# Patient Record
Sex: Male | Born: 1940 | Race: White | Hispanic: No | Marital: Married | Smoking: Never smoker
Health system: Southern US, Community
[De-identification: ages and names within clinical notes are randomized; demographics above are authoritative.]

## PROBLEM LIST (undated history)

## (undated) DIAGNOSIS — G459 Transient cerebral ischemic attack, unspecified: Secondary | ICD-10-CM

## (undated) DIAGNOSIS — I10 Essential (primary) hypertension: Secondary | ICD-10-CM

## (undated) DIAGNOSIS — E119 Type 2 diabetes mellitus without complications: Secondary | ICD-10-CM

## (undated) DIAGNOSIS — I2510 Atherosclerotic heart disease of native coronary artery without angina pectoris: Secondary | ICD-10-CM

## (undated) DIAGNOSIS — E785 Hyperlipidemia, unspecified: Secondary | ICD-10-CM

## (undated) HISTORY — PX: TOE AMPUTATION: SHX809

---
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
## 2004-09-23 DIAGNOSIS — I2510 Atherosclerotic heart disease of native coronary artery without angina pectoris: Secondary | ICD-10-CM

## 2004-09-23 HISTORY — DX: Atherosclerotic heart disease of native coronary artery without angina pectoris: I25.10

## 2004-09-23 HISTORY — PX: BYPASS GRAFT: SHX909

## 2016-07-18 DIAGNOSIS — Z9181 History of falling: Secondary | ICD-10-CM | POA: Insufficient documentation

## 2016-07-18 DIAGNOSIS — E11628 Type 2 diabetes mellitus with other skin complications: Secondary | ICD-10-CM | POA: Insufficient documentation

## 2016-07-18 DIAGNOSIS — E039 Hypothyroidism, unspecified: Secondary | ICD-10-CM | POA: Insufficient documentation

## 2016-07-18 DIAGNOSIS — I10 Essential (primary) hypertension: Secondary | ICD-10-CM | POA: Insufficient documentation

## 2016-07-18 DIAGNOSIS — R202 Paresthesia of skin: Secondary | ICD-10-CM | POA: Insufficient documentation

## 2016-07-18 DIAGNOSIS — E1140 Type 2 diabetes mellitus with diabetic neuropathy, unspecified: Secondary | ICD-10-CM | POA: Insufficient documentation

## 2016-07-18 DIAGNOSIS — Z794 Long term (current) use of insulin: Secondary | ICD-10-CM

## 2016-07-18 DIAGNOSIS — E785 Hyperlipidemia, unspecified: Secondary | ICD-10-CM | POA: Insufficient documentation

## 2016-07-18 DIAGNOSIS — E11319 Type 2 diabetes mellitus with unspecified diabetic retinopathy without macular edema: Secondary | ICD-10-CM

## 2016-07-18 DIAGNOSIS — E119 Type 2 diabetes mellitus without complications: Secondary | ICD-10-CM | POA: Insufficient documentation

## 2016-07-18 DIAGNOSIS — I25810 Atherosclerosis of coronary artery bypass graft(s) without angina pectoris: Secondary | ICD-10-CM | POA: Insufficient documentation

## 2016-08-07 DIAGNOSIS — G5600 Carpal tunnel syndrome, unspecified upper limb: Secondary | ICD-10-CM | POA: Insufficient documentation

## 2016-10-29 DIAGNOSIS — Z7409 Other reduced mobility: Secondary | ICD-10-CM | POA: Insufficient documentation

## 2016-10-29 DIAGNOSIS — R413 Other amnesia: Secondary | ICD-10-CM | POA: Insufficient documentation

## 2016-10-29 DIAGNOSIS — R251 Tremor, unspecified: Secondary | ICD-10-CM | POA: Insufficient documentation

## 2016-10-29 DIAGNOSIS — R1310 Dysphagia, unspecified: Secondary | ICD-10-CM | POA: Insufficient documentation

## 2016-10-29 DIAGNOSIS — M25551 Pain in right hip: Secondary | ICD-10-CM | POA: Insufficient documentation

## 2016-11-18 ENCOUNTER — Ambulatory Visit (INDEPENDENT_AMBULATORY_CARE_PROVIDER_SITE_OTHER): Payer: Medicare Other | Admitting: Podiatry

## 2016-11-18 ENCOUNTER — Encounter: Payer: Self-pay | Admitting: Podiatry

## 2016-11-18 DIAGNOSIS — B351 Tinea unguium: Secondary | ICD-10-CM | POA: Diagnosis not present

## 2016-11-18 DIAGNOSIS — E1149 Type 2 diabetes mellitus with other diabetic neurological complication: Secondary | ICD-10-CM | POA: Diagnosis not present

## 2016-11-18 DIAGNOSIS — M129 Arthropathy, unspecified: Secondary | ICD-10-CM | POA: Diagnosis not present

## 2016-11-18 DIAGNOSIS — M205X9 Other deformities of toe(s) (acquired), unspecified foot: Secondary | ICD-10-CM

## 2016-11-18 DIAGNOSIS — M79676 Pain in unspecified toe(s): Secondary | ICD-10-CM | POA: Diagnosis not present

## 2016-11-18 DIAGNOSIS — M2020 Hallux rigidus, unspecified foot: Secondary | ICD-10-CM

## 2016-11-18 NOTE — Progress Notes (Signed)
This patient presents the office with chief complaint of painful long nails. Patient has moved from [place] and has not been seen by his podiatrist for 6 months. He says that he is unable to work on the nails himself. 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 He also is interested in inquiring about diabetic footgear   GENERAL APPEARANCE: Alert, conversant. Appropriately groomed. No acute distress.  VASCULAR: [facility]  [facility]  [facility] but absent left foot.  Capillary refill time is immediate to all digits,  Normal temperature gradient.   NEUROLOGIC: sensation is absent  to 5.07 monofilament at 5/5 sites bilateral.  Light touch is intact bilateral, Muscle strength normal.  MUSCULOSKELETAL: acceptable muscle strength, tone and stability bilateral.  Intrinsic muscluature intact bilateral.  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  DERMATOLOGIC: skin color, texture, and turgor are within normal limits.  No preulcerative lesions or ulcers  are seen, no interdigital maceration noted.  No open lesions present.  . No drainage noted.  NAILS  Thick disfigured discolored nails both feet.  Onychomycosis  B/L  Diabetes with neuropathy  Hallux Limitus  B/L  Foot arthritis left foot.   IE  Debridement of nails.  Initiate diabetic shoe paperwork.  Patient needs filler or wedge on left orthoses.  RTC 3 months.   Helane GuntherGregory Lorriane Dehart DPM

## 2017-01-03 ENCOUNTER — Telehealth: Payer: Self-pay | Admitting: *Deleted

## 2017-01-03 NOTE — Telephone Encounter (Signed)
Pt states Dr. Stacie Acres spoke to him concerning a man in the office that would help him with diabetic shoes, but he has not heard from anyone.

## 2017-03-10 ENCOUNTER — Ambulatory Visit (INDEPENDENT_AMBULATORY_CARE_PROVIDER_SITE_OTHER): Payer: Medicare Other | Admitting: Podiatry

## 2017-03-10 ENCOUNTER — Encounter: Payer: Self-pay | Admitting: Podiatry

## 2017-03-10 DIAGNOSIS — B351 Tinea unguium: Secondary | ICD-10-CM

## 2017-03-10 DIAGNOSIS — M2020 Hallux rigidus, unspecified foot: Secondary | ICD-10-CM

## 2017-03-10 DIAGNOSIS — M79676 Pain in unspecified toe(s): Secondary | ICD-10-CM | POA: Diagnosis not present

## 2017-03-10 DIAGNOSIS — E1149 Type 2 diabetes mellitus with other diabetic neurological complication: Secondary | ICD-10-CM

## 2017-03-10 DIAGNOSIS — M205X9 Other deformities of toe(s) (acquired), unspecified foot: Secondary | ICD-10-CM

## 2017-03-10 DIAGNOSIS — M129 Arthropathy, unspecified: Secondary | ICD-10-CM

## 2017-03-10 NOTE — Progress Notes (Signed)
This patient presents the office with chief complaint of painful long nails. Patient has moved from [place] and has not been seen by his podiatrist for 6 months. He says that he is unable to work on the nails himself. 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 He also is interested in inquiring about diabetic footgear   GENERAL APPEARANCE: Alert, conversant. Appropriately groomed. No acute distress.  VASCULAR: Pedal [facility]  [facility]  [facility] but absent left foot.  Capillary refill time is immediate to all digits,  Normal temperature gradient.   NEUROLOGIC: sensation is absent  to 5.07 monofilament at 5/5 sites bilateral.  Light touch is intact bilateral, Muscle strength normal.  MUSCULOSKELETAL: acceptable muscle strength, tone and stability bilateral.  Intrinsic muscluature intact bilateral.  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  DERMATOLOGIC: skin color, texture, and turgor are within normal limits.  No preulcerative lesions or ulcers  are seen, no interdigital maceration noted.  No open lesions present.  . No drainage noted.  NAILS  Thick disfigured discolored nails both feet.  Onychomycosis  B/L  Diabetes with neuropathy  Hallux Limitus  B/L  Foot arthritis left foot.   IE  Debridement of nails. Re-  Initiate diabetic shoe paperwork.  Patient needs filler or wedge on left orthoses.  RTC 3 months.   Helane GuntherGregory Presley Gora DPM

## 2017-05-05 ENCOUNTER — Telehealth: Payer: Self-pay | Admitting: Podiatry

## 2017-05-05 NOTE — Telephone Encounter (Signed)
Per voicemail from pt [phone] @ 337pm pt calling to see if his diabetic shoes are in. Please call pt and advise

## 2017-05-08 ENCOUNTER — Ambulatory Visit (INDEPENDENT_AMBULATORY_CARE_PROVIDER_SITE_OTHER): Payer: Medicare Other | Admitting: Podiatry

## 2017-05-08 ENCOUNTER — Encounter: Payer: Self-pay | Admitting: Podiatry

## 2017-05-08 DIAGNOSIS — E1149 Type 2 diabetes mellitus with other diabetic neurological complication: Secondary | ICD-10-CM

## 2017-05-08 DIAGNOSIS — M205X9 Other deformities of toe(s) (acquired), unspecified foot: Secondary | ICD-10-CM

## 2017-05-08 DIAGNOSIS — M2020 Hallux rigidus, unspecified foot: Secondary | ICD-10-CM

## 2017-05-08 NOTE — Progress Notes (Signed)
Patient ID: Chris [name], male   DOB: May 30, 1941, 76 y.o.   MRN: [id number]   Patient presents for diabetic shoe pick up, shoes are tried on for good fit.   Patient is requesting an outer heel wedge on the left shoe.  [facility]  This patient presents to the office to pick up his diabetic shoes.   GENERAL APPEARANCE: Alert, conversant. Appropriately groomed. No acute distress.  VASCULAR: Pedal pulses are  [facility]  [facility].  Capillary refill time is immediate to all digits,  Normal temperature gradient.  Digital hair growth is present bilateral  NEUROLOGIC: sensation is absent  to 5.07 monofilament at 5/5 sites bilateral.  Light touch is absent  bilateral, Muscle strength normal.  MUSCULOSKELETAL: acceptable muscle strength, tone and stability bilateral.  Intrinsic muscluature intact bilateral.  Hallux limitus 1st MPJ  B/L.  Liz-Frank deformity. NAILS  Thick disfigured discolored nails both feet. DERMATOLOGIC: skin color, texture, and turgor are within normal limits.  No preulcerative lesions or ulcers  are seen, no interdigital maceration noted.  No open lesions present.  Digital nails are asymptomatic. No drainage noted.   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  Patient was brought into the office and the shoes fit well.  He desires an outer heel wedge to be applied to one of his shoes.  We will discuss this with Raiford Nobleick.  Patient be reexamined in one week.   Helane GuntherGregory Euleta Belson DPM

## 2017-05-08 NOTE — Patient Instructions (Signed)

## 2017-06-09 ENCOUNTER — Ambulatory Visit (INDEPENDENT_AMBULATORY_CARE_PROVIDER_SITE_OTHER): Payer: Medicare Other | Admitting: Podiatry

## 2017-06-09 ENCOUNTER — Encounter: Payer: Self-pay | Admitting: Podiatry

## 2017-06-09 ENCOUNTER — Ambulatory Visit (INDEPENDENT_AMBULATORY_CARE_PROVIDER_SITE_OTHER): Payer: Medicare Other

## 2017-06-09 DIAGNOSIS — E08621 Diabetes mellitus due to underlying condition with foot ulcer: Secondary | ICD-10-CM

## 2017-06-09 DIAGNOSIS — L97421 Non-pressure chronic ulcer of left heel and midfoot limited to breakdown of skin: Secondary | ICD-10-CM | POA: Diagnosis not present

## 2017-06-09 DIAGNOSIS — E1142 Type 2 diabetes mellitus with diabetic polyneuropathy: Secondary | ICD-10-CM

## 2017-06-09 DIAGNOSIS — L97521 Non-pressure chronic ulcer of other part of left foot limited to breakdown of skin: Secondary | ICD-10-CM | POA: Diagnosis not present

## 2017-06-09 MED ORDER — CEPHALEXIN 500 MG PO CAPS: 500.0000 mg | ORAL_CAPSULE | Freq: Two times a day (BID) | ORAL | 2 refills | Status: DC

## 2017-06-09 NOTE — Progress Notes (Addendum)
This patient presents the office with chief complaint of a newly developed ulcer on the bottom of his left foot.  This area is under the fifth meta-base left foot.  He says this is a new wound that has been present for approximately a week and has order and slight drainage.  He presents the office today walking with a bandage on his left foot.  He gives a history of having an infection years ago when he was in [place].  This is led to multiple surgeries and he states that part of his fifth metatarsal bone was removed.  He says since that surgery he has had a breakdown of the skin under the fifth meta-base, which is taken months to years to close.  He says he was even treated at the wound care center.  He presents the office today for an evaluation of this newly developed diabetic ulcer.  GENERAL APPEARANCE: Alert, conversant. Appropriately groomed. No acute distress.  VASCULAR: Pedal pulses are  palpable at  [facility] and PT bilateral.  Capillary refill time is immediate to all digits,  Normal temperature gradient.  Digital hair growth is present bilateral  NEUROLOGIC: sensation is absent  to 5.07 monofilament at 5/5 sites bilateral.  Light touch is intact bilateral, Muscle strength normal.  MUSCULOSKELETAL: acceptable muscle strength, tone and stability bilateral.  Intrinsic muscluature intact bilateral.  Hallux limitus first MPJ bilaterally.  Marisue Ivan frank deformity. Ulcer  patient has a 10 mm x 15 mm ulcer, sub-fifth metatarsal left foot.  This ulcer is encircled by white necrotic tissue.  No evidence of any redness, swelling or drainage from the ulcer site. DERMATOLOGIC: skin color, texture, and turgor are within normal limits.  No preulcerative lesions or ulcers  are seen, no interdigital maceration noted.  No open lesions present.  Digital nails are asymptomatic. No drainage noted.  Diabetic ulcer left foot.  Diabetic neuropathy   ROV  debridement of necrotic tissue at the diabetic ulcer site was performed.   Silvadene dry sterile dressing was applied.  X-ray was taken which reveal the absence of the distal shaft fifth metatarsal in the absence of the fourth metatarsal head.   No evidence of any bony changes indicating osteomyelitis.  Calcification at insertion plantar fascia and achilles tendon.   Midfoot arthritic changes. Patient normally would be told to return to the office in one week for follow-up, but he is going on vacation and will not be back until October.  The wife says she will take care of the foot until he returns.  Prescribe cephalexin 1 twice a day with 2 refills. RTC prn   Helane Gunther DPM

## 2017-06-09 NOTE — Patient Instructions (Signed)
.  gamsoaks

## 2017-06-09 NOTE — Addendum Note (Signed)
Addended by: Geraldine Contras D on: 06/09/2017 10:35 AM   Modules accepted: Orders

## 2017-06-26 ENCOUNTER — Ambulatory Visit (INDEPENDENT_AMBULATORY_CARE_PROVIDER_SITE_OTHER): Payer: Medicare Other | Admitting: Podiatry

## 2017-06-26 DIAGNOSIS — L97421 Non-pressure chronic ulcer of left heel and midfoot limited to breakdown of skin: Secondary | ICD-10-CM | POA: Diagnosis not present

## 2017-06-26 DIAGNOSIS — E1142 Type 2 diabetes mellitus with diabetic polyneuropathy: Secondary | ICD-10-CM

## 2017-06-26 DIAGNOSIS — E08621 Diabetes mellitus due to underlying condition with foot ulcer: Secondary | ICD-10-CM

## 2017-06-26 MED ORDER — SILVER SULFADIAZINE 1 % EX CREA: 1.0000 "application " | TOPICAL_CREAM | Freq: Every day | CUTANEOUS | 0 refills | Status: DC

## 2017-06-26 NOTE — Progress Notes (Signed)
This patient presents the office with chief complaint of a newly developed ulcer on the bottom of his left foot.  This area is under the fifth meta-base left foot. This wound has been opened and present for approximately 3 weeks.  He says he was on vacation and did significant amount of driving, which kept him from ambulating on his left foot.  He says he has been bandaging his foot and soaking his foot has recommended.  He has taken his cephalexin as prescribed. He presents to the office today stating that there seems to be improvement and that this is only scant amount of drainage.  He states he has no pain in the morning but pain occurs by the end of the day after standing and walking on his left foot.  GENERAL APPEARANCE: Alert, conversant. Appropriately groomed. No acute distress.  VASCULAR: Pedal pulses are  palpable at  [facility] and PT bilateral.  Capillary refill time is immediate to all digits,  Normal temperature gradient.  Digital hair growth is present bilateral  NEUROLOGIC: sensation is absent  to 5.07 monofilament at 5/5 sites bilateral.  Light touch is intact bilateral, Muscle strength normal.  MUSCULOSKELETAL: acceptable muscle strength, tone and stability bilateral.  Intrinsic muscluature intact bilateral.  Hallux limitus first MPJ bilaterally.  Marisue Ivan frank deformity. Ulcer  patient has a 8 mm. X 6 mm.  ulcer, sub-fifth metatarsal left foot.  This ulcer is encircled by white necrotic tissue.  No evidence of any redness, swelling or drainage from the ulcer site. The granulation has filled in and the ulcer is looking clean today DERMATOLOGIC: skin color, texture, and turgor are within normal limits.  No preulcerative lesions or ulcers  are seen, no interdigital maceration noted.  No open lesions present.  Digital nails are asymptomatic. No drainage noted.  Diabetic ulcer left foot.  Diabetic neuropathy   ROV  debridement of necrotic tissue at the diabetic ulcer site was performed.  Silvadene dry  sterile dressing was applied.  .  Patient was prescribed Silvadene for home application.  He was told to continue his soaks and limit his activity to allow the ulcer to continue to heal. He is to return to the office in 3 weeks for further evaluation and treatment.  No evidence of infection so he was not told to take any further cephalexin by mouth   Helane Gunther DPM

## 2017-07-17 ENCOUNTER — Ambulatory Visit (INDEPENDENT_AMBULATORY_CARE_PROVIDER_SITE_OTHER): Payer: Medicare Other | Admitting: Podiatry

## 2017-07-17 ENCOUNTER — Encounter: Payer: Self-pay | Admitting: Podiatry

## 2017-07-17 VITALS — BP 152/67 | HR 62

## 2017-07-17 DIAGNOSIS — E08621 Diabetes mellitus due to underlying condition with foot ulcer: Secondary | ICD-10-CM | POA: Diagnosis not present

## 2017-07-17 DIAGNOSIS — E1142 Type 2 diabetes mellitus with diabetic polyneuropathy: Secondary | ICD-10-CM

## 2017-07-17 DIAGNOSIS — L97421 Non-pressure chronic ulcer of left heel and midfoot limited to breakdown of skin: Secondary | ICD-10-CM

## 2017-07-17 DIAGNOSIS — L97521 Non-pressure chronic ulcer of other part of left foot limited to breakdown of skin: Secondary | ICD-10-CM

## 2017-07-17 NOTE — Progress Notes (Signed)
This patient presents the office with   GENERAL APPEARANCE: Alert, conversant. Appropriately groomed. No acute distress.  VASCULAR: Pedal pulses are  [facility]  [facility].  Capillary refill time is immediate to all digits,  Normal temperature gradient.  Digital hair growth is present bilateral  NEUROLOGIC: sensation is absent  to 5.07 monofilament at 5/5 sites bilateral.  Light touch is intact bilateral, Muscle strength normal.  MUSCULOSKELETAL: acceptable muscle strength, tone and stability bilateral.  Intrinsic muscluature intact bilateral.  Hallux limitus first MPJ bilaterally.  Marisue IvanLiz frank deformity. Ulcer  patient has a 3  mm. X 5 mm. ulcer, sub-fifth metatarsal left foot.  This ulcer is encircled by callus tissue.  No evidence of any redness, swelling . The granulation has filled in and the ulcer is looking clean today.  Minimal drainage. DERMATOLOGIC: skin color, texture, and turgor are within normal limits.  No preulcerative lesions or ulcers  are seen, no interdigital maceration noted.  No open lesions present.  Digital nails are asymptomatic. No drainage noted.  Diabetic ulcer left foot.  Diabetic neuropathy   ROV  debridement of necrotic tissue at the diabetic ulcer site was performed. Neosporin/DSD.  Continue home wound care.  RTC 3 weeks.  Helane GuntherGregory Evamaria Detore DPM

## 2017-08-07 ENCOUNTER — Ambulatory Visit: Payer: Medicare Other | Admitting: Podiatry

## 2017-08-11 ENCOUNTER — Ambulatory Visit (INDEPENDENT_AMBULATORY_CARE_PROVIDER_SITE_OTHER): Payer: Medicare Other | Admitting: Podiatry

## 2017-08-11 ENCOUNTER — Encounter: Payer: Self-pay | Admitting: Podiatry

## 2017-08-11 DIAGNOSIS — E1142 Type 2 diabetes mellitus with diabetic polyneuropathy: Secondary | ICD-10-CM

## 2017-08-11 DIAGNOSIS — L97421 Non-pressure chronic ulcer of left heel and midfoot limited to breakdown of skin: Secondary | ICD-10-CM

## 2017-08-11 DIAGNOSIS — E08621 Diabetes mellitus due to underlying condition with foot ulcer: Secondary | ICD-10-CM | POA: Diagnosis not present

## 2017-08-11 NOTE — Progress Notes (Signed)
This patient presents the office for treatment of diabetic ulcer left foot.  GENERAL APPEARANCE: Alert, conversant. Appropriately groomed. No acute distress.  VASCULAR: Pedal pulses are  palpable at  [facility].  Capillary refill time is immediate to all digits,  Normal temperature gradient.  Digital hair growth is present bilateral  NEUROLOGIC: sensation is absent  to 5.07 monofilament at 5/5 sites bilateral.  Light touch is intact bilateral, Muscle strength normal.  MUSCULOSKELETAL: acceptable muscle strength, tone and stability bilateral.  Intrinsic muscluature intact bilateral.  Hallux limitus first MPJ bilaterally.  Marisue IvanLiz frank deformity. Ulcer  patient has a 3  mm. X 5 mm. ulcer, sub-fifth metatarsal left foot.  This ulcer is encircled by callus tissue.  No evidence of any redness, swelling . The granulation has filled in and the ulcer is looking clean today.  Minimal drainage. DERMATOLOGIC: skin color, texture, and turgor are within normal limits.  No preulcerative lesions or ulcers  are seen, no interdigital maceration noted.  No open lesions present.  Digital nails are asymptomatic. No drainage noted.  Diabetic ulcer left foot.  Diabetic neuropathy   ROV  debridement of necrotic tissue at the diabetic ulcer site was performed. Neosporin/DSD.  Continue home wound care.  RTC 3 weeks.  Helane GuntherGregory Sebastiana Wuest DPM

## 2017-09-01 ENCOUNTER — Ambulatory Visit: Payer: Medicare Other | Admitting: Podiatry

## 2017-09-11 ENCOUNTER — Ambulatory Visit (INDEPENDENT_AMBULATORY_CARE_PROVIDER_SITE_OTHER): Payer: Medicare Other | Admitting: Podiatry

## 2017-09-11 ENCOUNTER — Encounter: Payer: Self-pay | Admitting: Podiatry

## 2017-09-11 DIAGNOSIS — E1142 Type 2 diabetes mellitus with diabetic polyneuropathy: Secondary | ICD-10-CM

## 2017-09-11 NOTE — Progress Notes (Addendum)
This patient presents the office for treatment of diabetic ulcer left foot. He does admit having bleeding which was due to an injury.  He has been soaking and bandaging and usingf silvadene.  GENERAL APPEARANCE: Alert, conversant. Appropriately groomed. No acute distress.  VASCULAR: Pedal [facility] are  [facility] at  [facility].  Capillary refill time is immediate to all digits,  Normal temperature gradient.  Digital hair growth is present bilateral  NEUROLOGIC: sensation is absent  to 5.07 monofilament at 5/5 sites bilateral.  Light touch is intact bilateral, Muscle strength normal.  MUSCULOSKELETAL: acceptable muscle strength, tone and stability bilateral.  Intrinsic muscluature intact bilateral.  Hallux limitus first MPJ bilaterally.  Marisue IvanLiz frank deformity. Ulcer  patient has  2 mm. X 1 mm. ulcer, sub-fifth metatarsal left foot.  This ulcer is encircled by callus tissue.  No evidence of any redness, swelling . The granulation has filled in and the ulcer is looking clean today.  Minimal drainage. DERMATOLOGIC: skin color, texture, and turgor are within normal limits.  No preulcerative lesions or ulcers  are seen, no interdigital maceration noted.  No open lesions present.  Digital nails are asymptomatic. No drainage noted. Asymptomatic skin necrosis left hallux.  Diabetic ulcer left foot.  Diabetic neuropathy   ROV  debridement of necrotic tissue at the diabetic ulcer site was performed. Neosporin/DSD.  Continue home wound care.  RTC 4 weeks.  Helane GuntherGregory Nashton Belson DPM

## 2017-10-16 ENCOUNTER — Ambulatory Visit (INDEPENDENT_AMBULATORY_CARE_PROVIDER_SITE_OTHER): Payer: Medicare Other | Admitting: Podiatry

## 2017-10-16 ENCOUNTER — Encounter: Payer: Self-pay | Admitting: Podiatry

## 2017-10-16 DIAGNOSIS — B351 Tinea unguium: Secondary | ICD-10-CM

## 2017-10-16 DIAGNOSIS — E1142 Type 2 diabetes mellitus with diabetic polyneuropathy: Secondary | ICD-10-CM | POA: Diagnosis not present

## 2017-10-16 DIAGNOSIS — L97421 Non-pressure chronic ulcer of left heel and midfoot limited to breakdown of skin: Secondary | ICD-10-CM

## 2017-10-16 DIAGNOSIS — E08621 Diabetes mellitus due to underlying condition with foot ulcer: Secondary | ICD-10-CM

## 2017-10-16 DIAGNOSIS — M79676 Pain in unspecified toe(s): Secondary | ICD-10-CM

## 2017-10-16 NOTE — Progress Notes (Signed)
This patient presents the office for treatment of diabetic ulcer left foot. He does admit there is continued drainage ,very scant.  He has been soaking and bandaging and using silvadene. Patient says nails on right foot are thick and painful.  GENERAL APPEARANCE: Alert, conversant. Appropriately groomed. No acute distress.  VASCULAR: Pedal pulses are  palpable at  [facility].  Capillary refill time is immediate to all digits,  Normal temperature gradient.   NEUROLOGIC: sensation is absent  to 5.07 monofilament at 5/5 sites bilateral.  Light touch is intact bilateral, Muscle strength normal.  MUSCULOSKELETAL: acceptable muscle strength, tone and stability bilateral.  Intrinsic muscluature intact bilateral.  Hallux limitus first MPJ bilaterally.  Marisue IvanLiz frank deformity. Ulcer  patient has  Is pinhole size.. ulcer, sub-fifth metabase fifth metatarsal left foot. left foot.  This ulcer is encircled by callus tissue.  No evidence of any redness, swelling . The granulation has filled in and the ulcer is looking clean today.  Minimal drainage. DERMATOLOGIC: skin color, texture, and turgor are within normal limits.  No preulcerative lesions or ulcers  are seen, no interdigital maceration noted.  No open lesions present.  Digital nails are asymptomatic. No drainage noted. Asymptomatic skin necrosis left hallux. NAILS  Thick disfigured discolored nails right foot.   Diabetic ulcer left foot.  Diabetic neuropathy  Onychomycosis  Right foot.   ROV  debridement of necrotic tissue at the diabetic ulcer site was performed. Neosporin/DSD.  Continue home wound care.  RTC 4 weeks.  Helane GuntherGregory Jaydah Stahle DPM

## 2017-11-13 ENCOUNTER — Encounter: Payer: Self-pay | Admitting: Podiatry

## 2017-11-13 ENCOUNTER — Ambulatory Visit (INDEPENDENT_AMBULATORY_CARE_PROVIDER_SITE_OTHER): Payer: Medicare Other | Admitting: Podiatry

## 2017-11-13 DIAGNOSIS — L97421 Non-pressure chronic ulcer of left heel and midfoot limited to breakdown of skin: Secondary | ICD-10-CM

## 2017-11-13 DIAGNOSIS — E08621 Diabetes mellitus due to underlying condition with foot ulcer: Secondary | ICD-10-CM | POA: Diagnosis not present

## 2017-11-13 DIAGNOSIS — E1142 Type 2 diabetes mellitus with diabetic polyneuropathy: Secondary | ICD-10-CM | POA: Diagnosis not present

## 2017-11-13 NOTE — Progress Notes (Signed)
This patient presents the office for treatment of diabetic ulcer left foot. He states that there is no pain or drainage coming from the diabetic ulcer left foot.  He admits he has stopped wearing a bandage on the ulcer.  GENERAL APPEARANCE: Alert, conversant. Appropriately groomed. No acute distress.  VASCULAR: Pedal [facility] are  [facility]  [facility].  Capillary refill time is immediate to all digits,  Normal temperature gradient.   NEUROLOGIC: sensation is absent  to 5.07 monofilament at 5/5 sites bilateral.  Light touch is intact bilateral, Muscle strength normal.  MUSCULOSKELETAL: acceptable muscle strength, tone and stability bilateral.  Intrinsic muscluature intact bilateral.  Hallux limitus first MPJ bilaterally.  Marisue IvanLiz frank deformity. Ulcer  Healing has occurred with no evidence of drainage.  The ulcer has closed.. DERMATOLOGIC: skin color, texture, and turgor are within normal limits.  No preulcerative lesions or ulcers  are seen, no interdigital maceration noted.  No open lesions present.  Digital nails are asymptomatic. No drainage noted. Asymptomatic skin necrosis left hallux. NAILS  Thick disfigured discolored nails right foot.   Healed diabetic ulcer left foot.   ROV  Examined. Diabetic ulcer and healing has been complete.  The ulcer has filled in with normal appearing skin.  A dremel  tool was utilized to de bride any superficial flaking skin.  Patient was instructed to return to the office every 10 weeks for nail and wound care.  Helane GuntherGregory Tyrrell Stephens DPM

## 2018-01-22 ENCOUNTER — Encounter: Payer: Self-pay | Admitting: Podiatry

## 2018-01-22 ENCOUNTER — Ambulatory Visit (INDEPENDENT_AMBULATORY_CARE_PROVIDER_SITE_OTHER): Payer: Medicare Other | Admitting: Podiatry

## 2018-01-22 DIAGNOSIS — E1142 Type 2 diabetes mellitus with diabetic polyneuropathy: Secondary | ICD-10-CM

## 2018-01-22 DIAGNOSIS — E08621 Diabetes mellitus due to underlying condition with foot ulcer: Secondary | ICD-10-CM

## 2018-01-22 DIAGNOSIS — L97421 Non-pressure chronic ulcer of left heel and midfoot limited to breakdown of skin: Secondary | ICD-10-CM

## 2018-01-22 NOTE — Progress Notes (Signed)
This patient presents the office for treatment of diabetic ulcer left foot. He states that there is no pain or drainage coming from the diabetic ulcer left foot.  He admits he has stopped wearing a bandage on the ulcer. He states he is feeling no pain.  He has toe bites from his dog.  Skin breaks on legs due to yard work.  GENERAL APPEARANCE: Alert, conversant. Appropriately groomed. No acute distress.  VASCULAR: Pedal pulses are  palpable at  [facility] and PT bilateral.  Capillary refill time is immediate to all digits,  Normal temperature gradient.   NEUROLOGIC: sensation is absent  to 5.07 monofilament at 5/5 sites bilateral.  Light touch is intact bilateral, Muscle strength normal.  MUSCULOSKELETAL: acceptable muscle strength, tone and stability bilateral.  Intrinsic muscluature intact bilateral.  Hallux limitus first MPJ bilaterally.  Marisue Ivan frank deformity. Ulcer  Healing has occurred with no evidence of drainage.  The ulcer has closed.. DERMATOLOGIC: skin color, texture, and turgor are within normal limits.  No preulcerative lesions or ulcers  are seen, no interdigital maceration noted.  No open lesions present.  Digital nails are asymptomatic. No drainage noted. Asymptomatic skin necrosis left hallux. NAILS  Thick disfigured discolored nails right foot.   Healed diabetic ulcer left foot.   ROV  Examined. Diabetic ulcer and healing has been complete.  The ulcer has filled in with normal appearing skin.  A dremel  tool was utilized to de bride any superficial flaking skin.  Patient was instructed to return to the office every 6 weeks for nail and wound care.  Helane Gunther DPM

## 2018-03-05 ENCOUNTER — Ambulatory Visit (INDEPENDENT_AMBULATORY_CARE_PROVIDER_SITE_OTHER): Payer: Medicare Other | Admitting: Podiatry

## 2018-03-05 ENCOUNTER — Encounter: Payer: Self-pay | Admitting: Podiatry

## 2018-03-05 DIAGNOSIS — E1142 Type 2 diabetes mellitus with diabetic polyneuropathy: Secondary | ICD-10-CM

## 2018-03-05 DIAGNOSIS — E08621 Diabetes mellitus due to underlying condition with foot ulcer: Secondary | ICD-10-CM

## 2018-03-05 DIAGNOSIS — L97421 Non-pressure chronic ulcer of left heel and midfoot limited to breakdown of skin: Secondary | ICD-10-CM

## 2018-03-05 NOTE — Progress Notes (Signed)
This patient presents the office for treatment of diabetic ulcer left foot. He states that there is no pain or drainage coming from the diabetic ulcer left foot.  He admits he has stopped wearing a bandage on the ulcer. He states he is feeling no pain.  He presents for continued evaluation and treatment of this callus.  GENERAL APPEARANCE: Alert, conversant. Appropriately groomed. No acute distress.  VASCULAR: Pedal pulses are  palpable at  [facility].  Capillary refill time is immediate to all digits,  Normal temperature gradient.   NEUROLOGIC: sensation is absent  to 5.07 monofilament at 5/5 sites bilateral.  Light touch is intact bilateral, Muscle strength normal.  MUSCULOSKELETAL: acceptable muscle strength, tone and stability bilateral.  Intrinsic muscluature intact bilateral.  Hallux limitus first MPJ bilaterally.  Marisue IvanLiz frank deformity. Ulcer  Healing has occurred with no evidence of drainage.  The ulcer has closed.. DERMATOLOGIC: skin color, texture, and turgor are within normal limits.  No preulcerative lesions or ulcers  are seen, no interdigital maceration noted.  No open lesions present.  Digital nails are asymptomatic. No drainage noted. Asymptomatic skin necrosis left hallux. NAILS  Thick disfigured discolored nails right foot.   Healed diabetic ulcer left foot.   ROV  Examined. Diabetic ulcer and healing has been complete.  The ulcer has filled in with normal appearing skin.  A dremel  tool was utilized to de bride any superficial flaking skin.  Patient was instructed to return to the office every 10  weeks for nail and wound care.  Helane GuntherGregory Luticia Tadros DPM

## 2018-05-14 ENCOUNTER — Encounter: Payer: Self-pay | Admitting: Podiatry

## 2018-05-14 ENCOUNTER — Ambulatory Visit (INDEPENDENT_AMBULATORY_CARE_PROVIDER_SITE_OTHER): Payer: Medicare Other | Admitting: Podiatry

## 2018-05-14 DIAGNOSIS — L84 Corns and callosities: Secondary | ICD-10-CM | POA: Diagnosis not present

## 2018-05-14 DIAGNOSIS — E1142 Type 2 diabetes mellitus with diabetic polyneuropathy: Secondary | ICD-10-CM | POA: Diagnosis not present

## 2018-05-14 NOTE — Progress Notes (Signed)
This patient presents the office for evaluation  of diabetic ulcer left foot. He states that there is no pain or drainage coming from the diabetic ulcer left foot.  He admits he has stopped wearing a bandage on the ulcer. He states he is feeling no pain.during the day but pain during sleep.  He presents for continued evaluation and treatment of this callus.  GENERAL APPEARANCE: Alert, conversant. Appropriately groomed. No acute distress.  VASCULAR: Pedal [facility] are  [facility]  [facility].  Capillary refill time is immediate to all digits,  Normal temperature gradient.   NEUROLOGIC: sensation is absent  to 5.07 monofilament at 5/5 sites bilateral.  Light touch is intact bilateral, Muscle strength normal.  MUSCULOSKELETAL: acceptable muscle strength, tone and stability bilateral.  Intrinsic muscluature intact bilateral.  Hallux limitus first MPJ bilaterally.  Marisue IvanLiz frank deformity. Ulcer  Healing has occurred with no evidence of drainage.  The ulcer has closed.. DERMATOLOGIC: skin color, texture, and turgor are within normal limits.  No preulcerative lesions or ulcers  are seen, no interdigital maceration noted.  No open lesions present.  Digital nails are asymptomatic. No drainage noted. Asymptomatic skin necrosis left hallux. NAILS  Thick disfigured discolored nails right foot.   Healed diabetic ulcer left foot.   ROV  Examined. Diabetic ulcer and healing has been complete.  The ulcer has filled in with normal appearing skin.  A dremel  tool was utilized to de bride any superficial flaking skin.  Patient was instructed to return to the office every 9  weeks for nail and wound care.  Helane GuntherGregory Caydence Enck DPM

## 2018-07-23 ENCOUNTER — Ambulatory Visit (INDEPENDENT_AMBULATORY_CARE_PROVIDER_SITE_OTHER): Payer: Medicare Other | Admitting: Podiatry

## 2018-07-23 ENCOUNTER — Encounter: Payer: Self-pay | Admitting: Podiatry

## 2018-07-23 DIAGNOSIS — E1142 Type 2 diabetes mellitus with diabetic polyneuropathy: Secondary | ICD-10-CM

## 2018-07-23 DIAGNOSIS — M79676 Pain in unspecified toe(s): Secondary | ICD-10-CM

## 2018-07-23 DIAGNOSIS — B351 Tinea unguium: Secondary | ICD-10-CM | POA: Diagnosis not present

## 2018-07-23 NOTE — Progress Notes (Signed)
This patient presents the office with chief complaint of painful long nails. Patient has had an ulcer on his left foot which is 100 % better.  His nails are painful walking and wearing his shoes. He presents for preventative foot care services.   GENERAL APPEARANCE: Alert, conversant. Appropriately groomed. No acute distress.  VASCULAR: Pedal pulses are  palpable at  [facility] and PT right foot but absent left foot.  Capillary refill time is immediate to all digits,  Normal temperature gradient.   NEUROLOGIC: sensation is absent  to 5.07 monofilament at 5/5 sites bilateral.  Light touch is intact bilateral, Muscle strength normal.  MUSCULOSKELETAL: acceptable muscle strength, tone and stability bilateral.  Intrinsic muscluature intact bilateral.  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  DERMATOLOGIC: skin color, texture, and turgor are within normal limits.  No preulcerative lesions or ulcers  are seen, no interdigital maceration noted.  No open lesions present.  . No drainage noted.  NAILS  Thick disfigured discolored nails both feet.  Onychomycosis  B/L  Diabetes with neuropathy  Hallux Limitus  B/L  Foot arthritis left foot.   Debridement of nails  B/L.[name]  RTC 3 months.   Helane Gunther DPM

## 2018-10-08 ENCOUNTER — Encounter: Payer: Self-pay | Admitting: Podiatry

## 2018-10-08 ENCOUNTER — Ambulatory Visit (INDEPENDENT_AMBULATORY_CARE_PROVIDER_SITE_OTHER): Payer: Medicare Other | Admitting: Podiatry

## 2018-10-08 DIAGNOSIS — B351 Tinea unguium: Secondary | ICD-10-CM | POA: Diagnosis not present

## 2018-10-08 DIAGNOSIS — M79676 Pain in unspecified toe(s): Secondary | ICD-10-CM | POA: Diagnosis not present

## 2018-10-08 DIAGNOSIS — L84 Corns and callosities: Secondary | ICD-10-CM | POA: Diagnosis not present

## 2018-10-08 DIAGNOSIS — E1142 Type 2 diabetes mellitus with diabetic polyneuropathy: Secondary | ICD-10-CM

## 2018-10-08 NOTE — Progress Notes (Signed)
This patient presents the office with chief complaint of painful long nails. Patient has had an ulcer on his left foot which is 100 % better.  His nails are painful walking and wearing his shoes. He presents for preventative foot care services.  He also says he has developed a blood lesion noted on the tip of his right big toe.  He says he has no history of trauma or injury to his big toe.  He says his wife noticed the skin lesion this past Tuesday - 2 days ago.  He denies any drainage.     GENERAL APPEARANCE: Alert, conversant. Appropriately groomed. No acute distress.  VASCULAR: Pedal pulses are  palpable at  [facility] and PT right foot but absent left foot.  Capillary refill time is immediate to all digits,  Normal temperature gradient.   NEUROLOGIC: sensation is absent  to 5.07 monofilament at 5/5 sites bilateral.  Light touch is absent  bilateral, Muscle strength normal.  MUSCULOSKELETAL: acceptable muscle strength, tone and stability bilateral.  Intrinsic muscluature intact bilateral.  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  DERMATOLOGIC: skin color, texture, and turgor are within normal limits. Healed ulcer sub 5th metatabase left foot.  Right hallux has skin necrosis noted with no evidence of infection or drainage noted.  Blister callus noted tip of left hallux.    NAILS  Thick disfigured discolored nails both feet.  Onychomycosis  B/L  Diabetes with neuropathy  Hallux Limitus  B/L  Skin necrosis right hallux.     Debridement of nails  B/L.[name] Discussed this condition of skin necrosis with this patient.  Told the patient I was concerned that he may be having microvascular changes which has led to this development.  There is no redness swelling or drainage noted at the site of the skin necrosis.  Told this patient to watch this area and return to the office if it worsens.  He was also dispensed padding to cover the tips of both hallux bilaterally.  RTC 3 months. For preventative  foot care services.  RTC 4 weeks to check on skin necrosis.  If skin necrosis worsens he should make earlier appointment.   Helane Gunther DPM

## 2018-10-22 ENCOUNTER — Ambulatory Visit: Payer: Medicare Other | Admitting: Podiatry

## 2018-11-02 ENCOUNTER — Encounter: Payer: Self-pay | Admitting: Podiatry

## 2018-11-02 ENCOUNTER — Ambulatory Visit (INDEPENDENT_AMBULATORY_CARE_PROVIDER_SITE_OTHER): Payer: Medicare Other | Admitting: Podiatry

## 2018-11-02 DIAGNOSIS — L84 Corns and callosities: Secondary | ICD-10-CM

## 2018-11-02 DIAGNOSIS — L97501 Non-pressure chronic ulcer of other part of unspecified foot limited to breakdown of skin: Secondary | ICD-10-CM

## 2018-11-02 DIAGNOSIS — E1142 Type 2 diabetes mellitus with diabetic polyneuropathy: Secondary | ICD-10-CM | POA: Diagnosis not present

## 2018-11-02 NOTE — Progress Notes (Signed)
This patient returns to the office for an evaluation of his right big toe.  Patient states that the skin has broken down on the tip of the right big toe.   He believes this is broken down due to excessive motion of his foot and his shoes.  Patient was seen in January 2020 and diagnosed with skin necrosis of the right hallux.  He presents the office today for an evaluation of this skin lesion right foot. Patient has history of left foot surgery for excision 4th metatarsal head and distal shaft fifth metatarsal left foot  General Appearance  Alert, conversant and in no acute stress.  Vascular  Dorsalis pedis and posterior tibial  pulses are palpable  bilaterally.  Capillary return is within normal limits  bilaterally. Temperature is within normal limits  bilaterally.  Neurologic  Senn-Weinstein monofilament wire test absent   bilaterally. Muscle power within normal limits bilaterally.  Nails Thick disfigured discolored nails with subungual debris  from hallux to fifth toes bilaterally. No evidence of bacterial infection or drainage bilaterally.  Orthopedic  No limitations of motion  feet .  No crepitus or effusions noted.  Hallux limitus 1st MPJ B/L.  Liz-Frank arthritis left foot.    Skin  normotropic skin with no porokeratosis noted bilaterally.  Hyperkeratotic lesion on distal aspect left hallux.  Skin lesion about 5 mm. X 5 mm. On left hallux.    Skin lesion right hallux.  ROV.  Examination of his right hallux reveals that the skin lesion is healing with no evidence of any redness swelling or infection.  He has also developed new excoriations on his left hallux dorsally.  I am concerned about his foot wear and recommended he be evaluated by Raiford Noble for new diabetic foot wear.  Patient was dispensed padding and told to wear soft upper shoes or even slippers.  Return to the clinic as previously scheduled for preventative foot care services.  Helane Gunther DPM

## 2018-11-11 ENCOUNTER — Ambulatory Visit: Payer: Medicare Other | Admitting: Orthotics

## 2018-11-11 DIAGNOSIS — L97501 Non-pressure chronic ulcer of other part of unspecified foot limited to breakdown of skin: Secondary | ICD-10-CM

## 2018-11-11 DIAGNOSIS — E08621 Diabetes mellitus due to underlying condition with foot ulcer: Secondary | ICD-10-CM

## 2018-11-11 DIAGNOSIS — E1142 Type 2 diabetes mellitus with diabetic polyneuropathy: Secondary | ICD-10-CM

## 2018-11-11 DIAGNOSIS — L97421 Non-pressure chronic ulcer of left heel and midfoot limited to breakdown of skin: Secondary | ICD-10-CM

## 2018-11-11 DIAGNOSIS — L84 Corns and callosities: Secondary | ICD-10-CM

## 2018-11-11 NOTE — Progress Notes (Signed)

## 2018-11-16 ENCOUNTER — Encounter: Payer: Self-pay | Admitting: Podiatry

## 2018-11-16 ENCOUNTER — Ambulatory Visit (INDEPENDENT_AMBULATORY_CARE_PROVIDER_SITE_OTHER): Payer: Medicare Other | Admitting: Podiatry

## 2018-11-16 DIAGNOSIS — B351 Tinea unguium: Secondary | ICD-10-CM | POA: Diagnosis not present

## 2018-11-16 DIAGNOSIS — M79676 Pain in unspecified toe(s): Secondary | ICD-10-CM | POA: Diagnosis not present

## 2018-11-16 DIAGNOSIS — L97501 Non-pressure chronic ulcer of other part of unspecified foot limited to breakdown of skin: Secondary | ICD-10-CM

## 2018-11-16 DIAGNOSIS — E1142 Type 2 diabetes mellitus with diabetic polyneuropathy: Secondary | ICD-10-CM

## 2018-11-16 NOTE — Progress Notes (Signed)
This patient presents the office with chief complaint of painful long nails. Patient has had an ulcer on his left foot which is 100 % better.  His nails are painful walking and wearing his shoes. He presents for preventative foot care services.  He also says he has developed an open wound  on the tip of his right big toe.  He says he has no history of trauma or injury to his big toe.  He says his wife noticed the skin lesion 3 weeks ago.  He has not been bandaging or soaking his toe.Chris [name]  He denies any drainage. He presents to the office for preventive foot care services.    GENERAL APPEARANCE: Alert, conversant. Appropriately groomed. No acute distress.  VASCULAR: Pedal pulses are  palpable at  [facility] and PT right foot but absent left foot.  Capillary refill time is immediate to all digits,  Normal temperature gradient.   NEUROLOGIC: sensation is absent  to 5.07 monofilament at 5/5 sites bilateral.  Light touch is absent  bilateral, Muscle strength normal.  MUSCULOSKELETAL: acceptable muscle strength, tone and stability bilateral.  Intrinsic muscluature intact bilateral.  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  DERMATOLOGIC: skin color, texture, and turgor are within normal limits. Healed ulcer sub 5th metatabase left foot.  Previous blister has developed into 10 mm. X 8 mm. Ulcer on the distal aspect right hallux.  No signs of infection or drainage right hallux.  NAILS  Thick disfigured discolored nails both feet.  Onychomycosis  B/L  Diabetes with neuropathy  Hallux Limitus  B/L  Diabetic ulcer right hallux.    Debridement of nails  B/L.Chris [name] Told the patient I was concerned that he may be having microvascular changes which has led to this open wound  development.  There is no redness swelling or drainage noted at the site of the skin necrosis. Open diabetic ulcer noted.   Neosporin/DSD.  Told to soak at home.   Chris [name]  He was also dispensed padding to cover the tips of both hallux  bilaterally.  RTC 3 months. for preventative foot care services.    If diabetic ulcer worsens with local wound care  he should make earlier appointment.   Helane Gunther DPM

## 2018-12-09 ENCOUNTER — Other Ambulatory Visit: Payer: Self-pay

## 2018-12-09 ENCOUNTER — Ambulatory Visit: Payer: Medicare Other | Admitting: Orthotics

## 2018-12-09 DIAGNOSIS — L97421 Non-pressure chronic ulcer of left heel and midfoot limited to breakdown of skin: Secondary | ICD-10-CM

## 2018-12-09 DIAGNOSIS — M2020 Hallux rigidus, unspecified foot: Secondary | ICD-10-CM

## 2018-12-09 DIAGNOSIS — L97501 Non-pressure chronic ulcer of other part of unspecified foot limited to breakdown of skin: Secondary | ICD-10-CM

## 2018-12-09 DIAGNOSIS — E08621 Diabetes mellitus due to underlying condition with foot ulcer: Secondary | ICD-10-CM

## 2018-12-09 DIAGNOSIS — E1142 Type 2 diabetes mellitus with diabetic polyneuropathy: Secondary | ICD-10-CM

## 2018-12-09 DIAGNOSIS — M79676 Pain in unspecified toe(s): Principal | ICD-10-CM

## 2018-12-09 DIAGNOSIS — B351 Tinea unguium: Secondary | ICD-10-CM

## 2018-12-09 DIAGNOSIS — M205X9 Other deformities of toe(s) (acquired), unspecified foot: Secondary | ICD-10-CM

## 2018-12-09 NOTE — Progress Notes (Signed)
Reorder 13 WOMENS shoe

## 2018-12-23 ENCOUNTER — Other Ambulatory Visit: Payer: Medicare Other | Admitting: Orthotics

## 2019-01-25 ENCOUNTER — Ambulatory Visit: Payer: Medicare Other | Admitting: Podiatry

## 2019-03-15 ENCOUNTER — Ambulatory Visit (INDEPENDENT_AMBULATORY_CARE_PROVIDER_SITE_OTHER): Payer: Medicare Other | Admitting: Podiatry

## 2019-03-15 ENCOUNTER — Other Ambulatory Visit: Payer: Self-pay

## 2019-03-15 ENCOUNTER — Encounter: Payer: Self-pay | Admitting: Podiatry

## 2019-03-15 DIAGNOSIS — E11628 Type 2 diabetes mellitus with other skin complications: Secondary | ICD-10-CM

## 2019-03-15 DIAGNOSIS — E11621 Type 2 diabetes mellitus with foot ulcer: Secondary | ICD-10-CM

## 2019-03-15 DIAGNOSIS — L84 Corns and callosities: Secondary | ICD-10-CM | POA: Diagnosis not present

## 2019-03-15 DIAGNOSIS — M79675 Pain in left toe(s): Secondary | ICD-10-CM

## 2019-03-15 DIAGNOSIS — M79674 Pain in right toe(s): Secondary | ICD-10-CM | POA: Diagnosis not present

## 2019-03-15 DIAGNOSIS — B351 Tinea unguium: Secondary | ICD-10-CM | POA: Insufficient documentation

## 2019-03-15 DIAGNOSIS — E1142 Type 2 diabetes mellitus with diabetic polyneuropathy: Secondary | ICD-10-CM

## 2019-03-15 DIAGNOSIS — E1140 Type 2 diabetes mellitus with diabetic neuropathy, unspecified: Secondary | ICD-10-CM

## 2019-03-15 NOTE — Progress Notes (Signed)
This patient presents the office with chief complaint of painful long nails. Patient has had an ulcer on his left foot which is 100 % better.  His nails are painful walking and wearing his shoes. He presents for preventative foot care services.  He also says he has a healing ulcer   on the tip of his right big toe. He has a   Healed ulcer /pre-ulcerous callus on tip left of toe.  He says he has no history of trauma or injury to his big toe.   ..  He denies any drainage. He presents to the office for preventive foot care services.  Patient is also here to receive his diabetic shoes.   GENERAL APPEARANCE: Alert, conversant. Appropriately groomed. No acute distress.  VASCULAR: Pedal pulses are  palpable at  [facility] and PT right foot but absent left foot.  Capillary refill time is immediate to all digits,  Normal temperature gradient.   NEUROLOGIC: sensation is absent  to 5.07 monofilament at 5/5 sites bilateral.  Light touch is absent  bilateral, Muscle strength normal.  MUSCULOSKELETAL: acceptable muscle strength, tone and stability bilateral.  Intrinsic muscluature intact bilateral.  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  DERMATOLOGIC: skin color, texture, and turgor are within normal limits. Healed ulcer sub 5th metatabase left foot.  [name] Ulcer on the distal aspect right hallux.  Healing with no signs of infection.   Healed ulcer tip of left hallux.  NAILS  Thick disfigured discolored nails both feet.  Onychomycosis  B/L  Diabetes with neuropathy  Hallux Limitus  B/L  Diabetic ulcer right hallux.    Debridement of nails  B/L.[name]  There is no redness swelling or drainage noted at the site of the skin ulcer  Right hallux...    .  He was also dispensed diabetic shoes.  Patient presents today and was dispensed 0ne pair ( two units) of medically necessary extra depth shoes with three pair( six units) of custom molded multiple density inserts. The shoes and the inserts are fitted to the  patients ' feet and are noted to fit well and are free of defect.  Length and width of the shoes are also acceptable.  Patient was given written and verbal  instructions for wearing.  If any concerns arrive with the shoes or inserts, the patient is to call the office.Patient is to follow up with doctor in six weeks.  RTC 10 weeks . for preventative foot care services.  Since there was no drainage patient was told there was no need to bandage or soak his feet.   If diabetic ulcer worsens with local wound care  he should make earlier appointment.   Gardiner Barefoot DPM

## 2019-03-24 ENCOUNTER — Other Ambulatory Visit: Payer: Medicare Other | Admitting: Orthotics

## 2019-04-26 ENCOUNTER — Encounter: Payer: Self-pay | Admitting: Podiatry

## 2019-04-26 ENCOUNTER — Ambulatory Visit (INDEPENDENT_AMBULATORY_CARE_PROVIDER_SITE_OTHER): Payer: Medicare Other | Admitting: Podiatry

## 2019-04-26 ENCOUNTER — Other Ambulatory Visit: Payer: Self-pay

## 2019-04-26 VITALS — Temp 97.6°F

## 2019-04-26 DIAGNOSIS — E1140 Type 2 diabetes mellitus with diabetic neuropathy, unspecified: Secondary | ICD-10-CM

## 2019-04-26 DIAGNOSIS — L97311 Non-pressure chronic ulcer of right ankle limited to breakdown of skin: Secondary | ICD-10-CM | POA: Diagnosis not present

## 2019-04-26 DIAGNOSIS — L97309 Non-pressure chronic ulcer of unspecified ankle with unspecified severity: Secondary | ICD-10-CM | POA: Insufficient documentation

## 2019-04-26 MED ORDER — CEPHALEXIN 500 MG PO CAPS: 500.0000 mg | ORAL_CAPSULE | Freq: Three times a day (TID) | ORAL | 0 refills | Status: DC

## 2019-04-26 NOTE — Patient Instructions (Signed)

## 2019-04-26 NOTE — Progress Notes (Signed)
This patient presents the office with chief complaint of a painful area behind his right heel on his right foot.  He says he has worn his new diabetic shoes and states that the shoes rubbed a blister on the back of his right foot.  He says he aggravated his foot about 2 weeks ago and the skin lesion has been draining and bleeding.  He presents to the office wearing a a band-aid.  He states that he has been experiencing pain into his foot and running up to the back of his leg.  The ulcer on the tip of his right big toe is healing nicely with no drainage noted.  General Appearance  Alert, conversant and in no acute stress.  Vascular  Dorsalis pedis and posterior tibial  pulses are palpable  Right foot but absent left foot..  Capillary return is within normal limits  bilaterally. Temperature is within normal limits  bilaterally.  Neurologic  Senn-Weinstein monofilament wire test absent  bilaterally. Muscle power within normal limits bilaterally.  Nails Thick disfigured discolored nails with subungual debris  from hallux to fifth toes bilaterally. No evidence of bacterial infection or drainage bilaterally.  Orthopedic  No limitations of motion  feet .  No crepitus or effusions noted.  No bony pathology or digital deformities noted.  Skin  normotropic skin with no porokeratosis noted bilaterally.  No signs of infections or ulcers noted.  Ulcer sub 5th metabase left and right hallux has healed.  Examination of his posterior right heel area reveals a 20 mm x 10 mm ulceration in the absence of any redness swelling or infection.    Diabetic ulcer right foot.  Diabetic neuropathy.   ROV.  Neosporin/DSD diabetic ulcer right foot.  Patient was told to soak foot at home.  Prescribe cephalexin 500 mg  # 30 one tid x 10 days.  Dispensed heel cushion to help relieve pain retrocalcaneally.  RTC 2 weeks.  Gardiner Barefoot DPM

## 2019-05-10 ENCOUNTER — Other Ambulatory Visit: Payer: Self-pay

## 2019-05-10 ENCOUNTER — Ambulatory Visit (INDEPENDENT_AMBULATORY_CARE_PROVIDER_SITE_OTHER): Payer: Medicare Other | Admitting: Podiatry

## 2019-05-10 ENCOUNTER — Encounter: Payer: Self-pay | Admitting: Podiatry

## 2019-05-10 VITALS — Temp 97.7°F

## 2019-05-10 DIAGNOSIS — E1140 Type 2 diabetes mellitus with diabetic neuropathy, unspecified: Secondary | ICD-10-CM | POA: Diagnosis not present

## 2019-05-10 DIAGNOSIS — L97311 Non-pressure chronic ulcer of right ankle limited to breakdown of skin: Secondary | ICD-10-CM | POA: Diagnosis not present

## 2019-05-10 NOTE — Progress Notes (Signed)
This patient returns to the office for continued treatment and evaluation of a newly developed blister/ulcer behind his right heel.  Patient was seen in the office last week and the ulcer measured 20 mm x 10 mm.  I sent him home with home soaks and a prescription for cephalexin 500 mg.  He says that this area continually breaks down and exposes the tissue under the skin when he soaks his feet.  This patient has a history of multiple ulcer formations on various sites  of both feet.  This patient is neuropathic and has no feeling in the back of his foot.  He presents the office today for continued evaluation and treatment.  General Appearance  Alert, conversant and in no acute stress.  Vascular  Dorsalis pedis and posterior tibial  pulses are palpable  bilaterally.  Capillary return is within normal limits  bilaterally. Temperature is within normal limits  bilaterally.  Neurologic  Senn-Weinstein monofilament wire test within absent   bilaterally. Muscle power within normal limits bilaterally.  Nails Thick disfigured discolored nails with subungual debris  from hallux to fifth toes bilaterally. No evidence of bacterial infection or drainage bilaterally.  Orthopedic  No limitations of motion  feet .  No crepitus or effusions noted.  No bony pathology or digital deformities noted.  Skin  normotropic skin with no porokeratosis noted bilaterally.  Ulcer/blister present retrocalcaneally right foot.  Ulcer measures 40 mm. x 30 mm.  Healthy granulation tissue noted at site of blister.  No redness or infection noted.  Blister/Ulcer right foot.  Diabetic neuropathy.  ROV.  After examination of the ulcer/blister I decided to bandage his right foot with an Unna boot in an effort to close this wound.  Neosporin/ dry sterile dressing and an Unna boot were  applied to his right foot.  He was told to go home and rest his foot and allow this to heal.  Patient should return to the office in 1 week at which time removal  of the Unna boot will be performed and the wound will be evaluated.     Gardiner Barefoot DPM

## 2019-05-17 ENCOUNTER — Ambulatory Visit (INDEPENDENT_AMBULATORY_CARE_PROVIDER_SITE_OTHER): Payer: Medicare Other | Admitting: Podiatry

## 2019-05-17 ENCOUNTER — Encounter: Payer: Self-pay | Admitting: Podiatry

## 2019-05-17 ENCOUNTER — Other Ambulatory Visit: Payer: Self-pay

## 2019-05-17 VITALS — Temp 98.6°F

## 2019-05-17 DIAGNOSIS — E1140 Type 2 diabetes mellitus with diabetic neuropathy, unspecified: Secondary | ICD-10-CM

## 2019-05-17 DIAGNOSIS — L97311 Non-pressure chronic ulcer of right ankle limited to breakdown of skin: Secondary | ICD-10-CM | POA: Diagnosis not present

## 2019-05-17 NOTE — Progress Notes (Signed)
This patient presents the office for continued evaluation and treatment of an ulcer that has developed behind his right heel.  He was seen initially about 4 weeks ago and treated with local wound care and antibiotics.  The ulcer had been doubled in size and I applied an Unna boot on his right foot.  He presents the office today wearing the Unna boot stating that he feels that the ulcer is better despite the fact it is still draining.  He also says that the heel cushion that he wears at night is very beneficial.  He presents the office today to have the Unna boot removed and have the wound reexamined.  General Appearance  Alert, conversant and in no acute stress.  Vascular  Dorsalis pedis and posterior tibial  pulses are palpable  bilaterally.  Capillary return is within normal limits  bilaterally. Temperature is within normal limits  bilaterally.  Neurologic  Senn-Weinstein monofilament wire test absent   bilaterally. Muscle power within normal limits bilaterally.  Nails Thick disfigured discolored nails with subungual debris  from hallux to fifth toes bilaterally. No evidence of bacterial infection or drainage bilaterally.  Orthopedic  No limitations of motion  feet .  No crepitus or effusions noted.  No bony pathology or digital deformities noted.  Skin  normotropic skin with no porokeratosis noted bilaterally.  No signs of infections or ulcers noted.  Retrocalcaneal ulcer measures approximately 15 x 15 mm today.  His ulcer has filled in with newly formed skin and now measures 15 mm x 15 mm.  No evidence of any infection redness noted.    Ulcer right foot.  ROV.  Removal of the end of the boot was performed.  Examination of the ulcer on his right foot reveals healing noted in the last week while he wore the [employer].  He also was bandaged with Silvadene dry sterile dressing.  I am hesitant on applying a second Unna boot due to the fragility of his skin and frequent ulcer development.  Discussed  future appointments with this patient.  He says that he will not be able to return to the office for approximately 2 weeks due to a trip he is taking with his son.  Patient is to monitor his ulcer and he was to reapply an Unna boot that was dispensed as needed.  Return to the clinic in 2 weeks for further evaluation and treatment  Gardiner Barefoot DPM

## 2019-06-03 ENCOUNTER — Encounter: Payer: Self-pay | Admitting: Podiatry

## 2019-06-03 ENCOUNTER — Ambulatory Visit (INDEPENDENT_AMBULATORY_CARE_PROVIDER_SITE_OTHER): Payer: Medicare Other | Admitting: Podiatry

## 2019-06-03 ENCOUNTER — Other Ambulatory Visit: Payer: Self-pay

## 2019-06-03 DIAGNOSIS — L97311 Non-pressure chronic ulcer of right ankle limited to breakdown of skin: Secondary | ICD-10-CM

## 2019-06-03 DIAGNOSIS — E1140 Type 2 diabetes mellitus with diabetic neuropathy, unspecified: Secondary | ICD-10-CM

## 2019-06-03 NOTE — Progress Notes (Addendum)
This patient presents the office for continued evaluation and treatment of an ulcer that has developed retrocalcaneally  right foot.  This ulcer has been present for approximately 2 months.  He has been treated with local wound care antibiotics and an Haematologist.  He was last seen on 824 and the Unna boot was removed.  The ulcer was healing and was recorded to be 15 mm x 15 mm.  He was instructed to provide local wound care and if there is no improvement he was to apply an Haematologist which was dispensed to him since he was scheduled to travel.  He presents the office today stating that the ulcer is still sore draining and even bleeding into his sock.  He says his wife has been applying Silvadene and bandaging the ulcer for the last 2 weeks.  He presents the office today wearing a Band-Aid and wearing his regular shoes.  He presents the office today for evaluation and treatment.   General Appearance  Alert, conversant and in no acute stress.  Vascular  Dorsalis pedis and posterior tibial  pulses are palpable  bilaterally.  Capillary return is within normal limits  bilaterally. Temperature is within normal limits  bilaterally.  Neurologic  Senn-Weinstein monofilament wire test absent   bilaterally. Muscle power within normal limits bilaterally.  Nails Thick disfigured discolored nails with subungual debris  from hallux to fifth toes bilaterally. No evidence of bacterial infection or drainage bilaterally.  Orthopedic  No limitations of motion  feet .  No crepitus or effusions noted.  No bony pathology or digital deformities noted.  Skin retrocalcaneal ulcer measures approximately 15 x 10 mm today.  No evidence of any redness swelling or infection.  There is white necrotic tissue noted around the outer rim of the ulcer.  Diabetic Ulcer right foot  ROV.  Examination of the ulcer was performed and there was minimal improvement from his visit 2 weeks ago.  Discussed this ulcer  with this patient.  Told him  that I will need to send him to the wound care center in [place].  He asked if there was any treatment prior to the wound care center appointment.  Talked with Angie and she suggested we use Aquacel at the site of the ulcer.  Therefore Aquacel was applied to the ulcer and covered with a dry sterile dressing.  He is to perform additional wound changes over the next 10 days and return to the office in 10 days so we can follow his ulcer.  If there is no improvement with the Aquacel I will consider wound care center referral .  Patient was sent home with the remaining Aquacel for home bandaging as well as an additional Aquacel package.   I was unable to find any results of a hemoglobin A1c test in his records.  Gardiner Barefoot DPM   .

## 2019-06-04 ENCOUNTER — Encounter: Payer: Self-pay | Admitting: Podiatry

## 2019-06-14 ENCOUNTER — Other Ambulatory Visit: Payer: Self-pay

## 2019-06-14 ENCOUNTER — Ambulatory Visit (INDEPENDENT_AMBULATORY_CARE_PROVIDER_SITE_OTHER): Payer: Medicare Other | Admitting: Podiatry

## 2019-06-14 ENCOUNTER — Encounter: Payer: Self-pay | Admitting: Podiatry

## 2019-06-14 DIAGNOSIS — L97311 Non-pressure chronic ulcer of right ankle limited to breakdown of skin: Secondary | ICD-10-CM

## 2019-06-14 DIAGNOSIS — E1140 Type 2 diabetes mellitus with diabetic neuropathy, unspecified: Secondary | ICD-10-CM | POA: Diagnosis not present

## 2019-06-14 NOTE — Progress Notes (Signed)
This patient returns to the office for continued evaluation and treatment of a diabetic ulcer on the back of his right heel.  Patient states that the heel is not hurting as much but he still notes significant drainage.  Patient has been applying Aquacel to the site of the ulcer for the last week in an effort to close the ulcer.  He says that every time he changes the Aquacel there is significant drainage from the site of the ulcer.  He presents the office today for continued evaluation and treatment.  General Appearance  Alert, conversant and in no acute stress.  Vascular  Dorsalis pedis and posterior tibial  pulses are palpable  bilaterally.  Capillary return is within normal limits  bilaterally. Temperature is within normal limits  bilaterally.  Neurologic  Senn-Weinstein monofilament wire test within normal limits  bilaterally. Muscle power within normal limits bilaterally.  Nails Thick disfigured discolored nails with subungual debris  from hallux to fifth toes bilaterally. No evidence of bacterial infection or drainage bilaterally.  Orthopedic  No limitations of motion  feet .  No crepitus or effusions noted.  No bony pathology or digital deformities noted.  Skin  normotropic skin with no porokeratosis noted bilaterally.  Retrocalcaneal ulcer is still present with clean granulation tissue at the site of the ulcer.    Diabetic ulcer right foot  ROV.  The Aquacel bandage was removed and there was significant drainage on the bandage.  Examination of the ulcer reveals clean healing granulation tissue noted at the posterior aspect of the right heel.  Debride necrotic tissue Neosporin dry sterile dressing was applied.  Told this patient I would recommend that he present to the wound care center and have them address and heal this ulcer.  Return to the clinic for preventative foot care services in 3 months  Gardiner Barefoot [facility]

## 2019-06-17 ENCOUNTER — Telehealth: Payer: Self-pay

## 2019-06-17 DIAGNOSIS — L97421 Non-pressure chronic ulcer of left heel and midfoot limited to breakdown of skin: Secondary | ICD-10-CM

## 2019-06-17 DIAGNOSIS — E08621 Diabetes mellitus due to underlying condition with foot ulcer: Secondary | ICD-10-CM

## 2019-06-17 NOTE — Telephone Encounter (Signed)
Referral entered in chart.

## 2019-06-17 NOTE — Telephone Encounter (Signed)
-----   Message from Gardiner Barefoot, DPM sent at 06/14/2019  1:42 PM EDT ----- Please make this patient to the wound care center in [place].  Thanks  gam

## 2019-06-28 ENCOUNTER — Encounter: Payer: Medicare Other | Admitting: Physician Assistant

## 2019-07-02 ENCOUNTER — Other Ambulatory Visit: Payer: Self-pay

## 2019-07-02 ENCOUNTER — Encounter: Payer: Medicare Other | Attending: Physician Assistant | Admitting: Physician Assistant

## 2019-07-02 DIAGNOSIS — E11621 Type 2 diabetes mellitus with foot ulcer: Secondary | ICD-10-CM | POA: Insufficient documentation

## 2019-07-02 DIAGNOSIS — I10 Essential (primary) hypertension: Secondary | ICD-10-CM | POA: Insufficient documentation

## 2019-07-02 DIAGNOSIS — L97412 Non-pressure chronic ulcer of right heel and midfoot with fat layer exposed: Secondary | ICD-10-CM | POA: Diagnosis not present

## 2019-07-02 DIAGNOSIS — E11622 Type 2 diabetes mellitus with other skin ulcer: Secondary | ICD-10-CM | POA: Insufficient documentation

## 2019-07-02 DIAGNOSIS — I2510 Atherosclerotic heart disease of native coronary artery without angina pectoris: Secondary | ICD-10-CM | POA: Insufficient documentation

## 2019-07-02 DIAGNOSIS — E1140 Type 2 diabetes mellitus with diabetic neuropathy, unspecified: Secondary | ICD-10-CM | POA: Diagnosis present

## 2019-07-12 NOTE — Progress Notes (Signed)
Chris [name], Chris [name] ([id number]) Visit Report for 07/02/2019 Allergy List Details Patient Name: Chris [name], Chris [name] Date of Service: 07/02/2019 8:00 AM Medical Record Number: [id number] Patient Account Number: [id number] Date of Birth/Sex: 1940-10-03 (78 y.o. M) Treating RN: Rodell PernaScott, Dajea Primary Care [name]: Lenda KelpPATEL, BANSARI Other Clinician: Referring [name]: Helane GuntherMAYER, GREGORY Treating Nelson Noone/Extender: STONE III, HOYT Weeks in Treatment: 0 Allergies Active Allergies No Known Allergies Allergy Notes Electronic Signature(s) Signed: 07/12/2019 8:11:06 AM By: Rodell PernaScott, Dajea Entered By: Rodell PernaScott, Dajea on 07/02/2019 08:20:21 Chris [name], Chris [name] ([id number]) -------------------------------------------------------------------------------- Arrival Information Details Patient Name: Chris [name], Chris [name] Date of Service: 07/02/2019 8:00 AM Medical Record Number: [id number] Patient Account Number: [id number] Date of Birth/Sex: 1940-10-03 (78 y.o. M) Treating RN: Rodell PernaScott, Dajea Primary Care Melat Wrisley: Lenda KelpPATEL, BANSARI Other Clinician: Referring [name]: Helane GuntherMAYER, GREGORY Treating Shanyce Daris/Extender: Linwood DibblesSTONE III, HOYT Weeks in Treatment: 0 Visit Information Patient Arrived: Cane Arrival Time: 08:18 Accompanied By: self Transfer Assistance: None Patient Identification Verified: Yes Patient Has Alerts: Yes Patient Alerts: Patient on Blood Thinner Notes aspirin Electronic Signature(s) Signed: 07/12/2019 8:11:06 AM By: Rodell PernaScott, Dajea Entered By: Rodell PernaScott, Dajea on 07/02/2019 08:19:10 Chris [name], Chris [name] ([id number]) -------------------------------------------------------------------------------- Clinic Level of Care Assessment Details Patient Name: Chris [name], Chris [name] Date of Service: 07/02/2019 8:00 AM Medical Record Number: [id number] Patient Account Number: [id number] Date of Birth/Sex: 1940-10-03 (78 y.o. M) Treating RN: Curtis Sitesorthy, Joanna Primary Care Zyara Riling: Lenda KelpPATEL, BANSARI Other Clinician: Referring Polette Nofsinger: Helane GuntherMAYER, GREGORY Treating  Dakari Cregger/Extender: Linwood DibblesSTONE III, HOYT Weeks in Treatment: 0 Clinic Level of Care Assessment Items TOOL 1 Quantity Score []  - Use when EandM and Procedure is performed on INITIAL visit 0 ASSESSMENTS - Nursing Assessment / Reassessment X - General Physical Exam (combine w/ comprehensive assessment (listed just below) when 1 20 performed on new pt. evals) X- 1 25 Comprehensive Assessment (HX, ROS, Risk Assessments, Wounds Hx, etc.) ASSESSMENTS - Wound and Skin Assessment / Reassessment []  - Dermatologic / Skin Assessment (not related to wound area) 0 ASSESSMENTS - Ostomy and/or Continence Assessment and Care []  - Incontinence Assessment and Management 0 []  - 0 Ostomy Care Assessment and Management (repouching, etc.) PROCESS - Coordination of Care X - Simple Patient / Family Education for ongoing care 1 15 []  - 0 Complex (extensive) Patient / Family Education for ongoing care X- 1 10 Staff obtains ChiropractorConsents, Records, Test Results / Process Orders []  - 0 Staff telephones HHA, Nursing Homes / Clarify orders / etc []  - 0 Routine Transfer to another Facility (non-emergent condition) []  - 0 Routine Hospital Admission (non-emergent condition) X- 1 15 New Admissions / Manufacturing engineernsurance Authorizations / Ordering NPWT, Apligraf, etc. []  - 0 Emergency Hospital Admission (emergent condition) PROCESS - Special Needs []  - Pediatric / Minor Patient Management 0 []  - 0 Isolation Patient Management []  - 0 Hearing / Language / Visual special needs []  - 0 Assessment of Community assistance (transportation, D/C planning, etc.) []  - 0 Additional assistance / Altered mentation []  - 0 Support Surface(s) Assessment (bed, cushion, seat, etc.) Chris [name], Chris [name] ([id number]) INTERVENTIONS - Miscellaneous []  - External ear exam 0 []  - 0 Patient Transfer (multiple staff / Nurse, adultHoyer Lift / Similar devices) []  - 0 Simple Staple / Suture removal (25 or less) []  - 0 Complex Staple / Suture removal (26 or more) []  -  0 Hypo/Hyperglycemic Management (do not check if billed separately) X- 1 15 Ankle / Brachial Index (ABI) - do not check if billed separately Has the patient been seen at the hospital within the last three years: Yes Total Score: 100 Level Of Care: New/Established - Level 3 Electronic  Signature(s) Signed: 07/02/2019 4:36:34 PM By: Montey Hora Entered By: Montey Hora on 07/02/2019 09:16:03 Chris [name] ([id number]) -------------------------------------------------------------------------------- Encounter Discharge Information Details Patient Name: Chris [name] Date of Service: 07/02/2019 8:00 AM Medical Record Number: [id number] Patient Account Number: [id number] Date of Birth/Sex: 1941-01-02 (78 y.o. M) Treating RN: Montey Hora Primary Care Qamar Rosman: Peri Jefferson Other Clinician: Referring Francheska Villeda: Gardiner Barefoot Treating Tawan Degroote/Extender: Melburn Hake, HOYT Weeks in Treatment: 0 Encounter Discharge Information Items Post Procedure Vitals Discharge Condition: Stable Temperature (F): 98.7 Ambulatory Status: Cane Pulse (bpm): 63 Discharge Destination: Home Respiratory Rate (breaths/min): 16 Transportation: Private Auto Blood Pressure (mmHg): 159/99 Accompanied By: self Schedule Follow-up Appointment: Yes Clinical Summary of Care: Electronic Signature(s) Signed: 07/02/2019 4:36:34 PM By: Montey Hora Entered By: Montey Hora on 07/02/2019 09:17:12 Chris [name] ([id number]) -------------------------------------------------------------------------------- Lower Extremity Assessment Details Patient Name: Chris [name] Date of Service: 07/02/2019 8:00 AM Medical Record Number: [id number] Patient Account Number: [id number] Date of Birth/Sex: 30-Sep-1940 (78 y.o. M) Treating RN: Army Melia Primary Care Zenaida Tesar: Peri Jefferson Other Clinician: Referring Martavious Hartel: Gardiner Barefoot Treating Jestine Bicknell/Extender: Melburn Hake, HOYT Weeks in Treatment: 0 Edema  Assessment Assessed: [Left: No] [Right: No] Edema: [Left: No] [Right: No] Calf Left: Right: Point of Measurement: 39 cm From Medial Instep 38 cm 38 cm Ankle Left: Right: Point of Measurement: 12 cm From Medial Instep 22 cm 21 cm Vascular Assessment Pulses: Dorsalis Pedis Palpable: [Left:Yes] [Right:Yes] Doppler Audible: [Right:Yes] Posterior Tibial Doppler Audible: [Left:Yes] [Right:Yes] Popliteal Palpable: [Left:Yes] [Right:Yes] Blood Pressure: Brachial: [Left:132] Dorsalis Pedis: 120 Ankle: Posterior Tibial: 80 Ankle Brachial Index: [Left:0.91] Notes Right leg non-compressible Electronic Signature(s) Signed: 07/12/2019 8:11:06 AM By: Army Melia Entered By: Army Melia on 07/02/2019 08:39:33 Chris [name] ([id number]) -------------------------------------------------------------------------------- Multi Wound Chart Details Patient Name: Chris [name] Date of Service: 07/02/2019 8:00 AM Medical Record Number: [id number] Patient Account Number: [id number] Date of Birth/Sex: 17-May-1941 (78 y.o. M) Treating RN: Montey Hora Primary Care Jadakiss Barish: Peri Jefferson Other Clinician: Referring Kashmere Staffa: Gardiner Barefoot Treating Wes Lezotte/Extender: Melburn Hake, HOYT Weeks in Treatment: 0 Vital Signs Height(in): 73 Pulse(bpm): 63 Weight(lbs): 250 Blood Pressure(mmHg): 159/99 Body Mass Index(BMI): 33 Temperature(F): 98.7 Respiratory Rate 16 (breaths/min): Photos: [N/A:N/A] Wound Location: Right Calcaneus N/A N/A Wounding Event: Gradually Appeared N/A N/A Primary Etiology: Diabetic Wound/Ulcer of the N/A N/A Lower Extremity Comorbid History: Cataracts, Coronary Artery N/A N/A Disease, Hypertension, Type II Diabetes, Neuropathy Date Acquired: 04/05/2019 N/A N/A Weeks of Treatment: 0 N/A N/A Wound Status: Open N/A N/A Measurements L x W x D 1x0.6x0.1 N/A N/A (cm) Area (cm) : 0.471 N/A N/A Volume (cm) : 0.047 N/A N/A Classification: Grade 1 N/A N/A Exudate Amount:  Medium N/A N/A Exudate Type: Serosanguineous N/A N/A Exudate Color: red, brown N/A N/A Wound Margin: Flat and Intact N/A N/A Granulation Amount: Medium (34-66%) N/A N/A Granulation Quality: Pink N/A N/A Necrotic Amount: Medium (34-66%) N/A N/A Necrotic Tissue: Eschar, Adherent Slough N/A N/A Exposed Structures: Fat Layer (Subcutaneous N/A N/A Tissue) Exposed: Yes Fascia: No Tendon: No Muscle: No Chris [name], Chris [name] ([id number]) Joint: No Bone: No Epithelialization: None N/A N/A Treatment Notes Electronic Signature(s) Signed: 07/02/2019 4:36:34 PM By: Montey Hora Entered By: Montey Hora on 07/02/2019 Chris [name], Chris [name] ([id number]) -------------------------------------------------------------------------------- Multi-Disciplinary Care Plan Details Patient Name: Chris [name] Date of Service: 07/02/2019 8:00 AM Medical Record Number: [id number] Patient Account Number: [id number] Date of Birth/Sex: [birth year] (78 y.o. M) Treating RN: Montey Hora Primary Care Nekeisha Aure: Peri Jefferson Other Clinician: Referring Jean Alejos: Gardiner Barefoot Treating Haily Caley/Extender: STONE III, HOYT Weeks in Treatment: 0 Active Inactive Necrotic Tissue  Nursing Diagnoses: Knowledge deficit related to management of necrotic/devitalized tissue Goals: Necrotic/devitalized tissue will be minimized in the wound bed Date Initiated: 07/02/2019 Target Resolution Date: 10/02/2019 Goal Status: Active Interventions: Provide education on necrotic tissue and debridement process Notes: Orientation to the Wound Care Program Nursing Diagnoses: Knowledge deficit related to the wound healing center program Goals: Patient/caregiver will verbalize understanding of the Wound Healing Center Program Date Initiated: 07/02/2019 Target Resolution Date: 10/02/2019 Goal Status: Active Interventions: Provide education on orientation to the wound center Notes: Peripheral Neuropathy Nursing Diagnoses: Potential  alteration in peripheral tissue perfusion (select prior to confirmation of diagnosis) Goals: Patient/caregiver will verbalize understanding of disease process and disease management Date Initiated: 07/02/2019 Target Resolution Date: 10/02/2019 Goal Status: Active Interventions: Assess signs and symptoms of neuropathy upon admission and as needed Chris [name], Chris [name] ([id number]) Notes: Wound/Skin Impairment Nursing Diagnoses: Impaired tissue integrity Goals: Ulcer/skin breakdown will heal within 14 weeks Date Initiated: 07/02/2019 Target Resolution Date: 10/02/2019 Goal Status: Active Interventions: Assess patient/caregiver ability to obtain necessary supplies Assess patient/caregiver ability to perform ulcer/skin care regimen upon admission and as needed Assess ulceration(s) every visit Notes: Electronic Signature(s) Signed: 07/02/2019 4:36:34 PM By: Curtis Sites Entered By: Curtis Sites on 07/02/2019 09:06:06 Chris [name] ([id number]) -------------------------------------------------------------------------------- Pain Assessment Details Patient Name: Chris [name] Date of Service: 07/02/2019 8:00 AM Medical Record Number: [id number] Patient Account Number: [id number] Date of Birth/Sex: Sep 11, 1941 (78 y.o. M) Treating RN: Rodell Perna Primary Care Dorinda Stehr: Lenda Kelp Other Clinician: Referring Nyshawn Gowdy: Helane Gunther Treating Ralphael Southgate/Extender: Linwood Dibbles, HOYT Weeks in Treatment: 0 Active Problems Location of Pain Severity and Description of Pain Patient Has Paino Yes Site Locations Pain Location: Pain in Ulcers Rate the pain. Current Pain Level: 2 Pain Management and Medication Current Pain Management: Electronic Signature(s) Signed: 07/12/2019 8:11:06 AM By: Rodell Perna Entered By: Rodell Perna on 07/02/2019 08:19:24 Chris [name] ([id number]) -------------------------------------------------------------------------------- Patient/Caregiver Education Details Patient  Name: Chris [name] Date of Service: 07/02/2019 8:00 AM Medical Record Number: [id number] Patient Account Number: [id number] Date of Birth/Gender: 14-Jan-1941 (78 y.o. M) Treating RN: Curtis Sites Primary Care Physician: Lenda Kelp Other Clinician: Referring Physician: Helane Gunther Treating Physician/Extender: Skeet Simmer in Treatment: 0 Education Assessment Education Provided To: Patient Education Topics Provided Wound/Skin Impairment: Handouts: Other: wound care as ordered Methods: Demonstration, Explain/Verbal Responses: State content correctly Electronic Signature(s) Signed: 07/02/2019 4:36:34 PM By: Curtis Sites Entered By: Curtis Sites on 07/02/2019 09:16:20 Chris [name] ([id number]) -------------------------------------------------------------------------------- Wound Assessment Details Patient Name: Chris [name] Date of Service: 07/02/2019 8:00 AM Medical Record Number: [id number] Patient Account Number: [id number] Date of Birth/Sex: [birth year] (78 y.o. M) Treating RN: Rodell Perna Primary Care Jaire Pinkham: Lenda Kelp Other Clinician: Referring Maziah Smola: Helane Gunther Treating Gilma Bessette/Extender: STONE III, HOYT Weeks in Treatment: 0 Wound Status Wound Number: 1 Primary Diabetic Wound/Ulcer of the Lower Extremity Etiology: Wound Location: Right Calcaneus Wound Open Wounding Event: Gradually Appeared Status: Date Acquired: 04/05/2019 Comorbid Cataracts, Coronary Artery Disease, Weeks Of Treatment: 0 History: Hypertension, Type II Diabetes, Neuropathy Clustered Wound: No Photos Wound Measurements Length: (cm) 1 % Reduction i Width: (cm) 0.6 % Reduction i Depth: (cm) 0.1 Epithelializa Area: (cm) 0.471 Tunneling: Volume: (cm) 0.047 Undermining: n Area: n Volume: tion: None No No Wound Description Classification: Grade 1 Foul Odor Aft Wound Margin: Flat and Intact Slough/Fibrin Exudate Amount: Medium Exudate Type:  Serosanguineous Exudate Color: red, brown er Cleansing: No o Yes Wound Bed Granulation Amount: Medium (34-66%) Exposed Structure Granulation Quality: Pink Fascia Exposed: No Necrotic Amount: Medium (34-66%) Fat Layer (Subcutaneous  Tissue) Exposed: Yes Necrotic Quality: Eschar, Adherent Slough Tendon Exposed: No Muscle Exposed: No Joint Exposed: No Bone Exposed: No Treatment Notes REMMINGTON, URIETA ([id number]) Wound #1 (Right Calcaneus) Notes Hydrofera blue and telfa Chiropodist) Signed: 07/12/2019 8:11:06 AM By: Rodell Perna Entered By: Rodell Perna on 07/02/2019 08:32:31 Chris [name] ([id number]) -------------------------------------------------------------------------------- Vitals Details Patient Name: Chris [name] Date of Service: 07/02/2019 8:00 AM Medical Record Number: [id number] Patient Account Number: [id number] Date of Birth/Sex: 06/16/1941 (78 y.o. M) Treating RN: Rodell Perna Primary Care Tameya Kuznia: Lenda Kelp Other Clinician: Referring Hilda Rynders: Helane Gunther Treating Bernie Ransford/Extender: Linwood Dibbles, HOYT Weeks in Treatment: 0 Vital Signs Time Taken: 08:19 Temperature (F): 98.7 Height (in): 73 Pulse (bpm): 63 Source: Stated Respiratory Rate (breaths/min): 16 Weight (lbs): 250 Blood Pressure (mmHg): 159/99 Source: Stated Reference Range: 80 - 120 mg / dl Body Mass Index (BMI): 33 Electronic Signature(s) Signed: 07/12/2019 8:11:06 AM By: Rodell Perna Entered By: Rodell Perna on 07/02/2019 08:20:04

## 2019-07-12 NOTE — Progress Notes (Signed)
Chris [name], Kippy ([id number]) Visit Report for 07/02/2019 Chief Complaint Document Details Patient Name: Chris [name], Chris [name] Date of Service: 07/02/2019 8:00 AM Medical Record Number: [id number] Patient Account Number: [id number] Date of Birth/Sex: 1940/12/09 (77 y.o. M) Treating RN: Curtis Sitesorthy, Joanna Primary Care Provider: Lenda KelpPATEL, BANSARI Other Clinician: Referring Provider: Helane GuntherMAYER, GREGORY Treating Provider/Extender: Linwood DibblesSTONE III, Ayaana Biondo Weeks in Treatment: 0 Information Obtained from: Patient Chief Complaint Right heel ulcer Electronic Signature(s) Signed: 07/02/2019 8:45:16 AM By: Lenda KelpStone III, Nalini Alcaraz PA-C Entered By: Lenda KelpStone III, Zalmen Wrightsman on 07/02/2019 08:45:16 Chris [name], [name] ([id number]) -------------------------------------------------------------------------------- Debridement Details Patient Name: Chris [name], Chris [name] Date of Service: 07/02/2019 8:00 AM Medical Record Number: [id number] Patient Account Number: [id number] Date of Birth/Sex: 1940/12/09 (77 y.o. M) Treating RN: Curtis Sitesorthy, Joanna Primary Care Provider: Lenda KelpPATEL, BANSARI Other Clinician: Referring Provider: Helane GuntherMAYER, GREGORY Treating Provider/Extender: Linwood DibblesSTONE III, Lace Chenevert Weeks in Treatment: 0 Debridement Performed for Wound #1 Right Calcaneus Assessment: Performed By: Physician STONE III, Annalis Kaczmarczyk E., PA-C Debridement Type: Debridement Severity of Tissue Pre Fat layer exposed Debridement: Level of Consciousness (Pre- Awake and Alert procedure): Pre-procedure Verification/Time Yes - 09:09 Out Taken: Start Time: 09:09 Pain Control: Lidocaine 4% Topical Solution Total Area Debrided (L x W): 1 (cm) x 0.6 (cm) = 0.6 (cm) Tissue and other material Viable, Non-Viable, Slough, Subcutaneous, Slough debrided: Level: Skin/Subcutaneous Tissue Debridement Description: Excisional Instrument: Curette Bleeding: Minimum Hemostasis Achieved: Pressure End Time: 09:11 Procedural Pain: 0 Post Procedural Pain: 0 Response to Treatment: Procedure was tolerated well Level  of Consciousness Awake and Alert (Post-procedure): Post Debridement Measurements of Total Wound Length: (cm) 1 Width: (cm) 0.6 Depth: (cm) 0.2 Volume: (cm) 0.094 Character of Wound/Ulcer Post Debridement: Improved Severity of Tissue Post Debridement: Fat layer exposed Post Procedure Diagnosis Same as Pre-procedure Electronic Signature(s) Signed: 07/02/2019 4:36:34 PM By: Curtis Sitesorthy, Joanna Signed: 07/02/2019 5:06:41 PM By: Lenda KelpStone III, Leovanni Bjorkman PA-C Entered By: Curtis Sitesorthy, Joanna on 07/02/2019 09:11:41 Chris [name], Agapito ([id number]) -------------------------------------------------------------------------------- HPI Details Patient Name: Chris [name], Chris [name] Date of Service: 07/02/2019 8:00 AM Medical Record Number: [id number] Patient Account Number: [id number] Date of Birth/Sex: 1940/12/09 (77 y.o. M) Treating RN: Curtis Sitesorthy, Joanna Primary Care Provider: Lenda KelpPATEL, BANSARI Other Clinician: Referring Provider: Helane GuntherMAYER, GREGORY Treating Provider/Extender: Linwood DibblesSTONE III, Maxime Beckner Weeks in Treatment: 0 History of Present Illness HPI Description: 07/02/2019 upon evaluation today patient presents for initial inspection concerning a wound that he has on the posterior aspect of his right heel. This has been present he tells me since earlier summer 2020. He does have a history of diabetes mellitus type 2 as well as hypertension. He has been seeing podiatry and was subsequently referred from podiatry to us for further evaluation and treatment due to the nonhealing wound. He states he is also been having some unfortunate pain although I believe this may be more of a nerve pain than anything else. There is no signs of infection and overall I am actually somewhat pleased with the appearance of what I see although there is some slough noted on the surface of the wound I think there may be some things we can do to speed along his recovery. No fevers, chills, nausea, vomiting, or diarrhea. The patient's blood sugar is under fairly good  control with a hemoglobin A1c of 7.5 that was taken last on September 3. He was given Keflex as well although he is done with that. It did not seem to change much 1 way or another. Electronic Signature(s) Signed: 07/02/2019 4:57:08 PM By: Lenda KelpStone III, Libbi Towner PA-C Entered By: Lenda KelpStone III, Manav Pierotti on 07/02/2019 16:57:08 Chris [name], Rodney ([id number]) -------------------------------------------------------------------------------- Physical Exam  Details Patient Name: Chris [name] Date of Service: 07/02/2019 8:00 AM Medical Record Number: [id number] Patient Account Number: [id number] Date of Birth/Sex: 15-Jan-1941 (77 y.o. M) Treating RN: Curtis Sites Primary Care Provider: Lenda Kelp Other Clinician: Referring Provider: Helane Gunther Treating Provider/Extender: STONE III, Eleena Grater Weeks in Treatment: 0 Constitutional patient is hypertensive.. pulse regular and within target range for patient.[name] respirations regular, non-labored and within target range for patient.[name] temperature within target range for patient.. Well-nourished and well-hydrated in no acute distress. Eyes conjunctiva [name] no eyelid edema noted. pupils equal round and reactive to light and accommodation. Ears, Nose, Mouth, and Throat no gross abnormality of ear auricles or external auditory canals. normal hearing noted during conversation. mucus membranes moist. Respiratory normal breathing without difficulty. [name] to auscultation bilaterally. Cardiovascular regular rate and rhythm with normal S1, S2. no clubbing, cyanosis, significant edema, <3 sec cap refill. Gastrointestinal (GI) soft, non-tender, non-distended, +BS. no ventral hernia noted. Musculoskeletal normal gait and posture. no significant deformity or arthritic changes, no loss or range of motion, no clubbing. Psychiatric this patient is able to make decisions and demonstrates good insight into disease process. Alert and Oriented x 3. pleasant and cooperative. Notes Upon  inspection today patient's wound bed actually showed signs of some slough noted on the surface of the wound which did require some sharp debridement. I was able to perform sharp debridement to [name] away the necrotic tissue and the wound did not really seem to be significantly deeper post debridement which was good news. I am going to suggest at this point that we likely need to consider some potential offloading I think wearing shoes even better in shoes seems to bother him right now with the pain I think that simply due to the fact that it is rubbing on this region. I think a heel offloading shoe would be appropriate but I think he really needs one with the back rub. Unfortunately we do not have 1 of those in the office. Electronic Signature(s) Signed: 07/02/2019 4:57:56 PM By: Lenda Kelp PA-C Entered By: Lenda Kelp on 07/02/2019 16:57:56 Chris [name] ([id number]) -------------------------------------------------------------------------------- Physician Orders Details Patient Name: Chris [name] Date of Service: 07/02/2019 8:00 AM Medical Record Number: [id number] Patient Account Number: [id number] Date of Birth/Sex: 20-Aug-1941 (77 y.o. 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 Primary Wound Dressing Wound #1 Right Calcaneus o Hydrafera Blue Ready Transfer Secondary Dressing Wound #1 Right  Calcaneus o [place] Dressing Change Frequency Wound #1 Right Calcaneus o Change Dressing Monday, Wednesday, Friday Follow-up Appointments Wound #1 Right Calcaneus o Return Appointment in 2 weeks. Off-Loading Wound #1 Right Calcaneus o Other: - You can purchase a heel offloading shoe Electronic Signature(s) Signed: 07/02/2019 4:36:34 PM By: Curtis Sites Signed: 07/02/2019 5:06:41 PM By: Lenda Kelp PA-C Entered By: Curtis Sites on 07/02/2019 09:15:44 Chris [name] ([id number]) -------------------------------------------------------------------------------- Problem List Details Patient Name: Chris [name] Date of Service: 07/02/2019 8:00 AM Medical Record Number: [id number] Patient Account Number: [id number] Date of Birth/Sex: Dec 15, 1940 (77  y.o. M) Treating RN: Curtis Sites Primary Care Provider: Lenda Kelp Other Clinician: Referring Provider: Helane Gunther Treating Provider/Extender: Linwood Dibbles, Jerusalem Brownstein Weeks in Treatment: 0 Active Problems ICD-10 Evaluated Encounter Code Description Active Date Today Diagnosis E11.622 Type 2 diabetes mellitus with other skin ulcer 07/02/2019 No Yes 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 07/02/2019 No Yes layer exposed I10 Essential (primary) hypertension 07/02/2019 No Yes Inactive Problems Resolved Problems Electronic Signature(s) Signed: 07/02/2019 8:44:53 AM By: Lenda Kelp PA-C Entered By: Lenda Kelp on 07/02/2019 08:44:53 Chris [name] ([id number]) -------------------------------------------------------------------------------- Progress Note Details Patient Name: Chris [name] Date of Service: 07/02/2019 8:00 AM Medical Record Number: [id number] Patient Account Number: [id number] Date of Birth/Sex: 09-28-1940 (77 y.o. M) Treating RN: Curtis Sites Primary Care Provider: Lenda Kelp Other Clinician: Referring Provider: Helane Gunther Treating Provider/Extender: Linwood Dibbles,  Abrie Egloff Weeks in Treatment: 0 Subjective Chief Complaint Information obtained from Patient Right heel ulcer History of Present Illness (HPI) 07/02/2019 upon evaluation today patient presents for initial inspection concerning a wound that he has on the posterior aspect of his right heel. This has been present he tells me since earlier summer 2020. He does have a history of diabetes mellitus type 2 as well as hypertension. He has been seeing podiatry and was subsequently referred from podiatry to [place] for further evaluation and treatment due to the nonhealing wound. He states he is also been having some unfortunate pain although I believe this may be more of a nerve pain than anything else. There is no signs of infection and overall I am actually somewhat pleased with the appearance of what I see although there is some slough noted on the surface of the wound I think there may be some things we can do to speed along his recovery. No fevers, chills, nausea, vomiting, or diarrhea. The patient's blood sugar is under fairly good control with a hemoglobin A1c of 7.5 that was taken last on September 3. He was given Keflex as well although he is done with that. It did not seem to change much 1 way or another. Patient History Information obtained from Patient. Allergies No Known Allergies Family History Diabetes - Mother,Maternal Grandparents, No family history of Cancer, Heart Disease, Hereditary Spherocytosis, Hypertension, Kidney Disease, Lung Disease, Seizures, Stroke, Thyroid Problems, Tuberculosis. Social History Never smoker, Marital Status - Married, Alcohol Use - Never, Drug Use - No History, Caffeine Use - Never. Medical History Eyes Patient has history of Cataracts Denies history of Glaucoma, Optic Neuritis Ear/Nose/Mouth/Throat Denies history of Chronic sinus problems/congestion, Middle ear problems Hematologic/Lymphatic Denies history of Anemia, Hemophilia, Human Immunodeficiency Virus,  Lymphedema, Sickle Cell Disease Respiratory Denies history of Aspiration, Asthma, Chronic Obstructive Pulmonary Disease (COPD), Pneumothorax, Sleep Apnea, Tuberculosis Cardiovascular Patient has history of Coronary Artery Disease, Hypertension Denies history of Angina, Arrhythmia, Congestive Heart Failure, Deep Vein Thrombosis, Hypotension, Myocardial Infarction, Peripheral Arterial Disease, Peripheral Venous Disease, Phlebitis, Vasculitis Pascuzzi, Tye ([id number]) Gastrointestinal Denies history of Cirrhosis , Colitis, Crohn s, Hepatitis A, Hepatitis B, Hepatitis C Endocrine Patient has history of Type II Diabetes Genitourinary Denies history of End Stage Renal Disease Immunological Denies history of Lupus Erythematosus, Raynaud s, Scleroderma Integumentary (Skin) Denies history of History of Burn, History of pressure wounds Musculoskeletal Denies history of Gout, Rheumatoid Arthritis, Osteoarthritis, Osteomyelitis Neurologic Patient has history of Neuropathy Denies history of Dementia, Quadriplegia, Paraplegia, Seizure Disorder Oncologic Denies history of Received Chemotherapy, Received Radiation Psychiatric Denies history of Anorexia/bulimia, Confinement Anxiety Patient is treated with Insulin. Blood  sugar is tested. Review of Systems (ROS) Constitutional Symptoms (General Health) Denies complaints or symptoms of Fatigue, Fever, Chills, Marked Weight Change. Eyes Complains or has symptoms of Glasses / Contacts. Denies complaints or symptoms of Dry Eyes, Vision Changes. Ear/Nose/Mouth/Throat Denies complaints or symptoms of Difficult clearing ears, Sinusitis. Hematologic/Lymphatic Denies complaints or symptoms of Bleeding / Clotting Disorders, Human Immunodeficiency Virus. Respiratory Denies complaints or symptoms of Chronic or frequent coughs, Shortness of Breath. Cardiovascular Denies complaints or symptoms of Chest pain, LE edema. Gastrointestinal Denies complaints or  symptoms of Frequent diarrhea, Nausea, Vomiting. Endocrine Denies complaints or symptoms of Hepatitis, Thyroid disease, Polydypsia (Excessive Thirst). Genitourinary Denies complaints or symptoms of Kidney failure/ Dialysis, Incontinence/dribbling. Immunological Denies complaints or symptoms of Hives, Itching. Integumentary (Skin) Complains or has symptoms of Wounds. Denies complaints or symptoms of Bleeding or bruising tendency, Breakdown, Swelling. Musculoskeletal Denies complaints or symptoms of Muscle Pain, Muscle Weakness. Neurologic Denies complaints or symptoms of Numbness/parasthesias, Focal/Weakness. Psychiatric Denies complaints or symptoms of Anxiety, Claustrophobia. JONIEL, GRAUMANN ([id number]) Objective Constitutional patient is hypertensive.. pulse regular and within target range for patient.[name] respirations regular, non-labored and within target range for patient.[name] temperature within target range for patient.. Well-nourished and well-hydrated in no acute distress. Vitals Time Taken: 8:19 AM, Height: 73 in, Source: Stated, Weight: 250 lbs, Source: Stated, BMI: 33, Temperature: 98.7 F, Pulse: 63 bpm, Respiratory Rate: 16 breaths/min, Blood Pressure: 159/99 mmHg. Eyes conjunctiva [name] no eyelid edema noted. pupils equal round and reactive to light and accommodation. Ears, Nose, Mouth, and Throat no gross abnormality of ear auricles or external auditory canals. normal hearing noted during conversation. mucus membranes moist. Respiratory normal breathing without difficulty. [name] to auscultation bilaterally. Cardiovascular regular rate and rhythm with normal S1, S2. no clubbing, cyanosis, significant edema, Gastrointestinal (GI) soft, non-tender, non-distended, +BS. no ventral hernia noted. Musculoskeletal normal gait and posture. no significant deformity or arthritic changes, no loss or range of motion, no clubbing. Psychiatric this patient is able to make decisions and  demonstrates good insight into disease process. Alert and Oriented x 3. pleasant and cooperative. General Notes: Upon inspection today patient's wound bed actually showed signs of some slough noted on the surface of the wound which did require some sharp debridement. I was able to perform sharp debridement to [name] away the necrotic tissue and the wound did not really seem to be significantly deeper post debridement which was good news. I am going to suggest at this point that we likely need to consider some potential offloading I think wearing shoes even better in shoes seems to bother him right now with the pain I think that simply due to the fact that it is rubbing on this region. I think a heel offloading shoe would be appropriate but I think he really needs one with the back rub. Unfortunately we do not have 1 of those in the office. Integumentary (Hair, Skin) Wound #1 status is Open. Original cause of wound was Gradually Appeared. The wound is located on the Right Calcaneus. The wound measures 1cm length x 0.6cm width x 0.1cm depth; 0.471cm^2 area and 0.047cm^3 volume. There is Fat Layer (Subcutaneous Tissue) Exposed exposed. There is no tunneling or undermining noted. There is a medium amount of serosanguineous drainage noted. The wound margin is flat and intact. There is medium (34-66%) pink granulation within the wound bed. There is a medium (34-66%) amount of necrotic tissue within the wound bed including Eschar and Adherent Slough. Assessment JIAN, HODGMAN ([id number]) Active Problems ICD-10 Type 2  diabetes mellitus with other skin ulcer Non-pressure chronic ulcer of right heel and midfoot with fat layer exposed Essential (primary) hypertension Procedures Wound #1 Pre-procedure diagnosis of Wound #1 is a Diabetic Wound/Ulcer of the Lower Extremity located on the Right Calcaneus .Severity of Tissue Pre Debridement is: Fat layer exposed. There was a Excisional Skin/Subcutaneous  Tissue Debridement with a total area of 0.6 sq cm performed by STONE III, Bri Wakeman E., PA-C. With the following instrument(s): Curette to remove Viable and Non-Viable tissue/material. Material removed includes Subcutaneous Tissue and Slough and after achieving pain control using Lidocaine 4% Topical Solution. No specimens were taken. A time out was conducted at 09:09, prior to the start of the procedure. A Minimum amount of bleeding was controlled with Pressure. The procedure was tolerated well with a pain level of 0 throughout and a pain level of 0 following the procedure. Post Debridement Measurements: 1cm length x 0.6cm width x 0.2cm depth; 0.094cm^3 volume. Character of Wound/Ulcer Post Debridement is improved. Severity of Tissue Post Debridement is: Fat layer exposed. Post procedure Diagnosis Wound #1: Same as Pre-Procedure Plan Wound Cleansing: Wound #1 Right Calcaneus: Dial antibacterial soap, wash wounds, rinse and pat dry prior to dressing wounds May Shower, gently pat wound dry prior to applying new dressing. Primary Wound Dressing: Wound #1 Right Calcaneus: Hydrafera Blue Ready Transfer Secondary Dressing: Wound #1 Right Calcaneus: [place] Dressing Change Frequency: Wound #1 Right Calcaneus: Change Dressing Monday, Wednesday, Friday Follow-up Appointments: Wound #1 Right Calcaneus: Return Appointment in 2 weeks. Off-Loading: Wound #1 Right Calcaneus: Other: - You can purchase a heel offloading shoe 1. I am in a suggest at this point that we go ahead and initiate treatment with Hydrofera Blue due to some of the hyper granular tissue I noted upon evaluation today. MANCIL, PFENNING ([id number]) 2. I am also going to recommend a heel offloading shoe we gave him a picture name of what this is he can check with a local medical supply company [facility] to see if this is something they have if not then I think the best option may be to order online I am sure he can find  this on [employer] as well. 3. I would recommend that we cover the area with a bordered foam dressing as well I think this will provide some additional protection as well as catching any of the excess drainage. We will see patient back for reevaluation in 2 weeks here in the clinic. If anything worsens or changes patient will contact our office for additional recommendations. Electronic Signature(s) Signed: 07/02/2019 4:58:50 PM By: Lenda Kelp PA-C Entered By: Lenda Kelp on 07/02/2019 16:58:50 Chris [name] ([id number]) -------------------------------------------------------------------------------- ROS/PFSH Details Patient Name: Chris [name] Date of Service: 07/02/2019 8:00 AM Medical Record Number: [id number] Patient Account Number: [id number] Date of Birth/Sex: June 29, 1941 (77 y.o. M) Treating RN: Rodell Perna Primary Care Provider: Lenda Kelp Other Clinician: Referring Provider: Helane Gunther Treating Provider/Extender: Linwood Dibbles, Ceceilia Cephus Weeks in Treatment: 0 Information Obtained From Patient Constitutional Symptoms (General Health) Complaints and Symptoms: Negative for: Fatigue; Fever; Chills; Marked Weight Change Eyes Complaints and Symptoms: Positive for: Glasses / Contacts Negative for: Dry Eyes; Vision Changes Medical History: Positive for: Cataracts Negative for: Glaucoma; Optic Neuritis Ear/Nose/Mouth/Throat Complaints and Symptoms: Negative for: Difficult clearing ears; Sinusitis Medical History: Negative for: Chronic sinus problems/congestion; Middle ear problems Hematologic/Lymphatic Complaints and Symptoms: Negative for: Bleeding / Clotting Disorders; Human Immunodeficiency Virus Medical History: Negative for: Anemia; Hemophilia; Human Immunodeficiency Virus; Lymphedema; Sickle Cell Disease Respiratory  Complaints and Symptoms: Negative for: Chronic or frequent coughs; Shortness of Breath Medical History: Negative for: Aspiration; Asthma; Chronic  Obstructive Pulmonary Disease (COPD); Pneumothorax; Sleep Apnea; Tuberculosis Cardiovascular Complaints and Symptoms: Negative for: Chest pain; LE edema Medical History: Positive for: Coronary Artery Disease; Hypertension Negative for: Angina; Arrhythmia; Congestive Heart Failure; Deep Vein Thrombosis; Hypotension; Myocardial Infarction; ALISTER, STAVER ([id number]) Peripheral Arterial Disease; Peripheral Venous Disease; Phlebitis; Vasculitis Gastrointestinal Complaints and Symptoms: Negative for: Frequent diarrhea; Nausea; Vomiting Medical History: Negative for: Cirrhosis ; Colitis; Crohnos; Hepatitis A; Hepatitis B; Hepatitis C Endocrine Complaints and Symptoms: Negative for: Hepatitis; Thyroid disease; Polydypsia (Excessive Thirst) Medical History: Positive for: Type II Diabetes Time with diabetes: 30 years Treated with: Insulin Blood sugar tested every day: Yes Tested : Genitourinary Complaints and Symptoms: Negative for: Kidney failure/ Dialysis; Incontinence/dribbling Medical History: Negative for: End Stage Renal Disease Immunological Complaints and Symptoms: Negative for: Hives; Itching Medical History: Negative for: Lupus Erythematosus; Raynaudos; Scleroderma Integumentary (Skin) Complaints and Symptoms: Positive for: Wounds Negative for: Bleeding or bruising tendency; Breakdown; Swelling Medical History: Negative for: History of Burn; History of pressure wounds Musculoskeletal Complaints and Symptoms: Negative for: Muscle Pain; Muscle Weakness Medical History: Negative for: Gout; Rheumatoid Arthritis; Osteoarthritis; Osteomyelitis Neurologic Complaints and Symptoms: Negative for: Numbness/parasthesias; Focal/Weakness GIO, JANOSKI ([id number]) Medical History: Positive for: Neuropathy Negative for: Dementia; Quadriplegia; Paraplegia; Seizure Disorder Psychiatric Complaints and Symptoms: Negative for: Anxiety; Claustrophobia Medical History: Negative for:  Anorexia/bulimia; Confinement Anxiety Oncologic Medical History: Negative for: Received Chemotherapy; Received Radiation HBO Extended History Items Eyes: Cataracts Immunizations Pneumococcal Vaccine: Received Pneumococcal Vaccination: Yes Implantable Devices None Family and Social History Cancer: No; Diabetes: Yes - Mother,Maternal Grandparents; Heart Disease: No; Hereditary Spherocytosis: No; Hypertension: No; Kidney Disease: No; Lung Disease: No; Seizures: No; Stroke: No; Thyroid Problems: No; Tuberculosis: No; Never smoker; Marital Status - Married; Alcohol Use: Never; Drug Use: No History; Caffeine Use: Never; Financial Concerns: No; Food, Clothing or Shelter Needs: No; Support System Lacking: No; Transportation Concerns: No Electronic Signature(s) Signed: 07/02/2019 5:06:41 PM By: Lenda Kelp PA-C Signed: 07/12/2019 8:11:06 AM By: Rodell Perna Entered By: Rodell Perna on 07/02/2019 08:26:14 Chris [name] ([id number]) -------------------------------------------------------------------------------- SuperBill Details Patient Name: Chris [name] Date of Service: 07/02/2019 Medical Record Number: [id number] Patient Account Number: [id number] Date of Birth/Sex: 1941/02/14 (77 y.o. 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 I10 Essential (primary) hypertension Facility Procedures CPT4 Code: 01779390 Description: 99213 - WOUND CARE VISIT-LEV 3 EST PT Modifier: Quantity: 1 CPT4 Code: 30092330 Description: 11042 - DEB SUBQ TISSUE 20 SQ CM/< ICD-10 Diagnosis Description 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 Modifier:  layer exposed Quantity: 1 Physician Procedures CPT4 Code: 0762263 Description: WC PHYS LEVEL 3 o NEW PT ICD-10 Diagnosis Description 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

## 2019-07-12 NOTE — Progress Notes (Signed)
HERSHEY, KNAUER ([id number]) Visit Report for 07/02/2019 Abuse/Suicide Risk Screen Details Patient Name: Chris [name], Chris [name] Date of Service: 07/02/2019 8:00 AM Medical Record Number: [id number] Patient Account Number: [id number] Date of Birth/Sex: March 26, 1941 (77 y.o. M) Treating RN: Army Melia Primary Care Johncarlo Maalouf: Peri Jefferson Other Clinician: Referring Zailen Albarran: Gardiner Barefoot Treating Swan Zayed/Extender: Melburn Hake, HOYT Weeks in Treatment: 0 Abuse/Suicide Risk Screen Items Answer ABUSE RISK SCREEN: Has anyone close to you tried to hurt or harm you recentlyo No Do you feel uncomfortable with anyone in your familyo No Has anyone forced you do things that you didnot want to doo No Electronic Signature(s) Signed: 07/12/2019 8:11:06 AM By: Army Melia Entered By: Army Melia on 07/02/2019 62:69:48 Chris [name] ([id number]) -------------------------------------------------------------------------------- Activities of Daily Living Details Patient Name: Chris [name] Date of Service: 07/02/2019 8:00 AM Medical Record Number: [id number] Patient Account Number: [id number] Date of Birth/Sex: 1940-11-05 (77 y.o. M) Treating RN: Army Melia Primary Care Jahron Hunsinger: Peri Jefferson Other Clinician: Referring Tamikka Pilger: Gardiner Barefoot Treating Jaquise Faux/Extender: Melburn Hake, HOYT Weeks in Treatment: 0 Activities of Daily Living Items Answer Activities of Daily Living (Please select one for each item) Drive Automobile Not Able Take Medications Completely Able Use Telephone Completely Able Care for Appearance Completely Able Use Toilet Completely Able Bath / Shower Completely Able Dress Self Completely Able Feed Self Completely Able Walk Completely Able Get In / Out Bed Completely Able Housework Completely Able Prepare Meals Completely [place] for Self Completely Able Electronic Signature(s) Signed: 07/12/2019 8:11:06 AM By: Army Melia Entered By: Army Melia on 07/02/2019 08:26:35 Chris [name] ([id number]) -------------------------------------------------------------------------------- Education Screening Details Patient Name: Chris [name] Date of Service: 07/02/2019 8:00 AM Medical Record Number: [id number] Patient Account Number: [id number] Date of Birth/Sex: 30-Dec-1940 (77 y.o. M) Treating RN: Army Melia Primary Care Trust Crago: Peri Jefferson Other Clinician: Referring Hillari Zumwalt: Gardiner Barefoot Treating Aryaa Bunting/Extender: Melburn Hake, HOYT Weeks in Treatment: 0 Learning Preferences/Education Level/Primary Language Learning Preference: Explanation, Demonstration Highest Education Level: High School Preferred Language: English Cognitive Barrier Language Barrier: No Translator Needed: No Memory Deficit: No Emotional Barrier: No Cultural/Religious Beliefs Affecting Medical Care: No Physical Barrier Impaired Vision: No Impaired Hearing: No Decreased Hand dexterity: No Knowledge/Comprehension Knowledge Level: High Comprehension Level: High Ability to understand written High instructions: Ability to understand verbal High instructions: Motivation Anxiety Level: Calm Cooperation: Cooperative Education Importance: Acknowledges Need Interest in Health Problems: Asks Questions Perception: Coherent Willingness to Engage in Self- High Management Activities: Readiness to Engage in Self- High Management Activities: Electronic Signature(s) Signed: 07/12/2019 8:11:06 AM By: Army Melia Entered By: Army Melia on 07/02/2019 08:26:53 Chris [name] ([id number]) -------------------------------------------------------------------------------- Fall Risk Assessment Details Patient Name: Chris [name] Date of Service: 07/02/2019 8:00 AM Medical Record Number: [id number] Patient Account Number: [id number] Date of Birth/Sex: 1941/05/07 (78 y.o. M) Treating RN: Army Melia Primary Care Sion Thane: Peri Jefferson Other  Clinician: Referring Earnestine Tuohey: Gardiner Barefoot Treating Scarlette Hogston/Extender: Melburn Hake, HOYT Weeks in Treatment: 0 Fall Risk Assessment Items Have you had 2 or more falls in the last 12 monthso 0 No Have you had any fall that resulted in injury in the last 12 monthso 0 No FALLS RISK SCREEN History of falling - immediate or within 3 months 0 No Secondary diagnosis (Do you have 2 or more medical diagnoseso) 0 No Ambulatory aid None/bed rest/wheelchair/nurse 0 No Crutches/cane/walker 0 No Furniture 0 No Intravenous therapy Access/Saline/Heparin Lock 0 No Gait/Transferring Normal/ bed rest/ wheelchair 0 No Weak (short steps with or without shuffle, stooped but able to lift head  while 0 No walking, may seek support from furniture) Impaired (short steps with shuffle, may have difficulty arising from chair, head 0 No down, impaired balance) Mental Status Oriented to own ability 0 No Electronic Signature(s) Signed: 07/12/2019 8:11:06 AM By: Rodell Perna Entered By: Rodell Perna on 07/02/2019 08:27:01 Chris [name] ([id number]) -------------------------------------------------------------------------------- Foot Assessment Details Patient Name: Chris [name] Date of Service: 07/02/2019 8:00 AM Medical Record Number: [id number] Patient Account Number: [id number] Date of Birth/Sex: [birth year] (77 y.o. M) Treating RN: Rodell Perna Primary Care Lakiya Cottam: Lenda Kelp Other Clinician: Referring Cayne Yom: Helane Gunther Treating Shiven Junious/Extender: Linwood Dibbles, HOYT Weeks in Treatment: 0 Foot Assessment Items Site Locations + = Sensation present, - = Sensation absent, C = Callus, U = Ulcer R = Redness, W = Warmth, M = Maceration, PU = Pre-ulcerative lesion F = Fissure, S = Swelling, D = Dryness Assessment Right: Left: Other Deformity: No No Prior Foot Ulcer: No No Prior Amputation: No No Charcot Joint: No No Ambulatory Status: Ambulatory With Help Assistance Device: Cane Gait:  Steady Electronic Signature(s) Signed: 07/12/2019 8:11:06 AM By: Rodell Perna Entered By: Rodell Perna on 07/02/2019 08:29:56 Chris [name] ([id number]) -------------------------------------------------------------------------------- Nutrition Risk Screening Details Patient Name: Chris [name] Date of Service: 07/02/2019 8:00 AM Medical Record Number: [id number] Patient Account Number: [id number] Date of Birth/Sex: 03-21-1941 (77 y.o. M) Treating RN: Rodell Perna Primary Care Pascal Stiggers: Lenda Kelp Other Clinician: Referring Nemesis Rainwater: Helane Gunther Treating Susen Haskew/Extender: Linwood Dibbles, HOYT Weeks in Treatment: 0 Height (in): 73 Weight (lbs): 250 Body Mass Index (BMI): 33 Nutrition Risk Screening Items Score Screening NUTRITION RISK SCREEN: I have an illness or condition that made me change the kind and/or amount of 0 No food I eat I eat fewer than two meals per day 0 No I eat few fruits and vegetables, or milk products 0 No I have three or more drinks of beer, liquor or wine almost every day 0 No I have tooth or mouth problems that make it hard for me to eat 0 No I don't always have enough money to buy the food I need 0 No I eat alone most of the time 0 No I take three or more different prescribed or over-the-counter drugs a day 0 No Without wanting to, I have lost or gained 10 pounds in the last six months 0 No I am not always physically able to shop, cook and/or feed myself 0 No Nutrition Protocols Good Risk Protocol 0 No interventions needed Moderate Risk Protocol High Risk Proctocol Risk Level: Good Risk Score: 0 Electronic Signature(s) Signed: 07/12/2019 8:11:06 AM By: Rodell Perna Entered By: Rodell Perna on 07/02/2019 08:27:06

## 2019-07-16 ENCOUNTER — Encounter: Payer: Medicare Other | Admitting: Physician Assistant

## 2019-07-16 ENCOUNTER — Other Ambulatory Visit: Payer: Self-pay

## 2019-07-16 DIAGNOSIS — R42 Dizziness and giddiness: Secondary | ICD-10-CM | POA: Diagnosis not present

## 2019-07-16 DIAGNOSIS — G459 Transient cerebral ischemic attack, unspecified: Secondary | ICD-10-CM | POA: Diagnosis not present

## 2019-07-16 NOTE — Progress Notes (Addendum)
Chris [name], Chris [name] ([id number]) Visit Report for 07/16/2019 Chief Complaint Document Details Patient Name: Chris [name], Chris [name] Date of Service: 07/16/2019 9:00 AM Medical Record Number: [id number] Patient Account Number: [id number] Date of Birth/Sex: 1941-04-23 (77 y.o. M) Treating RN: Curtis Sitesorthy, Joanna Primary Care Provider: Lenda KelpPATEL, BANSARI Other Clinician: Referring Provider: Lenda KelpPATEL, BANSARI Treating Provider/Extender: Linwood DibblesSTONE III, Abbygale Lapid Weeks in Treatment: 2 Information Obtained from: Patient Chief Complaint Right heel ulcer Electronic Signature(s) Signed: 07/16/2019 9:19:24 AM By: Lenda KelpStone III, Ryosuke Ericksen PA-C Entered By: Lenda KelpStone III, Yuvraj Pfeifer on 07/16/2019 09:19:24 Chris [name], Chris [name] ([id number]) -------------------------------------------------------------------------------- Debridement Details Patient Name: Chris [name], Chris [name] Date of Service: 07/16/2019 9:00 AM Medical Record Number: [id number] Patient Account Number: [id number] Date of Birth/Sex: 1941-04-23 (77 y.o. M) Treating RN: Curtis Sitesorthy, Joanna Primary Care Provider: Lenda KelpPATEL, BANSARI Other Clinician: Referring Provider: Lenda KelpPATEL, BANSARI Treating Provider/Extender: Linwood DibblesSTONE III, Keaton Beichner Weeks in Treatment: 2 Debridement Performed for Wound #1 Right Calcaneus Assessment: Performed By: Physician STONE III, Trayden Brandy E., PA-C Debridement Type: Debridement Severity of Tissue Pre Fat layer exposed Debridement: Level of Consciousness (Pre- Awake and Alert procedure): Pre-procedure Verification/Time Yes - 09:30 Out Taken: Start Time: 09:30 Pain Control: Lidocaine 4% Topical Solution Total Area Debrided (L x W): 0.9 (cm) x 0.6 (cm) = 0.54 (cm) Tissue and other material Viable, Non-Viable, Callus, Slough, Subcutaneous, Skin: Epidermis, Slough debrided: Level: Skin/Subcutaneous Tissue Debridement Description: Excisional Instrument: Curette Bleeding: Minimum Hemostasis Achieved: Pressure End Time: 09:33 Procedural Pain: 0 Post Procedural Pain: 0 Response to Treatment:  Procedure was tolerated well Level of Consciousness Awake and Alert (Post-procedure): Post Debridement Measurements of Total Wound Length: (cm) 0.9 Width: (cm) 0.6 Depth: (cm) 0.2 Volume: (cm) 0.085 Character of Wound/Ulcer Post Debridement: Improved Severity of Tissue Post Debridement: Fat layer exposed Post Procedure Diagnosis Same as Pre-procedure Electronic Signature(s) Signed: 07/16/2019 5:17:10 PM By: Curtis Sitesorthy, Joanna Signed: 07/17/2019 1:07:32 AM By: Lenda KelpStone III, Kimori Tartaglia PA-C Entered By: Curtis Sitesorthy, Joanna on 07/16/2019 09:33:32 Chris [name], Chris [name] ([id number]) -------------------------------------------------------------------------------- HPI Details Patient Name: Chris [name], Chris [name] Date of Service: 07/16/2019 9:00 AM Medical Record Number: [id number] Patient Account Number: [id number] Date of Birth/Sex: 1941-04-23 (77 y.o. M) Treating RN: Curtis Sitesorthy, Joanna Primary Care Provider: Lenda KelpPATEL, BANSARI Other Clinician: Referring Provider: Lenda KelpPATEL, BANSARI Treating Provider/Extender: Linwood DibblesSTONE III, Juniel Groene Weeks in Treatment: 2 History of Present Illness HPI Description: 07/02/2019 upon evaluation today patient presents for initial inspection concerning a wound that he has on the posterior aspect of his right heel. This has been present he tells me since earlier summer 2020. He does have a history of diabetes mellitus type 2 as well as hypertension. He has been seeing podiatry and was subsequently referred from podiatry to us for further evaluation and treatment due to the nonhealing wound. He states he is also been having some unfortunate pain although I believe this may be more of a nerve pain than anything else. There is no signs of infection and overall I am actually somewhat pleased with the appearance of what I see although there is some slough noted on the surface of the wound I think there may be some things we can do to speed along his recovery. No fevers, chills, nausea, vomiting, or diarrhea.  The patient's blood sugar is under fairly good control with a hemoglobin A1c of 7.5 that was taken last on September 3. He was given Keflex as well although he is done with that. It did not seem to change much 1 way or another. 07/16/2019 on evaluation today patient actually appears to be doing much better with regard to his lower extremity wound on the heel.  This is on the right. He has been tolerating the dressing changes which is the [facility] without complication and this seems to have done a great job for him. There is no signs of active infection at this time. No fevers, chills, nausea, vomiting, or diarrhea. Electronic Signature(s) Signed: 07/16/2019 9:48:10 AM By: Worthy Keeler PA-C Entered By: Worthy Keeler on 07/16/2019 09:48:10 Chris [name] ([id number]) -------------------------------------------------------------------------------- Physical Exam Details Patient Name: Chris [name] Date of Service: 07/16/2019 9:00 AM Medical Record Number: [id number] Patient Account Number: [id number] Date of Birth/Sex: December 29, 1940 (77 y.o. M) Treating RN: Montey Hora Primary Care Provider: Peri Jefferson Other Clinician: Referring Provider: Peri Jefferson Treating Provider/Extender: STONE III, Jaquann Guarisco Weeks in Treatment: 2 Constitutional Well-nourished and well-hydrated in no acute distress. Respiratory normal breathing without difficulty. Psychiatric this patient is able to make decisions and demonstrates good insight into disease process. Alert and Oriented x 3. pleasant and cooperative. Notes His wound bed currently today did require some sharp debridement to [name] the wound necrotic tissue he tolerated this without complication and post debridement wound bed appears to be doing much better. Overall I believe the [facility] is still the appropriate dressing to use for him in my opinion. Electronic Signature(s) Signed: 07/16/2019 9:48:35 AM By: Worthy Keeler PA-C Entered  By: Worthy Keeler on 07/16/2019 09:48:35 Chris [name] ([id number]) -------------------------------------------------------------------------------- Physician Orders Details Patient Name: Chris [name] Date of Service: 07/16/2019 9:00 AM Medical Record Number: [id number] Patient Account Number: [id number] Date of Birth/Sex: Jun 24, 1941 (77 y.o. 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 Primary Wound Dressing Wound #1 Right Calcaneus o Hydrafera Blue Ready Transfer Secondary Dressing Wound #1 Right Calcaneus o [place] Dressing Change Frequency Wound #1 Right Calcaneus o Change Dressing Monday, Wednesday, Friday Follow-up Appointments Wound #1 Right Calcaneus o Return Appointment in 2 weeks. Off-Loading Wound #1 Right Calcaneus o Other: - You can purchase a heel offloading shoe Electronic Signature(s) Signed: 07/16/2019 5:17:10 PM By: Montey Hora Signed: 07/17/2019 1:07:32 AM By: Worthy Keeler PA-C Entered By: Montey Hora on 07/16/2019 09:36:18 Chris [name] ([id number]) -------------------------------------------------------------------------------- Problem List Details Patient Name: Chris [name] Date of Service: 07/16/2019 9:00 AM Medical Record Number: [id number] Patient Account Number: [id number] Date of Birth/Sex: 03-20-1941 (77 y.o. M) Treating RN: Montey Hora Primary Care Provider: Peri Jefferson Other Clinician: Referring Provider: Peri Jefferson Treating Provider/Extender: Melburn Hake, Zafira Munos Weeks in Treatment: 2 Active Problems ICD-10 Evaluated Encounter Code Description Active Date Today Diagnosis E11.622 Type 2 diabetes mellitus with other skin ulcer 07/02/2019 No Yes 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 07/02/2019 No Yes layer exposed I10 Essential (primary) hypertension 07/02/2019 No Yes Inactive Problems Resolved Problems Electronic Signature(s) Signed: 07/16/2019 9:19:18 AM By: Worthy Keeler PA-C Entered By: Worthy Keeler on 07/16/2019 09:19:17 Chris [name] ([id number]) -------------------------------------------------------------------------------- Progress Note Details Patient Name: Chris [name] Date of Service: 07/16/2019 9:00 AM Medical Record Number: [id number] Patient Account Number: [id number] Date of Birth/Sex: 10/22/1940 (77 y.o. M) Treating RN:  Curtis Sites Primary Care Provider: Lenda Kelp Other Clinician: Referring Provider: Lenda Kelp Treating Provider/Extender: Linwood Dibbles, Dimitrios Balestrieri Weeks in Treatment: 2 Subjective Chief Complaint Information obtained from Patient Right heel ulcer History of Present Illness (HPI) 07/02/2019 upon evaluation today patient presents for initial inspection concerning a wound that he has on the posterior aspect of his right heel. This has been present he tells me since earlier summer 2020. He does have a history of diabetes mellitus type 2 as well as hypertension. He has been seeing podiatry and was subsequently referred from podiatry to [place] for further evaluation and treatment due to the nonhealing wound. He states he is also been having some unfortunate pain although I believe this may be more of a nerve pain than anything else. There is no signs of infection and overall I am actually somewhat pleased with the appearance of what I see  although there is some slough noted on the surface of the wound I think there may be some things we can do to speed along his recovery. No fevers, chills, nausea, vomiting, or diarrhea. The patient's blood sugar is under fairly good control with a hemoglobin A1c of 7.5 that was taken last on September 3. He was given Keflex as well although he is done with that. It did not seem to change much 1 way or another. 07/16/2019 on evaluation today patient actually appears to be doing much better with regard to his lower extremity wound on the heel. This is on the right. He has been tolerating the dressing changes which is the [facility] without complication and this seems to have done a great job for him. There is no signs of active infection at this time. No fevers, chills, nausea, vomiting, or diarrhea. Patient History Information obtained from Patient. Family History Diabetes - Mother,Maternal Grandparents, No family history of Cancer, Heart Disease, Hereditary Spherocytosis, Hypertension, Kidney Disease, Lung Disease, Seizures, Stroke, Thyroid Problems, Tuberculosis. Social History Never smoker, Marital Status - Married, Alcohol Use - Never, Drug Use - No History, Caffeine Use - Never. Medical History Eyes Patient has history of Cataracts Denies history of Glaucoma, Optic Neuritis Ear/Nose/Mouth/Throat Denies history of Chronic sinus problems/congestion, Middle ear problems Hematologic/Lymphatic Denies history of Anemia, Hemophilia, Human Immunodeficiency Virus, Lymphedema, Sickle Cell Disease Respiratory Denies history of Aspiration, Asthma, Chronic Obstructive Pulmonary Disease (COPD), Pneumothorax, Sleep Apnea, Tuberculosis Cardiovascular Patient has history of Coronary Artery Disease, Hypertension Denies history of Angina, Arrhythmia, Congestive Heart Failure, Deep Vein Thrombosis, Hypotension, Myocardial Infarction, Hector, Remo ([id number]) Peripheral Arterial Disease,  Peripheral Venous Disease, Phlebitis, Vasculitis Gastrointestinal Denies history of Cirrhosis , Colitis, Crohn s, Hepatitis A, Hepatitis B, Hepatitis C Endocrine Patient has history of Type II Diabetes Genitourinary Denies history of End Stage Renal Disease Immunological Denies history of Lupus Erythematosus, Raynaud s, Scleroderma Integumentary (Skin) Denies history of History of Burn, History of pressure wounds Musculoskeletal Denies history of Gout, Rheumatoid Arthritis, Osteoarthritis, Osteomyelitis Neurologic Patient has history of Neuropathy Denies history of Dementia, Quadriplegia, Paraplegia, Seizure Disorder Oncologic Denies history of Received Chemotherapy, Received Radiation Psychiatric Denies history of Anorexia/bulimia, Confinement Anxiety Review of Systems (ROS) Constitutional Symptoms (General Health) Denies complaints or symptoms of Fatigue, Fever, Chills, Marked Weight Change. Respiratory Denies complaints or symptoms of Chronic or frequent coughs, Shortness of Breath. Cardiovascular Denies complaints or symptoms of Chest pain, LE edema. Psychiatric Denies complaints or symptoms of Anxiety, Claustrophobia. Objective Constitutional Well-nourished and well-hydrated in no acute distress. Vitals Time Taken: 8:54 AM, Height: 73 in, Weight: 250 lbs,  BMI: 33, Temperature: 97.8 F, Pulse: 68 bpm, Respiratory Rate: 16 breaths/min, Blood Pressure: 162/54 mmHg. Respiratory normal breathing without difficulty. Psychiatric this patient is able to make decisions and demonstrates good insight into disease process. Alert and Oriented x 3. pleasant and cooperative. General Notes: His wound bed currently today did require some sharp debridement to [name] the wound necrotic tissue he tolerated this without complication and post debridement wound bed appears to be doing much better. Overall I believe the [facility] is still the appropriate dressing to use for him in my  opinion. Chris [name], Chris [name] ([id number]) Integumentary (Hair, Skin) Wound #1 status is Open. Original cause of wound was Gradually Appeared. The wound is located on the Right Calcaneus. The wound measures 0.9cm length x 0.6cm width x 0.1cm depth; 0.424cm^2 area and 0.042cm^3 volume. There is Fat Layer (Subcutaneous Tissue) Exposed exposed. There is no tunneling or undermining noted. There is a medium amount of serosanguineous drainage noted. The wound margin is flat and intact. There is medium (34-66%) pink granulation within the wound bed. There is a medium (34-66%) amount of necrotic tissue within the wound bed including Eschar and Adherent Slough. Assessment Active Problems ICD-10 Type 2 diabetes mellitus with other skin ulcer Non-pressure chronic ulcer of right heel and midfoot with fat layer exposed Essential (primary) hypertension Procedures Wound #1 Pre-procedure diagnosis of Wound #1 is a Diabetic Wound/Ulcer of the Lower Extremity located on the Right Calcaneus .Severity of Tissue Pre Debridement is: Fat layer exposed. There was a Excisional Skin/Subcutaneous Tissue Debridement with a total area of 0.54 sq cm performed by STONE III, Foxx Klarich E., PA-C. With the following instrument(s): Curette to remove Viable and Non-Viable tissue/material. Material removed includes Callus, Subcutaneous Tissue, Slough, and Skin: Epidermis after achieving pain control using Lidocaine 4% Topical Solution. No specimens were taken. A time out was conducted at 09:30, prior to the start of the procedure. A Minimum amount of bleeding was controlled with Pressure. The procedure was tolerated well with a pain level of 0 throughout and a pain level of 0 following the procedure. Post Debridement Measurements: 0.9cm length x 0.6cm width x 0.2cm depth; 0.085cm^3 volume. Character of Wound/Ulcer Post Debridement is improved. Severity of Tissue Post Debridement is: Fat layer exposed. Post procedure Diagnosis Wound #1: Same  as Pre-Procedure Plan Wound Cleansing: Wound #1 Right Calcaneus: Dial antibacterial soap, wash wounds, rinse and pat dry prior to dressing wounds May Shower, gently pat wound dry prior to applying new dressing. Primary Wound Dressing: Wound #1 Right Calcaneus: Hydrafera Blue Ready Transfer Secondary Dressing: Wound #1 Right Calcaneus: [place] Dressing Change FrequencyRASTUS, Chris [name] ([id number]) Wound #1 Right Calcaneus: Change Dressing Monday, Wednesday, Friday Follow-up Appointments: Wound #1 Right Calcaneus: Return Appointment in 2 weeks. Off-Loading: Wound #1 Right Calcaneus: Other: - You can purchase a heel offloading shoe 1. My suggestion at this time is going to be that we go ahead and continue with the [facility] dressing since that seems to be doing excellent as far as the wound is concerned I am very pleased in that regard. 2. I would recommend as well that we go ahead and continue with the offloading shoe which does seem to be again helpful with keeping pressure off of the heel. I think that is something that I would still recommend he do. 3. I will also suggest that we go ahead and continue to monitor for any signs of infection obviously there is nothing right now showing up but if anything changes he will contact  the office and let us know. We will see patient back for reevaluation in 2 weeks here in the clinic. If anything worsens or changes patient will contact our office for additional recommendations. Electronic Signature(s) Signed: 07/16/2019 9:49:40 AM By: Lenda Kelp PA-C Entered By: Lenda Kelp on 07/16/2019 09:49:40 Chris [name] ([id number]) -------------------------------------------------------------------------------- ROS/PFSH Details Patient Name: Chris [name] Date of Service: 07/16/2019 9:00 AM Medical Record Number: [id number] Patient Account Number: [id number] Date of Birth/Sex: [birth year] (77 y.o. M) Treating RN: Curtis Sites Primary Care Provider: Lenda Kelp Other Clinician: Referring Provider: Lenda Kelp Treating Provider/Extender: Linwood Dibbles, Klohe Lovering Weeks in Treatment: 2 Information Obtained From Patient Constitutional Symptoms (General Health) Complaints and Symptoms: Negative for: Fatigue; Fever; Chills; Marked Weight Change Respiratory Complaints and Symptoms: Negative for: Chronic or frequent coughs; Shortness of Breath Medical History: Negative for: Aspiration; Asthma; Chronic Obstructive Pulmonary Disease (COPD); Pneumothorax; Sleep Apnea; Tuberculosis Cardiovascular Complaints and Symptoms: Negative for: Chest pain; LE edema Medical History: Positive for: Coronary Artery Disease; Hypertension Negative for: Angina; Arrhythmia; Congestive Heart Failure; Deep Vein Thrombosis; Hypotension; Myocardial Infarction; Peripheral Arterial Disease; Peripheral Venous Disease; Phlebitis; Vasculitis Psychiatric Complaints and Symptoms: Negative for: Anxiety; Claustrophobia Medical History: Negative for: Anorexia/bulimia; Confinement Anxiety Eyes Medical History: Positive for: Cataracts Negative for: Glaucoma; Optic Neuritis Ear/Nose/Mouth/Throat Medical History: Negative for: Chronic sinus problems/congestion; Middle ear problems Hematologic/Lymphatic Medical History: Negative for: Anemia; Hemophilia; Human Immunodeficiency Virus; Lymphedema; Sickle Cell Disease Chris [name], Chris [name] ([id number]) Gastrointestinal Medical History: Negative for: Cirrhosis ; Colitis; Crohnos; Hepatitis A; Hepatitis B; Hepatitis C Endocrine Medical History: Positive for: Type II Diabetes Time with diabetes: 30 years Treated with: Insulin Blood sugar tested every day: Yes Tested : Genitourinary Medical History: Negative for: End Stage Renal Disease Immunological Medical History: Negative for: Lupus Erythematosus; Raynaudos; Scleroderma Integumentary (Skin) Medical History: Negative for: History of Burn;  History of pressure wounds Musculoskeletal Medical History: Negative for: Gout; Rheumatoid Arthritis; Osteoarthritis; Osteomyelitis Neurologic Medical History: Positive for: Neuropathy Negative for: Dementia; Quadriplegia; Paraplegia; Seizure Disorder Oncologic Medical History: Negative for: Received Chemotherapy; Received Radiation HBO Extended History Items Eyes: Cataracts Immunizations Pneumococcal Vaccine: Received Pneumococcal Vaccination: Yes Implantable Devices None Family and Social History Cancer: No; Diabetes: Yes - Mother,Maternal Grandparents; Heart Disease: No; Hereditary Spherocytosis: No; Hypertension: No; Kidney Disease: No; Lung Disease: No; Seizures: No; Stroke: No; Thyroid Problems: No; TuberculosisMAVERYCK, Chris [name] ([id number]) No; Never smoker; Marital Status - Married; Alcohol Use: Never; Drug Use: No History; Caffeine Use: Never; Financial Concerns: No; Food, Clothing or Shelter Needs: No; Support System Lacking: No; Transportation Concerns: No Physician Affirmation I have reviewed and agree with the above information. Electronic Signature(s) Signed: 07/16/2019 5:17:10 PM By: Curtis Sites Signed: 07/17/2019 1:07:32 AM By: Lenda Kelp PA-C Entered By: Lenda Kelp on 07/16/2019 09:48:22 Chris [name], Chris [name] ([id number]) -------------------------------------------------------------------------------- SuperBill Details Patient Name: Chris [name] Date of Service: 07/16/2019 Medical Record Number: [id number] Patient Account Number: [id number] Date of Birth/Sex: 26-Feb-1941 (77 y.o. 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 I10  Essential (primary) hypertension Facility Procedures CPT4 Code: [id number] Description: 11042 - DEB SUBQ TISSUE 20 SQ CM/< ICD-10 Diagnosis Description 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 Modifier: layer exposed Quantity: 1 Physician Procedures CPT4 Code: [id number] Description: 63875 -  WC PHYS SUBQ TISS 20 SQ CM ICD-10 Diagnosis Description 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 Modifier: layer exposed Quantity: 1 Electronic Signature(s) Signed: 07/16/2019 9:49:48 AM By: Lenda Kelp PA-C Entered By: Lenda Kelp on 07/16/2019 09:49:47

## 2019-07-17 NOTE — Progress Notes (Signed)
Chris [name] ([id number]) Visit Report for 07/16/2019 Arrival Information Details Patient Name: Chris [name] Date of Service: 07/16/2019 9:00 AM Medical Record Number: [id number] Patient Account Number: [id number] Date of Birth/Sex: 03/02/1941 (77 y.o. M) Treating RN: Chris [name] Primary Care Chris [name]: Chris [name] Other Clinician: Referring Issacc Merlo: Chris [name] Treating Chris [name]/Extender: Chris [name], Chris [name] Weeks in Treatment: 2 Visit Information History Since Last Visit Added or deleted any medications: No Patient Arrived: Ambulatory Any new allergies or adverse reactions: No Arrival Time: 08:53 Had a fall or experienced change in No Accompanied [name]: self activities of daily living that may affect Transfer Assistance: None risk of falls: Patient Identification Verified: Yes Signs or symptoms of abuse/neglect since last visito No Patient Has Alerts: Yes Hospitalized since last visit: No Patient Alerts: Patient on Blood Thinner Pain Present Now: No Electronic Signature(s) Signed: 07/16/2019 10:17:05 AM [name]: Chris [name] Entered [name]: Chris [name] on 07/16/2019 08:53:52 Chris [name] ([id number]) -------------------------------------------------------------------------------- Encounter Discharge Information Details Patient Name: Chris [name] Date of Service: 07/16/2019 9:00 AM Medical Record Number: [id number] Patient Account Number: [id number] Date of Birth/Sex: 04-Aug-1941 (77 y.o. M) Treating RN: Chris [name] Primary Care Linlee Cromie: Chris [name] Other Clinician: Referring Chris [name]: Chris [name] Treating Chris [name]/Extender: Chris [name], Chris [name] Weeks in Treatment: 2 Encounter Discharge Information Items Post Procedure Vitals Discharge Condition: Stable Temperature (F): 97.8 Ambulatory Status: Ambulatory Pulse (bpm): 68 Discharge Destination: Home Respiratory Rate (breaths/min): 16 Transportation: Private Auto Blood Pressure (mmHg): 162/54 Accompanied [name]:  self Schedule Follow-up Appointment: Yes Clinical Summary of Care: Electronic Signature(s) Signed: 07/16/2019 5:17:10 PM [name]: Chris [name] Entered [name]: Chris [name] on 07/16/2019 09:35:42 Chris [name] ([id number]) -------------------------------------------------------------------------------- Lower Extremity Assessment Details Patient Name: Chris [name] Date of Service: 07/16/2019 9:00 AM Medical Record Number: [id number] Patient Account Number: [id number] Date of Birth/Sex: [birth year] (77 y.o. M) Treating RN: Chris [name] Primary Care Chris [name]: Chris [name] Other Clinician: Referring Encarnacion Scioneaux: Chris [name] Treating Lakasha Mcfall/Extender: Chris [name], Chris [name] Weeks in Treatment: 2 Edema Assessment Assessed: [Left: No] [Right: No] Edema: [Left: N] [Right: o] Vascular Assessment Pulses: Dorsalis Pedis Palpable: [Right:Yes] Electronic Signature(s) Signed: 07/16/2019 10:17:05 AM [name]: Chris [name] Entered [name]: Chris [name] on 07/16/2019 08:56:39 Chris [name] ([id number]) -------------------------------------------------------------------------------- Multi Wound Chart Details Patient Name: Chris [name] Date of Service: 07/16/2019 9:00 AM Medical Record Number: [id number] Patient Account Number: [id number] Date of Birth/Sex: 06/30/1941 (77 y.o. M) Treating RN: Chris [name] Primary Care  Grosser: Chris [name] Other Clinician: Referring Chris [name]: Chris [name] Treating Chris [name]/Extender: Chris [name], Chris [name] Weeks in Treatment: 2 Vital Signs Height(in): 73 Pulse(bpm): 68 Weight(lbs): 250 Blood Pressure(mmHg): 162/54 Body Mass Index(BMI): 33 Temperature(F): 97.8 Respiratory Rate 16 (breaths/min): Photos: [N/A:N/A] Wound Location: Right Calcaneus N/A N/A Wounding Event: Gradually Appeared N/A N/A Primary Etiology: Diabetic Wound/Ulcer of the N/A N/A Lower Extremity Comorbid History: Cataracts, Coronary Artery N/A N/A Disease, Hypertension, Type II Diabetes,  Neuropathy Date Acquired: 04/05/2019 N/A N/A Weeks of Treatment: 2 N/A N/A Wound Status: Open N/A N/A Measurements L x W x D 0.9x0.6x0.1 N/A N/A (cm) Area (cm) : 0.424 N/A N/A Volume (cm) : 0.042 N/A N/A % Reduction in Area: 10.00% N/A N/A % Reduction in Volume: 10.60% N/A N/A Classification: Grade 1 N/A N/A Exudate Amount: Medium N/A N/A Exudate Type: Serosanguineous N/A N/A Exudate Color: red, brown N/A N/A Wound Margin: Flat and Intact N/A N/A Granulation Amount: Medium (34-66%) N/A N/A Granulation Quality: Pink N/A N/A Necrotic Amount: Medium (34-66%) N/A N/A Necrotic Tissue: Eschar, Adherent Slough N/A N/A Exposed Structures: Fat Layer (Subcutaneous N/A N/A Tissue) Exposed: Yes Fascia: No Tendon: No  Chris [name], Chris [name] ([id number]) Muscle: No Joint: No Bone: No Epithelialization: None N/A N/A Treatment Notes Electronic Signature(s) Signed: 07/16/2019 5:17:10 PM [name]: Chris Sitesorthy, Chris [name]: Chris Sitesorthy, Chris on 07/16/2019 09:29:31 Chris [name], Chris [name] ([id number]) -------------------------------------------------------------------------------- Multi-Disciplinary Care Plan Details Patient Name: Chris [name], Chris [name] Date of Service: 07/16/2019 9:00 AM Medical Record Number: [id number] Patient Account Number: [id number] Date of Birth/Sex: 12-05-1940 (77 y.o. M) Treating RN: Chris Sitesorthy, Chris Primary Care Prisca Gearing: Chris KelpPATEL, BANSARI Other Clinician: Referring Monterius Rolf: Chris KelpPATEL, BANSARI Treating Shelah Heatley/Extender: Chris DibblesSTONE [name], Chris [name] Weeks in Treatment: 2 Active Inactive Necrotic Tissue Nursing Diagnoses: Knowledge deficit related to management of necrotic/devitalized tissue Goals: Necrotic/devitalized tissue will be minimized in the wound bed Date Initiated: 07/02/2019 Target Resolution Date: 10/02/2019 Goal Status: Active Interventions: Provide education on necrotic tissue and debridement process Notes: Orientation to the Wound Care Program Nursing Diagnoses: Knowledge deficit related to the  wound healing center program Goals: Patient/caregiver will verbalize understanding of the Wound Healing Center Program Date Initiated: 07/02/2019 Target Resolution Date: 10/02/2019 Goal Status: Active Interventions: Provide education on orientation to the wound center Notes: Peripheral Neuropathy Nursing Diagnoses: Potential alteration in peripheral tissue perfusion (select prior to confirmation of diagnosis) Goals: Patient/caregiver will verbalize understanding of disease process and disease management Date Initiated: 07/02/2019 Target Resolution Date: 10/02/2019 Goal Status: Active Interventions: Assess signs and symptoms of neuropathy upon admission and as needed Chris [name], Chris [name] ([id number]) Notes: Wound/Skin Impairment Nursing Diagnoses: Impaired tissue integrity Goals: Ulcer/skin breakdown will heal within 14 weeks Date Initiated: 07/02/2019 Target Resolution Date: 10/02/2019 Goal Status: Active Interventions: Assess patient/caregiver ability to obtain necessary supplies Assess patient/caregiver ability to perform ulcer/skin care regimen upon admission and as needed Assess ulceration(s) every visit Notes: Electronic Signature(s) Signed: 07/16/2019 5:17:10 PM [name]: Chris Sitesorthy, Chris [name]: Chris Sitesorthy, Chris on 07/16/2019 09:29:25 Chris [name], Kaicen ([id number]) -------------------------------------------------------------------------------- Pain Assessment Details Patient Name: Chris [name], Chris [name] Date of Service: 07/16/2019 9:00 AM Medical Record Number: [id number] Patient Account Number: [id number] Date of Birth/Sex: 12-05-1940 (77 y.o. M) Treating RN: Chris PernaScott, Dajea Primary Care Orvel Cutsforth: Chris KelpPATEL, BANSARI Other Clinician: Referring Estella Malatesta: Chris KelpPATEL, BANSARI Treating Verlie Hellenbrand/Extender: Chris DibblesSTONE [name], Chris [name] Weeks in Treatment: 2 Active Problems Location of Pain Severity and Description of Pain Patient Has Paino No Site Locations Pain Management and Medication Current Pain Management: Electronic  Signature(s) Signed: 07/16/2019 10:17:05 AM [name]: Chris PernaScott, Dajea Entered [name]: Chris PernaScott, Dajea on 07/16/2019 08:53:58 Chris [name], Chris [name] ([id number]) -------------------------------------------------------------------------------- Patient/Caregiver Education Details Patient Name: Chris [name], Chris [name] Date of Service: 07/16/2019 9:00 AM Medical Record Number: [id number] Patient Account Number: [id number] Date of Birth/Gender: 12-05-1940 (77 y.o. M) Treating RN: Chris Sitesorthy, Chris Primary Care Physician: Chris KelpPATEL, BANSARI Other Clinician: Referring Physician: Lenda KelpPATEL, BANSARI Treating Physician/Extender: Skeet SimmerSTONE [name], Chris [name] Weeks in Treatment: 2 Education Assessment Education Provided To: Patient Education Topics Provided Offloading: Handouts: Other: need for heel offloading shoe Methods: Explain/Verbal, Printed Responses: State content correctly Wound/Skin Impairment: Handouts: Other: wound care as ordered Methods: Demonstration, Explain/Verbal Responses: State content correctly Electronic Signature(s) Signed: 07/16/2019 5:17:10 PM [name]: Chris Sitesorthy, Chris [name]: Chris Sitesorthy, Chris on 07/16/2019 09:35:02 Chris [name], Chris [name] ([id number]) -------------------------------------------------------------------------------- Wound Assessment Details Patient Name: Chris [name], Chris [name] Date of Service: 07/16/2019 9:00 AM Medical Record Number: [id number] Patient Account Number: [id number] Date of Birth/Sex: 12-05-1940 (77 y.o. M) Treating RN: Chris PernaScott, Dajea Primary Care Loy Mccartt: Chris KelpPATEL, BANSARI Other Clinician: Referring Hannahgrace Lalli: Chris KelpPATEL, BANSARI Treating Milan Clare/Extender: Chris [name], Chris [name] Weeks in Treatment: 2 Wound Status Wound Number: 1 Primary Diabetic Wound/Ulcer of the Lower Extremity Etiology: Wound Location: Right Calcaneus Wound Open Wounding Event: Gradually Appeared Status: Date Acquired: 04/05/2019 Comorbid Cataracts, Coronary Artery Disease, Weeks Of Treatment: 2  History: Hypertension, Type II Diabetes,  Neuropathy Clustered Wound: No Photos Wound Measurements Length: (cm) 0.9 % Reduction i Width: (cm) 0.6 % Reduction i Depth: (cm) 0.1 Epithelializa Area: (cm) 0.424 Tunneling: Volume: (cm) 0.042 Undermining: n Area: 10% n Volume: 10.6% tion: None No No Wound Description Classification: Grade 1 Foul Odor Af Wound Margin: Flat and Intact Slough/Fibri Exudate Amount: Medium Exudate Type: Serosanguineous Exudate Color: red, brown ter Cleansing: No no Yes Wound Bed Granulation Amount: Medium (34-66%) Exposed Structure Granulation Quality: Pink Fascia Exposed: No Necrotic Amount: Medium (34-66%) Fat Layer (Subcutaneous Tissue) Exposed: Yes Necrotic Quality: Eschar, Adherent Slough Tendon Exposed: No Muscle Exposed: No Joint Exposed: No Bone Exposed: No Treatment Notes BLOXHAM, Lanis ([id number]) Wound #1 (Right Calcaneus) Notes Hydrofera blue and telfa Manufacturing systems engineer) Signed: 07/16/2019 10:17:05 AM [name]: Army Melia Entered [name]: Army Melia on 07/16/2019 08:56:12 Chris [name] ([id number]) -------------------------------------------------------------------------------- Vitals Details Patient Name: Chris [name] Date of Service: 07/16/2019 9:00 AM Medical Record Number: [id number] Patient Account Number: [id number] Date of Birth/Sex: 1941-06-26 (77 y.o. M) Treating RN: Army Melia Primary Care Kilyn Maragh: Peri Jefferson Other Clinician: Referring Garnell Phenix: Peri Jefferson Treating Sunnie Odden/Extender: Melburn Hake, Chris [name] Weeks in Treatment: 2 Vital Signs Time Taken: 08:54 Temperature (F): 97.8 Height (in): 73 Pulse (bpm): 68 Weight (lbs): 250 Respiratory Rate (breaths/min): 16 Body Mass Index (BMI): 33 Blood Pressure (mmHg): 162/54 Reference Range: 80 - 120 mg / dl Electronic Signature(s) Signed: 07/16/2019 10:17:05 AM [name]: Army Melia Entered [name]: Army Melia on 07/16/2019 08:54:51

## 2019-07-18 ENCOUNTER — Other Ambulatory Visit: Payer: Self-pay

## 2019-07-18 ENCOUNTER — Emergency Department: Payer: Medicare Other

## 2019-07-18 ENCOUNTER — Inpatient Hospital Stay
Admission: EM | Admit: 2019-07-18 | Discharge: 2019-07-20 | DRG: 149 | Disposition: A | Discharge status: Home Health | Payer: Medicare Other | Attending: Internal Medicine | Admitting: Internal Medicine

## 2019-07-18 ENCOUNTER — Encounter
Admission: EM | Disposition: A | Discharge status: Home Health | Payer: Self-pay | Source: Home / Self Care | Attending: Internal Medicine

## 2019-07-18 DIAGNOSIS — Z794 Long term (current) use of insulin: Secondary | ICD-10-CM | POA: Diagnosis not present

## 2019-07-18 DIAGNOSIS — I10 Essential (primary) hypertension: Secondary | ICD-10-CM | POA: Diagnosis present

## 2019-07-18 DIAGNOSIS — Z9181 History of falling: Secondary | ICD-10-CM

## 2019-07-18 DIAGNOSIS — Z79899 Other long term (current) drug therapy: Secondary | ICD-10-CM | POA: Diagnosis not present

## 2019-07-18 DIAGNOSIS — H538 Other visual disturbances: Secondary | ICD-10-CM | POA: Diagnosis present

## 2019-07-18 DIAGNOSIS — R29700 NIHSS score 0: Secondary | ICD-10-CM | POA: Diagnosis present

## 2019-07-18 DIAGNOSIS — Z20828 Contact with and (suspected) exposure to other viral communicable diseases: Secondary | ICD-10-CM | POA: Diagnosis present

## 2019-07-18 DIAGNOSIS — Z79890 Hormone replacement therapy (postmenopausal): Secondary | ICD-10-CM | POA: Diagnosis not present

## 2019-07-18 DIAGNOSIS — G459 Transient cerebral ischemic attack, unspecified: Secondary | ICD-10-CM | POA: Diagnosis present

## 2019-07-18 DIAGNOSIS — R9431 Abnormal electrocardiogram [ECG] [EKG]: Secondary | ICD-10-CM | POA: Diagnosis present

## 2019-07-18 DIAGNOSIS — R531 Weakness: Secondary | ICD-10-CM | POA: Diagnosis not present

## 2019-07-18 DIAGNOSIS — E785 Hyperlipidemia, unspecified: Secondary | ICD-10-CM | POA: Diagnosis present

## 2019-07-18 DIAGNOSIS — Z8249 Family history of ischemic heart disease and other diseases of the circulatory system: Secondary | ICD-10-CM | POA: Diagnosis not present

## 2019-07-18 DIAGNOSIS — E119 Type 2 diabetes mellitus without complications: Secondary | ICD-10-CM | POA: Diagnosis present

## 2019-07-18 DIAGNOSIS — I2510 Atherosclerotic heart disease of native coronary artery without angina pectoris: Secondary | ICD-10-CM | POA: Diagnosis present

## 2019-07-18 DIAGNOSIS — E039 Hypothyroidism, unspecified: Secondary | ICD-10-CM | POA: Diagnosis present

## 2019-07-18 DIAGNOSIS — I6523 Occlusion and stenosis of bilateral carotid arteries: Secondary | ICD-10-CM | POA: Diagnosis present

## 2019-07-18 DIAGNOSIS — Z89422 Acquired absence of other left toe(s): Secondary | ICD-10-CM

## 2019-07-18 DIAGNOSIS — R42 Dizziness and giddiness: Secondary | ICD-10-CM

## 2019-07-18 DIAGNOSIS — Z833 Family history of diabetes mellitus: Secondary | ICD-10-CM | POA: Diagnosis not present

## 2019-07-18 DIAGNOSIS — I639 Cerebral infarction, unspecified: Secondary | ICD-10-CM | POA: Diagnosis present

## 2019-07-18 DIAGNOSIS — Z951 Presence of aortocoronary bypass graft: Secondary | ICD-10-CM | POA: Diagnosis not present

## 2019-07-18 DIAGNOSIS — N179 Acute kidney failure, unspecified: Secondary | ICD-10-CM | POA: Diagnosis present

## 2019-07-18 DIAGNOSIS — R4182 Altered mental status, unspecified: Secondary | ICD-10-CM | POA: Diagnosis present

## 2019-07-18 DIAGNOSIS — D649 Anemia, unspecified: Secondary | ICD-10-CM | POA: Diagnosis present

## 2019-07-18 HISTORY — DX: Hyperlipidemia, unspecified: E78.5

## 2019-07-18 HISTORY — DX: Type 2 diabetes mellitus without complications: E11.9

## 2019-07-18 HISTORY — DX: Essential (primary) hypertension: I10

## 2019-07-18 LAB — COMPREHENSIVE METABOLIC PANEL
ALT: 43 U/L (ref 0–44)
AST: 35 U/L (ref 15–41)
Albumin: 3.7 g/dL (ref 3.5–5.0)
Alkaline Phosphatase: 93 U/L (ref 38–126)
Anion gap: 13 (ref 5–15)
BUN: 27 mg/dL — ABNORMAL HIGH (ref 8–23)
CO2: 24 mmol/L (ref 22–32)
Calcium: 8.9 mg/dL (ref 8.9–10.3)
Chloride: 105 mmol/L (ref 98–111)
Creatinine, Ser: 1.64 mg/dL — ABNORMAL HIGH (ref 0.61–1.24)
GFR calc Af Amer: 46 mL/min — ABNORMAL LOW (ref >60)
GFR calc non Af Amer: 40 mL/min — ABNORMAL LOW (ref >60)
Glucose, Bld: 132 mg/dL — ABNORMAL HIGH (ref 70–99)
Potassium: 4.3 mmol/L (ref 3.5–5.1)
Sodium: 142 mmol/L (ref 135–145)
Total Bilirubin: 0.6 mg/dL (ref 0.3–1.2)
Total Protein: 7.1 g/dL (ref 6.5–8.1)

## 2019-07-18 LAB — CBC WITH DIFFERENTIAL/PLATELET
Abs Immature Granulocytes: 0.01 10*3/uL (ref 0.00–0.07)
Basophils Absolute: 0 10*3/uL (ref 0.0–0.1)
Basophils Relative: 1 %
Eosinophils Absolute: 0.2 10*3/uL (ref 0.0–0.5)
Eosinophils Relative: 3 %
HCT: 38.1 % — ABNORMAL LOW (ref 39.0–52.0)
Hemoglobin: 12.7 g/dL — ABNORMAL LOW (ref 13.0–17.0)
Immature Granulocytes: 0 %
Lymphocytes Relative: 30 %
Lymphs Abs: 2 10*3/uL (ref 0.7–4.0)
MCH: 30.2 pg (ref 26.0–34.0)
MCHC: 33.3 g/dL (ref 30.0–36.0)
MCV: 90.5 fL (ref 80.0–100.0)
Monocytes Absolute: 0.7 10*3/uL (ref 0.1–1.0)
Monocytes Relative: 10 %
Neutro Abs: 3.8 10*3/uL (ref 1.7–7.7)
Neutrophils Relative %: 56 %
Platelets: 158 10*3/uL (ref 150–400)
RBC: 4.21 MIL/uL — ABNORMAL LOW (ref 4.22–5.81)
RDW: 13 % (ref 11.5–15.5)
WBC: 6.7 10*3/uL (ref 4.0–10.5)
nRBC: 0 % (ref 0.0–0.2)

## 2019-07-18 LAB — PROTIME-INR
INR: 1.1 (ref 0.8–1.2)
Prothrombin Time: 14.2 seconds (ref 11.4–15.2)

## 2019-07-18 LAB — APTT: aPTT: 34 seconds (ref 24–36)

## 2019-07-18 LAB — TROPONIN I (HIGH SENSITIVITY): Troponin I (High Sensitivity): 10 ng/L (ref <18)

## 2019-07-18 SURGERY — CORONARY/GRAFT ACUTE MI REVASCULARIZATION
Anesthesia: Moderate Sedation

## 2019-07-18 MED ORDER — TRAZODONE HCL 50 MG PO TABS: 25.0000 mg | ORAL_TABLET | Freq: Every evening | ORAL | Status: DC | PRN

## 2019-07-18 MED ORDER — SODIUM CHLORIDE 0.9 % IV SOLN
INTRAVENOUS | Status: DC
  Administered 2019-07-19 (×2): via INTRAVENOUS

## 2019-07-18 MED ORDER — LEVOTHYROXINE SODIUM 88 MCG PO TABS
88.0000 ug | ORAL_TABLET | Freq: Every day | ORAL | Status: DC
  Administered 2019-07-19 – 2019-07-20 (×2): 88 ug via ORAL
  Filled 2019-07-18 (×2): qty 1

## 2019-07-18 MED ORDER — ATORVASTATIN CALCIUM 20 MG PO TABS: 40.0000 mg | ORAL_TABLET | Freq: Every day | ORAL | Status: DC

## 2019-07-18 MED ORDER — PENTOXIFYLLINE ER 400 MG PO TBCR
400.0000 mg | EXTENDED_RELEASE_TABLET | Freq: Two times a day (BID) | ORAL | Status: DC
  Administered 2019-07-19 – 2019-07-20 (×3): 400 mg via ORAL
  Filled 2019-07-18 (×4): qty 1

## 2019-07-18 MED ORDER — VITAMIN B-12 1000 MCG PO TABS
2500.0000 ug | ORAL_TABLET | Freq: Every day | ORAL | Status: DC
  Administered 2019-07-19 – 2019-07-20 (×2): 2500 ug via ORAL
  Filled 2019-07-18 (×2): qty 3

## 2019-07-18 MED ORDER — ONDANSETRON HCL 4 MG PO TABS: 4.0000 mg | ORAL_TABLET | Freq: Four times a day (QID) | ORAL | Status: DC | PRN

## 2019-07-18 MED ORDER — ACETAMINOPHEN 650 MG RE SUPP: 650.0000 mg | Freq: Four times a day (QID) | RECTAL | Status: DC | PRN

## 2019-07-18 MED ORDER — ISOSORBIDE MONONITRATE ER 30 MG PO TB24
30.0000 mg | ORAL_TABLET | Freq: Every day | ORAL | Status: DC
  Administered 2019-07-19 – 2019-07-20 (×2): 30 mg via ORAL
  Filled 2019-07-18 (×2): qty 1

## 2019-07-18 MED ORDER — HEPARIN SODIUM (PORCINE) 1000 UNIT/ML IJ SOLN
INTRAMUSCULAR | Status: AC
  Filled 2019-07-18: qty 1

## 2019-07-18 MED ORDER — ENOXAPARIN SODIUM 40 MG/0.4ML ~~LOC~~ SOLN
40.0000 mg | SUBCUTANEOUS | Status: DC
  Administered 2019-07-19 – 2019-07-20 (×2): 40 mg via SUBCUTANEOUS
  Filled 2019-07-18 (×3): qty 0.4

## 2019-07-18 MED ORDER — NITROGLYCERIN 1 MG/10 ML FOR IR/CATH LAB
INTRA_ARTERIAL | Status: AC
  Filled 2019-07-18: qty 10

## 2019-07-18 MED ORDER — ACETAMINOPHEN 325 MG PO TABS: 650.0000 mg | ORAL_TABLET | Freq: Four times a day (QID) | ORAL | Status: DC | PRN

## 2019-07-18 MED ORDER — VERAPAMIL HCL 2.5 MG/ML IV SOLN
INTRAVENOUS | Status: AC
  Filled 2019-07-18: qty 2

## 2019-07-18 MED ORDER — ONDANSETRON HCL 4 MG/2ML IJ SOLN: 4.0000 mg | Freq: Four times a day (QID) | INTRAMUSCULAR | Status: DC | PRN

## 2019-07-18 MED ORDER — ASPIRIN EC 325 MG PO TBEC
325.0000 mg | DELAYED_RELEASE_TABLET | Freq: Every day | ORAL | Status: DC
  Administered 2019-07-19 – 2019-07-20 (×2): 325 mg via ORAL
  Filled 2019-07-18 (×2): qty 1

## 2019-07-18 MED ORDER — ASPIRIN 81 MG PO CHEW
324.0000 mg | CHEWABLE_TABLET | Freq: Once | ORAL | Status: AC
  Administered 2019-07-18: 324 mg via ORAL
  Filled 2019-07-18: qty 4

## 2019-07-18 MED ORDER — MAGNESIUM HYDROXIDE 400 MG/5ML PO SUSP: 30.0000 mL | Freq: Every day | ORAL | Status: DC | PRN

## 2019-07-18 NOTE — ED Notes (Signed)
Asked Dr Jimmye Norman for covid swab order

## 2019-07-18 NOTE — Consult Note (Signed)
Cardiology Consultation:   Patient ID: Chris [name] MRN: [id number]; DOB: Jan 29, 1941  Admit date: 07/18/2019 Date of Consult: 07/18/2019  Primary Care Provider: Hoover Browns, MD Primary Cardiologist: Massie Maroon, MD [facility] Primary Electrophysiologist:  None    Patient Profile:   Chris [name] is a 78 y.o. 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 Mayford Knife.  History of Present Illness:   Chris [name] was in his usual state of health until approximately 6 PM.  While eating dinner with his family, he suddenly became dizzy and disoriented.  He had difficulty raising his left arm.  He then felt profoundly weak all over and was unable to stand.  He was able to be helped up by his family but could not stand on his own.  His wife also reports that Chris [name] appeared very pale.  He did not experience any chest pain, shortness of breath, or palpitations.  He was brought to the ED by his family, where previously noted slurred speech and decreased level of responsiveness had resolved.  However, Chris [name] continues to complain of blurry vision and a headache.  EKG was performed and showed inferior ST segment elevation.  At this time, Chris [name] reports feeling back to baseline.  He denies having had any chest pain or shortness of breath.  However, his wife feels as though Chris [name] has some chronic exertional dyspnea.  Over the last week, he has felt off balance.  He has not fallen.  CT of the head showed no acute intracranial abnormality.  Neurology consultation did not recommend TPA.  Repeat EKG is not diagnostic for STEMI.  Heart Pathway Score:     Past Medical History:  Diagnosis Date  . Coronary artery disease 2006   4-vessel CABG  . Hyperlipidemia   . Hypertension   . Type 2 diabetes mellitus (HCC)     Past  Surgical History:  Procedure Laterality Date  . BYPASS GRAFT  2006   4-vessel  . TOE AMPUTATION Left      Home Medications:  Prior to Admission medications   Medication Sig Start Date Lachelle Rissler Date Taking? Authorizing Provider  amitriptyline (ELAVIL) 50 MG tablet  09/10/16   [provider]  atorvastatin (LIPITOR) 40 MG tablet Take by mouth. 08/11/18   [provider]  cephALEXin (KEFLEX) 500 MG capsule Take 1 capsule (500 mg total) by mouth 3 (three) times daily. 04/26/19   Helane Gunther, DPM  Cholecalciferol (VITAMIN D3) 2000 units capsule Take by mouth.    [provider]  Cyanocobalamin (B-12) 2500 MCG TABS Take by mouth.    [provider]  DOCOSAHEXAENOIC ACID PO Take 300 mg by mouth.    [provider]  furosemide (LASIX) 40 MG tablet TAKE 1 TABLET(40 MG) BY MOUTH EVERY DAY 09/22/18   [provider]  gabapentin (NEURONTIN) 300 MG capsule TAKE 4 CAPSULES(1200 MG) BY MOUTH TWICE DAILY 09/22/18   [provider]  HUMALOG KWIKPEN 100 UNIT/ML KwikPen INJECT 10 UNITS SUBCUTANEOUSLY DAILY AT LUNCH ONLY 09/21/18   [provider]  HUMALOG MIX 75/25 KWIKPEN (75-25) 100 UNIT/ML Kwikpen  10/21/16   [provider]  insulin lispro protamine-lispro (HUMALOG 75/25 MIX) (75-25) 100 UNIT/ML SUSP injection Inject into the skin.    [provider]  isosorbide mononitrate (IMDUR) 30 MG  24 hr tablet TK 1-2 TS PO D. TK 30 MG D. CAN INCREASE TO 60 MG D IF SYMPTOMS DO NOT RESOLVE 09/21/18   [provider]  levothyroxine (SYNTHROID, LEVOTHROID) 88 MCG tablet TAKE 1 TABLET BY MOUTH DAILY AT 6 AM 10/29/18   [provider]  losartan (COZAAR) 25 MG tablet Take 25 mg by mouth.    [provider]  Multiple Vitamins-Minerals (MULTIVITAMIN WITH MINERALS) tablet Take by mouth.    [provider]  Multiple Vitamins-Minerals (OCUVITE PO) Take by mouth.    [provider]  ONE TOUCH ULTRA  TEST test strip TEST FOUR TIMES A DAY BEFORE MEALS AND NIGHTLY 08/18/18   [provider]  pentoxifylline (TRENTAL) 400 MG CR tablet TAKE 1 TABLET(400 MG) BY MOUTH TWICE DAILY 07/29/18   [provider]  pravastatin (PRAVACHOL) 80 MG tablet Take 80 mg by mouth. 09/09/16   [provider]  pyridoxine (B-6) 100 MG tablet Take 100 mg by mouth.    [provider]  silver sulfADIAZINE (SILVADENE) 1 % cream Apply 1 application topically daily. 06/26/17   Felecia ShellingEvans, Brent M, DPM  thiamine 100 MG tablet Take 100 mg by mouth.    [provider]  UNABLE TO FIND Take by mouth.    [provider]    Inpatient Medications: Scheduled Meds:  Continuous Infusions:  PRN Meds:   Allergies:    Allergies  Allergen Reactions  . No Known Allergies     Social History:   Social History   Tobacco Use  . Smoking status: Never Smoker  . Smokeless tobacco: Never Used  Substance Use Topics  . Alcohol use: Not Currently    Comment: Two drinks/year  . Drug use: Never     Family History:   Family History  Problem Relation Age of Onset  . Seizures Brother   . Diabetes Mother   . Heart disease Sister      ROS:  Please see the history of present illness. All other ROS reviewed and negative.     Physical Exam/Data:   Vitals:   07/18/19 1922 07/18/19 1924 07/18/19 1930 07/18/19 1945  BP: (!) 121/50  (!) 102/50 (!) 108/51  Pulse: 68  63 63  Resp: (!) 22  19 18   Temp:  (!) 97.5 F (36.4 C)    TempSrc:  Oral    SpO2: 99%  100% 100%  Weight:      Height:       No intake or output data in the 24 hours ending 07/18/19 2053 Last 3 Weights 07/18/2019  Weight (lbs) 247 lb  Weight (kg) 112.038 kg     Body mass index is 32.59 kg/m.  General:  Well nourished, well developed, in no acute distress.  He is accompanied by his wife. HEENT: normal Lymph: no adenopathy Neck: no JVD Endocrine:  No thryomegaly Vascular: No carotid bruits; 2+ radial pulses  bilaterally Cardiac: Regular rate and rhythm without murmurs, rubs, or gallops. Lungs:  clear to auscultation bilaterally, no wheezing, rhonchi or rales  Abd: soft, nontender, no hepatomegaly  Ext: Trace pretibial edema bilaterally. Musculoskeletal:  No deformities, BUE and BLE strength normal and equal Skin: warm and dry  Neuro: Right lateral visual [name] deficit noted.  Able to move upper and lower extremities bilaterally.  Normal fine touch sensation bilaterally. Psych:  Normal affect   EKG:  The EKG was personally reviewed and demonstrates: 19:08: Sinus rhythm with up to 1 mm ST segment elevation in  the inferior leads as well as PR depression. 19:15: Sinus rhythm with nonspecific ST changes. 20:18: Baseline artifact.  Sinus rhythm without significant abnormalities. Telemetry:  Telemetry was personally reviewed and demonstrates: Normal sinus rhythm.  Relevant CV Studies: Echocardiogram (09/13/2018, [facility]): LVEF 55-60% with grade 2 diastolic dysfunction.  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  Mild left atrial enlargement.  Normal RV function.  Pharmacologic MPI (09/08/2018, [facility]): Abnormal study with moderate in size, mild to moderate in severity, reversible defect involving the apical lateral, basal and mid anterolateral and basal and mid inferolateral segments consistent with potential ischemia but cannot rule out artifact.  LVEF 61%.  Laboratory Data:  High Sensitivity Troponin:   Recent Labs  Lab 07/18/19 1920  TROPONINIHS 10     Chemistry Recent Labs  Lab 07/18/19 1920  NA 142  K 4.3  CL 105  CO2 24  GLUCOSE 132*  BUN 27*  CREATININE 1.64*  CALCIUM 8.9  GFRNONAA 40*  GFRAA 46*  ANIONGAP 13    Recent Labs  Lab 07/18/19 1920  PROT 7.1  ALBUMIN 3.7  AST 35  ALT 43  ALKPHOS 93  BILITOT 0.6   Hematology Recent Labs  Lab 07/18/19 1920  WBC 6.7  RBC 4.21*  HGB 12.7*  HCT 38.1*  MCV 90.5  MCH 30.2  MCHC 33.3  RDW 13.0  PLT 158    BNPNo results for input(s): BNP, PROBNP in the last 168 hours.  DDimer No results for input(s): DDIMER in the last 168 hours.   Radiology/Studies:  Dg Chest 1 View  Result Date: 07/18/2019 CLINICAL DATA:  Dizziness EXAM: CHEST  1 VIEW COMPARISON:  None. FINDINGS: The heart size and mediastinal contours are within normal limits. Both lungs are clear. The visualized skeletal structures are unremarkable. IMPRESSION: No active disease. Electronically Signed   By: Deatra Robinson M.D.   On: 07/18/2019 20:15   Ct Head Wo Contrast  Result Date: 07/18/2019 CLINICAL DATA:  Altered level of consciousness (LOC), unexplained. Additional history provided: Dizziness, shortness of breath, speech difficulty, difficulty walking, blurred vision EXAM: CT HEAD WITHOUT CONTRAST TECHNIQUE: Contiguous axial images were obtained from the base of the skull through the vertex without intravenous contrast. COMPARISON:  No pertinent prior studies available for comparison. FINDINGS: Brain: No evidence of acute intracranial hemorrhage. No demarcated cortical infarction. No evidence of intracranial mass. No midline shift or extra-axial fluid collection. Minimal scattered ill-defined hypoattenuation within the cerebral white matter is nonspecific, but consistent with chronic small vessel ischemic disease. Mild generalized parenchymal atrophy. Vascular: No hyperdense vessel.  Atherosclerotic calcifications. Skull: Normal. Negative for fracture or focal lesion. Sinuses/Orbits: Visualized orbits demonstrate no acute abnormality. Minimal right sphenoid sinus mucosal thickening. No significant mastoid effusion. IMPRESSION: 1. No CT evidence of acute intracranial abnormality. 2. Mild generalized parenchymal atrophy with minimal chronic small vessel ischemic disease. Electronically Signed   By: Jackey Loge DO   On: 07/18/2019 20:10    Assessment and Plan:   Coronary artery disease: Presentation is not consistent with acute coronary  syndrome, given the lack of chest pain and shortness of breath.  Should be noted a myocardial perfusion stress test last December showed possible area of anterolateral and inferolateral ischemia versus artifact.  Initial EKG changes are nonspecific and actually more suggestive of pericarditis, though history is not in line with this.  Most recent repeat EKG does not show any significant ST segment changes.  We discussed proceeding with emergent cardiac catheterization versus continued noninvasive work-up and medical  therapy and have agreed to the latter, given that EKG is not diagnostic for STEMI and symptoms are atypical for ACS.  I recommend serial troponins and telemetry monitoring.  I think it is reasonable to defer IV heparin unless the patient were to have angina, dynamic EKG changes, or significant rise in troponin.  Repeat echocardiogram should be obtained.  Based on symptoms and work-up over the next 12 to 24 hours, we will need to consider cardiac catheterization prior to discharge versus close outpatient follow-up with his primary cardiologist at [facility].  Continue aspirin, atorvastatin, and isosorbide mononitrate for now, as blood pressure tolerates.  Dizziness and weakness: Generalized weakness but more focal weakness in the left arm noted earlier this evening.  This has resolved on its own.  CT of the head did not show any acute intracranial abnormality.  Neurology recommends work-up for possible stroke/TIA but does not feel that thrombolysis is indicated at this time.  I agree with obtaining an echocardiogram and monitoring on telemetry.  Acute kidney injury: Creatinine modestly elevated at 1.6 (most recently 1 at [facility] on 03/02/2019).  I suggest gentle hydration and avoidance of nephrotoxic drugs.  Temporary discontinuation of furosemide and losartan should be considered.  Hypertension: Pressure normal to slightly low.  As above, losartan and furosemide should be held.  Hyperlipidemia: Check  fasting lipid panel.  Continue atorvastatin 40 mg daily.  For questions or updates, please contact [place] Please consult [email or web address] for contact info under [facility].  Signed, Nelva Bush, MD  07/18/2019 8:53 PM

## 2019-07-18 NOTE — ED Notes (Signed)
Repeat EKG given to Dr Jimmye Norman

## 2019-07-18 NOTE — Consult Note (Addendum)
[facility] Consult Services   Date of Service:   07/18/2019 19:49:16  Impression:     .  Rule Out Acute Ischemic Stroke     .  vs TIA     .  vs seizures     .  vs cardiac etiology  Comments/Sign-Out: Patient with history of hypertension, Diabetes Mellitus, CABG Presents after experiencing headache, dizziness, left arm weakness and became pale and short of breath and "zoned out" had slurred speech and blurred vision. Weakness and slurred speech have resolved. Has been having similar episodes in the past. EKG shows slight ST elevation. Head CT: No acute Intracranial hemorrhage. NIHSS 0 Etiology is unclear but cannot rule out TIA or Acute Ischemic Stroke just based on clinical presentation. Seizures and cardiac etiology should be considered as well. NIHSS 0 therefore not a candidate for IV Alteplase. Symptoms not suggestive of Large Vessel Occlusion.  Mechanism of Stroke: Small Vessel Disease  Metrics: Last Known Well: 07/18/2019 18:15:00 [facility] Notification Time: 07/18/2019 19:49:16 Arrival Time: 07/18/2019 19:02:00 Stamp Time: 07/18/2019 19:49:16 Time First Login Attempt: 07/18/2019 19:56:00 Video Start Time: 07/18/2019 19:56:00  Symptoms: headache, transient left arm weakness. NIHSS Start Assessment Time: 07/18/2019 20:06:37 Patient is not a candidate for Alteplase/Activase. Patient was not deemed candidate for Alteplase/Activase thrombolytics because of Resolved symptoms (no residual disabling symptoms). Video End Time: 07/18/2019 20:14:01  CT head was reviewed.  Clinical Presentation is not Suggestive of Large Vessel Occlusive Disease  ED Physician notified of diagnostic impression and management plan on 07/18/2019 20:20:01  Our recommendations are outlined below.  Recommendations:     .  Activate Stroke Protocol Admission/Order Set     .  Stroke/Telemetry Floor     .  Neuro Checks     .  Bedside Swallow Eval     .  DVT  Prophylaxis     .  IV Fluids, Normal Saline     .  Head of Bed 30 Degrees     .  Euglycemia and Avoid Hyperthermia (PRN Acetaminophen)     .  Antiplatelet Therapy Recommended     .  Stroke work up with: Noncontrast Brain MRI, head and neck MRA (or CTA) if not done in the ER, lipid panel, HbA1c (if not done in the past 3 months), Transthoracic ECHO     .  if cardiac and above workup is inconclusive then consider routine EEG  Routine Consultation with [facility] for Follow up Care  Sign Out:     .  Discussed with Emergency Department Provider    ------------------------------------------------------------------------------  History of Present Illness: Patient is a 78 year old Male.  Patient was brought by private transportation with symptoms of headache, transient left arm weakness.  Patient with history of hypertension, Diabetes Mellitus, CABG last known well per patient and Presenting symptoms/Chief complaint/reason for consult: 18:15 Developed headache, dizziness, left arm weakness and became pale and short of breath and "zoned out" had slurred speech and blurred vision. Has been having episodes of dizziness in the past two days. He has been having episodes like this before. Anticoagulation or Antiplatelet use: aspirin Premorbid Level of function: Independent, Normal cognition, Speech and gait Chart reviewed for Pertinent medical, surgical, family history. Chart reviewed for Medications list and allergies. Pertinent positive and negative review of systems noted in History of Present Illness.   Past Medical History:     . Hypertension     . Diabetes Mellitus     . Coronary Artery Disease  Anticoagulant use:  No  Antiplatelet use: aspirin  Examination: BP(121/50), Pulse(69), Blood Glucose(132) 1A: Level of Consciousness - Alert; keenly responsive + 0 1B: Ask Month and Age - Both Questions Right + 0 1C: Blink Eyes & Squeeze Hands - Performs Both Tasks + 0 2: Test  Horizontal Extraocular Movements - Normal + 0 3: Test Visual Fields - No Visual Loss + 0 4: Test Facial Palsy (Use Grimace if Obtunded) - Normal symmetry + 0 5A: Test Left Arm Motor Drift - No Drift for 10 Seconds + 0 5B: Test Right Arm Motor Drift - No Drift for 10 Seconds + 0 6A: Test Left Leg Motor Drift - No Drift for 5 Seconds + 0 6B: Test Right Leg Motor Drift - No Drift for 5 Seconds + 0 7: Test Limb Ataxia (FNF/Heel-Shin) - No Ataxia + 0 8: Test Sensation - Normal; No sensory loss + 0 9: Test Language/Aphasia - Normal; No aphasia + 0 10: Test Dysarthria - Normal + 0 11: Test Extinction/Inattention - No abnormality + 0  NIHSS Score: 0  Patient/Family was informed the Neurology Consult would happen via TeleHealth consult by way of interactive audio and video telecommunications and consented to receiving care in this manner.   Due to the immediate potential for life-threatening deterioration due to underlying acute neurologic illness, I spent 18 minutes providing critical care. This time includes time for face to face visit via telemedicine, review of medical records, imaging studies and discussion of findings with providers, the patient and/or family.   Dr Driscilla Grammes   [facility] [phone]   Case [id number]

## 2019-07-18 NOTE — ED Triage Notes (Signed)
Provider at bedside

## 2019-07-18 NOTE — ED Provider Notes (Signed)
Patient was seen and examined by me, initial EKG interpreted by me, sinus rhythm with a rate of 65 bpm, ST elevation is noted, some T wave inversion is noted in aVL particularly  Repeat EKG interpreted by me, sinus rhythm with baseline artifact, ST elevation persist in the anterior leads, rate of 62 bpm, inferior elevation appears to be improved.  Patient symptoms are very vague, currently he was neurologically intact.  NIH stroke scale of 0 and was evaluated by neurology.  Patient was also evaluated by cardiology, we will admit and continue full cardiac work-up, he may require additional MRI at some point if no specific etiology is discovered.  He was given aspirin, we will not give heparin at this time.  CRITICAL CARE Performed by: Laurence Aly   Total critical care time: 30 minutes  Critical care time was exclusive of separately billable procedures and treating other patients.  Critical care was necessary to treat or prevent imminent or life-threatening deterioration.  Critical care was time spent personally by me on the following activities: development of treatment plan with patient and/or surrogate as well as nursing, discussions with consultants, evaluation of patient's response to treatment, examination of patient, obtaining history from patient or surrogate, ordering and performing treatments and interventions, ordering and review of laboratory studies, ordering and review of radiographic studies, pulse oximetry and re-evaluation of patient's condition.    Earleen Newport, MD 07/18/19 2038

## 2019-07-18 NOTE — ED Triage Notes (Addendum)
Pt was eating dinner with family when pt began to feel dizzy; got short of breath; wife said he looked "zoned out" and became pale; pt was speaking some but speech was slurred; currently speech is clear; having trouble walking and vision was blurred; pt says vision is still blurry; pt says he has a headache; (triage was stopped and pt was taken straight to treatment room when initial EKG read "ACUTE STEMI")

## 2019-07-18 NOTE — ED Notes (Signed)
Pt in scans. °

## 2019-07-18 NOTE — ED Provider Notes (Signed)
[facility] Emergency Department Provider Note  ____________________________________________  Time seen: Approximately 8:18 PM  I have reviewed the triage vital signs and the nursing notes.   HISTORY  Chief Complaint Altered Mental Status    HPI Chris [name] is a 78 y.o. male with past medical history CAD status post CABG 2006, possible rheumatic heart disease, diabetes type 2, hypertension, hyperlipidemia that presents emergency department for episode of feeling "woozy," blurred vision, dizziness, frontal headache, difficulty walking, and difficulty lifting his left arm to blow his nose around 6:15 PM. Patient still has a frontal headache.  Patient takes a baby aspirin daily.  No shortness of breath, chest pain, cough.   Past Medical History:  Diagnosis Date  . Coronary artery disease 2006   4-vessel CABG  . Hyperlipidemia   . Hypertension   . Type 2 diabetes mellitus [facility](HCC)     Patient Active Problem List   Diagnosis Date Noted  . Ulcer of ankle (HCC) 04/26/2019  . Pain due to onychomycosis of toenails of both feet 03/15/2019  . Dysphagia 10/29/2016  . Impaired functional mobility, balance, gait, and endurance 10/29/2016  . Memory impairment 10/29/2016  . Right hip pain 10/29/2016  . Tremors of nervous system 10/29/2016  . Carpal tunnel syndrome 08/07/2016  . Coronary artery disease involving coronary bypass graft of native heart without angina pectoris 07/18/2016  . Essential hypertension 07/18/2016  . HLD (hyperlipidemia) 07/18/2016  . Hypothyroidism (acquired) 07/18/2016  . Paresthesias 07/18/2016  . Risk for falls 07/18/2016  . Type 2 diabetes mellitus with diabetic neuropathy, without long-term current use of insulin (HCC) 07/18/2016  . Type 2 diabetes mellitus with pressure callus (HCC) 07/18/2016    Past Surgical History:  Procedure Laterality Date  . BYPASS GRAFT  2006   4-vessel  . TOE AMPUTATION Left     Prior to Admission  medications   Medication Sig Start Date End Date Taking? Authorizing Provider  amitriptyline (ELAVIL) 50 MG tablet  09/10/16   [provider]  atorvastatin (LIPITOR) 40 MG tablet Take by mouth. 08/11/18   [provider]  cephALEXin (KEFLEX) 500 MG capsule Take 1 capsule (500 mg total) by mouth 3 (three) times daily. 04/26/19   Helane GuntherMayer, Gregory, DPM  Cholecalciferol (VITAMIN D3) 2000 units capsule Take by mouth.    [provider]  Cyanocobalamin (B-12) 2500 MCG TABS Take by mouth.    [provider]  DOCOSAHEXAENOIC ACID PO Take 300 mg by mouth.    [provider]  furosemide (LASIX) 40 MG tablet TAKE 1 TABLET(40 MG) BY MOUTH EVERY DAY 09/22/18   [provider]  gabapentin (NEURONTIN) 300 MG capsule TAKE 4 CAPSULES(1200 MG) BY MOUTH TWICE DAILY 09/22/18   [provider]  HUMALOG KWIKPEN 100 UNIT/ML KwikPen INJECT 10 UNITS SUBCUTANEOUSLY DAILY AT LUNCH ONLY 09/21/18   [provider]  HUMALOG MIX 75/25 KWIKPEN (75-25) 100 UNIT/ML Kwikpen  10/21/16   [provider]  insulin lispro protamine-lispro (HUMALOG 75/25 MIX) (75-25) 100 UNIT/ML SUSP injection Inject into the skin.    [provider]  isosorbide mononitrate (IMDUR) 30 MG 24 hr tablet TK 1-2 TS PO D. TK 30 MG D. CAN INCREASE TO 60 MG D IF SYMPTOMS DO NOT RESOLVE 09/21/18   [provider]  levothyroxine (SYNTHROID, LEVOTHROID) 88 MCG tablet TAKE 1 TABLET BY MOUTH DAILY AT 6 AM 10/29/18   [provider]  losartan (COZAAR) 25 MG tablet Take 25 mg by mouth.    [provider]  Multiple Vitamins-Minerals (MULTIVITAMIN WITH MINERALS) tablet Take by mouth.    [provider]  Multiple Vitamins-Minerals (OCUVITE PO) Take by mouth.    [provider]  ONE TOUCH ULTRA TEST test strip TEST FOUR TIMES A DAY BEFORE MEALS AND NIGHTLY 08/18/18   [provider]  pentoxifylline (TRENTAL) 400 MG CR tablet TAKE 1  TABLET(400 MG) BY MOUTH TWICE DAILY 07/29/18   [provider]  pravastatin (PRAVACHOL) 80 MG tablet Take 80 mg by mouth. 09/09/16   [provider]  pyridoxine (B-6) 100 MG tablet Take 100 mg by mouth.    [provider]  silver sulfADIAZINE (SILVADENE) 1 % cream Apply 1 application topically daily. 06/26/17   Felecia Shelling, DPM  thiamine 100 MG tablet Take 100 mg by mouth.    [provider]  UNABLE TO FIND Take by mouth.    [provider]    Allergies No known allergies  Family History  Problem Relation Age of Onset  . Seizures Brother   . Diabetes Mother   . Heart disease Sister     Social History Social History   Tobacco Use  . Smoking status: Never Smoker  . Smokeless tobacco: Never Used  Substance Use Topics  . Alcohol use: Not Currently    Comment: Two drinks/year  . Drug use: Never     Review of Systems  Constitutional: No fever/chills Cardiovascular: No chest pain. Respiratory: No cough. No SOB. Gastrointestinal: No abdominal pain.  No nausea, no vomiting.  Musculoskeletal: Negative for musculoskeletal pain. Skin: Negative for rash, abrasions, lacerations, ecchymosis. Neurological: Positive for headache.   ____________________________________________   PHYSICAL EXAM:  VITAL SIGNS: ED Triage Vitals  Enc Vitals Group     BP 07/18/19 1922 (!) 121/50     Pulse Rate 07/18/19 1922 68     Resp 07/18/19 1922 (!) 22     Temp 07/18/19 1924 (!) 97.5 F (36.4 C)     Temp Source 07/18/19 1924 Oral     SpO2 07/18/19 1922 99 %     Weight 07/18/19 1917 247 lb (112 kg)     Height 07/18/19 1917 6\' 1"  (1.854 m)     Head Circumference --      Peak Flow --      Pain Score 07/18/19 1917 0     Pain Loc --      Pain Edu? --      Excl. in GC? --      Constitutional: Alert and oriented. Well appearing and in no acute distress. Eyes: Conjunctivae are normal. PERRL. EOMI. Head: Atraumatic. ENT:      Ears:      Nose: No  congestion/rhinnorhea.      Mouth/Throat: Mucous membranes are moist.  Neck: No stridor. Cardiovascular: Normal rate, regular rhythm.  Good peripheral circulation. Respiratory: Normal respiratory effort without tachypnea or retractions. Lungs CTAB. Good air entry to the bases with no decreased or absent breath sounds. Gastrointestinal: Bowel sounds 4 quadrants. Soft and nontender to palpation. No guarding or rigidity. No palpable masses. No distention.  Musculoskeletal: Full range of motion to all extremities. No gross deformities appreciated. Neurologic: Normal speech and language. No gross focal neurologic deficits are appreciated.  Cranial nerves: 2-10 normal as tested. Strength 5/5 in upper and lower extremities Cerebellar: Finger-nose-finger WNL, Heel to shin WNL Sensorimotor: No pronator drift, clonus, sensory loss or abnormal reflexes. No vision deficits noted to confrontation bilaterally.  Speech: No dysarthria or expressive aphasia  Skin:  Skin is warm, dry and intact. No rash noted. Psychiatric: Mood and affect are normal. Speech and behavior are normal. Patient exhibits appropriate insight and judgement.   ____________________________________________   LABS (all labs ordered are listed, but only abnormal results are displayed)  Labs Reviewed  CBC WITH DIFFERENTIAL/PLATELET - Abnormal; Notable for the following components:      Result Value   RBC 4.21 (*)    Hemoglobin 12.7 (*)    HCT 38.1 (*)    All other components within normal limits  COMPREHENSIVE METABOLIC PANEL - Abnormal; Notable for the following components:   Glucose, Bld 132 (*)    BUN 27 (*)    Creatinine, Ser 1.64 (*)    GFR calc non Af Amer 40 (*)    GFR calc Af Amer 46 (*)    All other components within normal limits  PROTIME-INR  APTT  CBG MONITORING, ED  TROPONIN I (HIGH SENSITIVITY)    ____________________________________________  EKG   ____________________________________________  RADIOLOGY Robinette Haines, personally viewed and evaluated these images (plain radiographs) as part of my medical decision making, as well as reviewing the written report by the radiologist.  Dg Chest 1 View  Result Date: 07/18/2019 CLINICAL DATA:  Dizziness EXAM: CHEST  1 VIEW COMPARISON:  None. FINDINGS: The heart size and mediastinal contours are within normal limits. Both lungs are clear. The visualized skeletal structures are unremarkable. IMPRESSION: No active disease. Electronically Signed   By: Ulyses Jarred M.D.   On: 07/18/2019 20:15   Ct Head Wo Contrast  Result Date: 07/18/2019 CLINICAL DATA:  Altered level of consciousness (LOC), unexplained. Additional history provided: Dizziness, shortness of breath, speech difficulty, difficulty walking, blurred vision EXAM: CT HEAD WITHOUT CONTRAST TECHNIQUE: Contiguous axial images were obtained from the base of the skull through the vertex without intravenous contrast. COMPARISON:  No pertinent prior studies available for comparison. FINDINGS: Brain: No evidence of acute intracranial hemorrhage. No demarcated cortical infarction. No evidence of intracranial mass. No midline shift or extra-axial fluid collection. Minimal scattered ill-defined hypoattenuation within the cerebral white matter is nonspecific, but consistent with chronic small vessel ischemic disease. Mild generalized parenchymal atrophy. Vascular: No hyperdense vessel.  Atherosclerotic calcifications. Skull: Normal. Negative for fracture or focal lesion. Sinuses/Orbits: Visualized orbits demonstrate no acute abnormality. Minimal right sphenoid sinus mucosal thickening. No significant mastoid effusion. IMPRESSION: 1. No CT evidence of acute intracranial abnormality. 2. Mild generalized parenchymal atrophy with minimal chronic small vessel ischemic disease. Electronically Signed   By:  Kellie Simmering DO   On: 07/18/2019 20:10    ____________________________________________    PROCEDURES  Procedure(s) performed:    Procedures    Medications  aspirin chewable tablet 324 mg (324 mg Oral Given 07/18/19 2052)  verapamil (ISOPTIN) 2.5 MG/ML injection (has no administration in time range)  heparin 1000 UNIT/ML injection (has no administration in time range)  nitroGLYCERIN 100 mcg/mL intra-arterial injection (has no administration in time range)     ____________________________________________   INITIAL IMPRESSION / ASSESSMENT AND PLAN / ED COURSE  Pertinent labs & imaging results that were available during my care of the patient were reviewed by me and considered in my medical decision making (see chart for details).  Review of the [place] CSRS was performed in accordance of the [place] prior to dispensing any controlled drugs.   Patient presented emergency department for evaluation of a short episode of leg symptoms of the, difficulty walking, headache this evening.  Initial EKG shows some ST  elevation noted by Dr. Mayford Knife.  Patient denies any shortness of breath, chest pain.  CT head, chest x-ray, labs were ordered and are pending.  Dr. Cristal Deer End was consulted and will personally evaluate the patient.  Tele neuro was consulted while awaiting CT and will stay on for further consultation.  Dr. Mayford Knife has additionally personally evaluated the patient as well.  Patient's symptoms have fully resolved.  Dr. Mayford Knife consulted with Dr. Okey Dupre, telemetry neuro and will admit patient for cardiac work-up and possible MRI.  See MD note for details.  Dahmir Epperly was evaluated in Emergency Department on 07/18/2019 for the symptoms described in the history of present illness. He was evaluated in the context of the global COVID-19 pandemic, which necessitated consideration that the patient might be at risk for infection with the SARS-CoV-2 virus that causes COVID-19. Institutional  protocols and algorithms that pertain to the evaluation of patients at risk for COVID-19 are in a state of rapid change based on information released by regulatory bodies including the CDC and federal and state organizations. These policies and algorithms were followed during the patient's care in the ED.   ____________________________________________  FINAL CLINICAL IMPRESSION(S) / ED DIAGNOSES  Final diagnoses:  Dizziness  Abnormal ECG      NEW MEDICATIONS STARTED DURING THIS VISIT:  ED Discharge Orders    None          This chart was dictated using voice recognition software/Dragon. Despite best efforts to proofread, errors can occur which can change the meaning. Any change was purely unintentional.    Enid Derry, PA-C 07/18/19 2103    Emily Filbert, MD 07/18/19 2201

## 2019-07-18 NOTE — ED Notes (Signed)
Report given to Jenna, RN

## 2019-07-19 ENCOUNTER — Other Ambulatory Visit: Payer: Medicare Other

## 2019-07-19 ENCOUNTER — Inpatient Hospital Stay: Payer: Medicare Other

## 2019-07-19 ENCOUNTER — Inpatient Hospital Stay (HOSPITAL_COMMUNITY)
Admit: 2019-07-19 | Discharge: 2019-07-19 | Disposition: A | Discharge status: Home / Self Care | Payer: Medicare Other | Attending: Family Medicine | Admitting: Family Medicine

## 2019-07-19 DIAGNOSIS — I2510 Atherosclerotic heart disease of native coronary artery without angina pectoris: Secondary | ICD-10-CM | POA: Diagnosis not present

## 2019-07-19 DIAGNOSIS — G459 Transient cerebral ischemic attack, unspecified: Secondary | ICD-10-CM

## 2019-07-19 LAB — GLUCOSE, CAPILLARY
Glucose-Capillary: 136 mg/dL — ABNORMAL HIGH (ref 70–99)
Glucose-Capillary: 142 mg/dL — ABNORMAL HIGH (ref 70–99)
Glucose-Capillary: 171 mg/dL — ABNORMAL HIGH (ref 70–99)
Glucose-Capillary: 240 mg/dL — ABNORMAL HIGH (ref 70–99)

## 2019-07-19 LAB — BASIC METABOLIC PANEL WITH GFR
Anion gap: 8 (ref 5–15)
BUN: 31 mg/dL — ABNORMAL HIGH (ref 8–23)
CO2: 27 mmol/L (ref 22–32)
Calcium: 8.7 mg/dL — ABNORMAL LOW (ref 8.9–10.3)
Chloride: 106 mmol/L (ref 98–111)
Creatinine, Ser: 1.24 mg/dL (ref 0.61–1.24)
GFR calc Af Amer: >60 mL/min
GFR calc non Af Amer: 56 mL/min — ABNORMAL LOW
Glucose, Bld: 187 mg/dL — ABNORMAL HIGH (ref 70–99)
Potassium: 4.9 mmol/L (ref 3.5–5.1)
Sodium: 141 mmol/L (ref 135–145)

## 2019-07-19 LAB — CBC
HCT: 36.6 % — ABNORMAL LOW (ref 39.0–52.0)
Hemoglobin: 12 g/dL — ABNORMAL LOW (ref 13.0–17.0)
MCH: 30.1 pg (ref 26.0–34.0)
MCHC: 32.8 g/dL (ref 30.0–36.0)
MCV: 91.7 fL (ref 80.0–100.0)
Platelets: 129 K/uL — ABNORMAL LOW (ref 150–400)
RBC: 3.99 MIL/uL — ABNORMAL LOW (ref 4.22–5.81)
RDW: 12.8 % (ref 11.5–15.5)
WBC: 5.6 K/uL (ref 4.0–10.5)
nRBC: 0 % (ref 0.0–0.2)

## 2019-07-19 LAB — ECHOCARDIOGRAM COMPLETE
Height: 73 in
Weight: 3907.2 oz

## 2019-07-19 LAB — HEMOGLOBIN A1C
Hgb A1c MFr Bld: 7.6 % — ABNORMAL HIGH (ref 4.8–5.6)
Mean Plasma Glucose: 171.42 mg/dL

## 2019-07-19 LAB — TROPONIN I (HIGH SENSITIVITY): Troponin I (High Sensitivity): 9 ng/L

## 2019-07-19 LAB — SARS CORONAVIRUS 2 (TAT 6-24 HRS): SARS Coronavirus 2: NEGATIVE

## 2019-07-19 MED ORDER — ATORVASTATIN CALCIUM 20 MG PO TABS
40.0000 mg | ORAL_TABLET | Freq: Every day | ORAL | Status: DC
  Administered 2019-07-19: 40 mg via ORAL
  Filled 2019-07-19: qty 2

## 2019-07-19 MED ORDER — SODIUM CHLORIDE 0.9 % IV SOLN
INTRAVENOUS | Status: AC
  Administered 2019-07-19 – 2019-07-20 (×2): via INTRAVENOUS

## 2019-07-19 MED ORDER — LOSARTAN POTASSIUM 25 MG PO TABS
25.0000 mg | ORAL_TABLET | Freq: Every day | ORAL | Status: DC
  Administered 2019-07-19 – 2019-07-20 (×2): 25 mg via ORAL
  Filled 2019-07-19 (×2): qty 1

## 2019-07-19 MED ORDER — INSULIN ASPART 100 UNIT/ML ~~LOC~~ SOLN
0.0000 [IU] | Freq: Three times a day (TID) | SUBCUTANEOUS | Status: DC
  Administered 2019-07-19: 2 [IU] via SUBCUTANEOUS
  Administered 2019-07-19: 3 [IU] via SUBCUTANEOUS
  Administered 2019-07-19: 2 [IU] via SUBCUTANEOUS
  Administered 2019-07-20 (×2): 5 [IU] via SUBCUTANEOUS
  Filled 2019-07-19 (×5): qty 1

## 2019-07-19 NOTE — Evaluation (Signed)
Physical Therapy Evaluation Patient Details Name: Chris [name] MRN: [id number] DOB: 01-15-1941 Today's Date: 07/19/2019   History of Present Illness  Pt is a 78 y/o M admitted for possible CVA. MRI was clear and cardiology is to perform additional testin 10/26. Pt had c/o dizziness, SOB, slurring, HA, blurry vision. PMH includes HTN, DM, CAD, HLD.   Clinical Impression  Pt is a pleasant 78 year old M who was admitted for CVA. Upon entry, pt is resting in bed. He states he has back pain 6/10 at rest that increases 7-8/10 when sitting up or amb. Pt performs bed mobility, transfers, and ambulation with supervision/min guard provided by PT. Pt demonstrates kyphotic posture, which he reports is due to his back pain. Vitals monitored throughout evaluaion, HR remained WNL, but SpO2 drops with exertion to 87-89%. Pt demonstrates deficits with activity tolerance. Pt will benefit from skilled PT to address above deficits; current follow-up care recommendation is HHPT.      Follow Up Recommendations Home health PT    Equipment Recommendations  None recommended by PT    Recommendations for Other Services       Precautions / Restrictions Precautions Precautions: Fall Restrictions Weight Bearing Restrictions: No      Mobility  Bed Mobility Overal bed mobility: Needs Assistance Bed Mobility: Supine to Sit     Supine to sit: Supervision     General bed mobility comments: Pt able to perform bed mobility with PT supervision. Increased time required to reach EOB 2/2 back pain. Use of bedrails  Transfers Overall transfer level: Needs assistance Equipment used: Rolling walker (2 wheeled) Transfers: Sit to/from Stand Sit to Stand: Min guard         General transfer comment: Pt requires significant UE support from RW. It takes 3 quick stand attempts to actually reach stand. Pt does not stand with uprught posture  Ambulation/Gait Ambulation/Gait assistance: Min guard Gait Distance  (Feet): 200 Feet Assistive device: Rolling walker (2 wheeled) Gait Pattern/deviations: Step-through pattern;Trunk flexed     General Gait Details: Pt amb with reciprocal gait pattern, but displays marked trunk flexion 2/2 back issues  Stairs            Wheelchair Mobility    Modified Rankin (Stroke Patients Only)       Balance Overall balance assessment: Needs assistance Sitting-balance support: No upper extremity supported Sitting balance-Leahy Scale: Good Sitting balance - Comments: Able to attain upright sitting, increased time to get to seated position from supine.   Standing balance support: Bilateral upper extremity supported Standing balance-Leahy Scale: Good Standing balance comment: Pt stands in forward flexed posture, unable to correct with cues                             Pertinent Vitals/Pain Pain Assessment: 0-10 Pain Score: 7  Pain Location: back Pain Descriptors / Indicators: Aching Pain Intervention(s): Monitored during session;Limited activity within patient's tolerance;Repositioned    Home Living Family/patient expects to be discharged to:: Private residence Living Arrangements: Spouse/significant other Available Help at Discharge: Family Type of Home: House Home Access: Level entry     Home Layout: One level Home Equipment: Cane - single point;Bedside commode;Walker - 2 wheels;Walker - 4 wheels Additional Comments: Pt amb using SPC     Prior Function Level of Independence: Independent with assistive device(s)         Comments: Pt amb community distances with SPC and reports to be independent in ADL's  Hand Dominance        Extremity/Trunk Assessment   Upper Extremity Assessment Upper Extremity Assessment: Overall WFL for tasks assessed    Lower Extremity Assessment Lower Extremity Assessment: Overall WFL for tasks assessed    Cervical / Trunk Assessment Cervical / Trunk Assessment: Kyphotic  Communication    Communication: No difficulties  Cognition Arousal/Alertness: Awake/alert Behavior During Therapy: WFL for tasks assessed/performed Overall Cognitive Status: Within Functional Limits for tasks assessed                                        General Comments General comments (skin integrity, edema, etc.): HR remained WNL throughout tx. Pt's O2 drops to high 80's when performing exercises. He states that there is increased work of breathing when amb, but doesn't seem aware of the drop. Bilateral UE and LE coordination and vision in tact     Exercises Other Exercises Other Exercises: Supine, 10 reps, bilateral: AP, SLR, QS; PT supervision Other Exercises: Seated EOB, 10 reps, bilateral: LAQ, seated march; PT supervision   Assessment/Plan    PT Assessment Patient needs continued PT services  PT Problem List Decreased activity tolerance;Decreased balance;Decreased mobility;Cardiopulmonary status limiting activity       PT Treatment Interventions Gait training;Stair training;Functional mobility training;DME instruction;Therapeutic activities;Therapeutic exercise;Balance training;Patient/family education    PT Goals (Current goals can be found in the Care Plan section)  Acute Rehab PT Goals Patient Stated Goal: to return home PT Goal Formulation: With patient Time For Goal Achievement: 08/02/19 Potential to Achieve Goals: Good    Frequency Min 2X/week   Barriers to discharge        Co-evaluation               AM-PAC PT "6 Clicks" Mobility  Outcome Measure Help needed turning from your back to your side while in a flat bed without using bedrails?: None Help needed moving from lying on your back to sitting on the side of a flat bed without using bedrails?: A Little Help needed moving to and from a bed to a chair (including a wheelchair)?: A Little Help needed standing up from a chair using your arms (e.g., wheelchair or bedside chair)?: A Little Help needed to  walk in hospital room?: A Little Help needed climbing 3-5 steps with a railing? : A Little 6 Click Score: 19    End of Session Equipment Utilized During Treatment: Gait belt Activity Tolerance: Patient tolerated treatment well Patient left: in chair;with call bell/phone within reach(RN notified no box for chair alarm) Nurse Communication: Mobility status PT Visit Diagnosis: Unsteadiness on feet (R26.81);Pain Pain - part of body: (Back)    Time: 5573-2202 PT Time Calculation (min) (ACUTE ONLY): 41 min   Charges:              Dixie Dials, SPT   Raychell Holcomb 07/19/2019, 11:41 AM

## 2019-07-19 NOTE — Procedures (Signed)
ELECTROENCEPHALOGRAM REPORT   Patient: Chris [name]       Room #: 921J-HE EEG No. ID: [id number] Age: 78 y.o.        Sex: male Referring Physician: Gouru Report Date:  07/19/2019        Interpreting Physician: Alexis Goodell  History: Chris [name] is an 78 y.o. male with episode of dizziness and shaking  Medications:  ASA, Lipitor, Insulin, Imdur, Synthroid, Cozaar, Trental, B12  Conditions of Recording:  This is a 21 channel routine scalp EEG performed with bipolar and monopolar montages arranged in accordance to the international 10/20 system of electrode placement. One channel was dedicated to EKG recording.  The patient is in the awake, drowsy and asleep states.  Description:  The waking background activity consists of a low voltage, symmetrical, fairly well organized, 8 Hz alpha activity, seen from the parieto-occipital and posterior temporal regions.  Low voltage fast activity, poorly organized, is seen anteriorly and is at times superimposed on more posterior regions.  A mixture of theta and alpha rhythms are seen from the central and temporal regions. The patient drowses with slowing to irregular, low voltage theta and beta activity.   The patient goes in to a light sleep with symmetrical sleep spindles, vertex central sharp transients and irregular slow activity.   No epileptiform activity is noted.   Hyperventilation was not performed. Intermittent photic stimulation was performed but failed to illicit any change in the tracing.    IMPRESSION: Normal electroencephalogram, awake, asleep and with activation procedures. There are no focal lateralizing or epileptiform features.   Alexis Goodell, MD Neurology [phone] 07/19/2019, 4:23 PM

## 2019-07-19 NOTE — Progress Notes (Addendum)
Progress Note  Patient Name: Chris [name] Date of Encounter: 07/19/2019  Primary Cardiologist: Massie Maroonhadd Lee, MD [facility]  Subjective   Patient states doing okay, just came back from having a carotid ultrasound.  Denies chest pain or shortness of breath.  Inpatient Medications    Scheduled Meds:  aspirin EC  325 mg Oral Daily   atorvastatin  40 mg Oral q1800   enoxaparin (LOVENOX) injection  40 mg Subcutaneous Q24H   insulin aspart  0-15 Units Subcutaneous TID WC   isosorbide mononitrate  30 mg Oral Daily   levothyroxine  88 mcg Oral Q0600   pentoxifylline  400 mg Oral BID   vitamin B-12  2,500 mcg Oral Daily   Continuous Infusions:  sodium chloride 100 mL/hr at 07/19/19 0308   PRN Meds: acetaminophen **OR** acetaminophen, magnesium hydroxide, ondansetron **OR** ondansetron (ZOFRAN) IV, traZODone   Vital Signs    Vitals:   07/19/19 0115 07/19/19 0207 07/19/19 0501 07/19/19 0730  BP: (!) 149/66 (!) 172/74 (!) 167/65 (!) 157/63  Pulse: (!) 49 (!) 54 (!) 51 (!) 55  Resp:  18 16 16   Temp:  97.6 F (36.4 C) (!) 97.5 F (36.4 C) 98.1 F (36.7 C)  TempSrc:  Oral Oral Oral  SpO2: 99% 100% 98% 98%  Weight:  110.8 kg    Height:       No intake or output data in the 24 hours ending 07/19/19 1124 Last 3 Weights 07/19/2019 07/18/2019  Weight (lbs) 244 lb 3.2 oz 247 lb  Weight (kg) 110.768 kg 112.038 kg      Telemetry    Sinus rhythm- Personally Reviewed  ECG    Not obtained today  Physical Exam   GEN: No acute distress.   Neck: No JVD Cardiac: RRR, no murmurs, rubs, or gallops.  Respiratory: Clear to auscultation bilaterally. GI: Soft, nontender, non-distended  MS: No edema; No deformity. Neuro:  Nonfocal  Psych: Normal affect   Labs    High Sensitivity Troponin:   Recent Labs  Lab 07/18/19 1920 07/19/19 0509  TROPONINIHS 10 9      Chemistry Recent Labs  Lab 07/18/19 1920 07/19/19 0509  NA 142 141  K 4.3 4.9  CL 105 106  CO2 24 27    GLUCOSE 132* 187*  BUN 27* 31*  CREATININE 1.64* 1.24  CALCIUM 8.9 8.7*  PROT 7.1  --   ALBUMIN 3.7  --   AST 35  --   ALT 43  --   ALKPHOS 93  --   BILITOT 0.6  --   GFRNONAA 40* 56*  GFRAA 46* >60  ANIONGAP 13 8     Hematology Recent Labs  Lab 07/18/19 1920 07/19/19 0509  WBC 6.7 5.6  RBC 4.21* 3.99*  HGB 12.7* 12.0*  HCT 38.1* 36.6*  MCV 90.5 91.7  MCH 30.2 30.1  MCHC 33.3 32.8  RDW 13.0 12.8  PLT 158 129*    BNPNo results for input(s): BNP, PROBNP in the last 168 hours.   DDimer No results for input(s): DDIMER in the last 168 hours.   Radiology    Dg Chest 1 View  Result Date: 07/18/2019 CLINICAL DATA:  Dizziness EXAM: CHEST  1 VIEW COMPARISON:  None. FINDINGS: The heart size and mediastinal contours are within normal limits. Both lungs are clear. The visualized skeletal structures are unremarkable. IMPRESSION: No active disease. Electronically Signed   By: Deatra RobinsonKevin  Herman M.D.   On: 07/18/2019 20:15   Ct Head Wo Contrast  Result Date: 07/18/2019 CLINICAL DATA:  Altered level of consciousness (LOC), unexplained. Additional history provided: Dizziness, shortness of breath, speech difficulty, difficulty walking, blurred vision EXAM: CT HEAD WITHOUT CONTRAST TECHNIQUE: Contiguous axial images were obtained from the base of the skull through the vertex without intravenous contrast. COMPARISON:  No pertinent prior studies available for comparison. FINDINGS: Brain: No evidence of acute intracranial hemorrhage. No demarcated cortical infarction. No evidence of intracranial mass. No midline shift or extra-axial fluid collection. Minimal scattered ill-defined hypoattenuation within the cerebral white matter is nonspecific, but consistent with chronic small vessel ischemic disease. Mild generalized parenchymal atrophy. Vascular: No hyperdense vessel.  Atherosclerotic calcifications. Skull: Normal. Negative for fracture or focal lesion. Sinuses/Orbits: Visualized orbits  demonstrate no acute abnormality. Minimal right sphenoid sinus mucosal thickening. No significant mastoid effusion. IMPRESSION: 1. No CT evidence of acute intracranial abnormality. 2. Mild generalized parenchymal atrophy with minimal chronic small vessel ischemic disease. Electronically Signed   By: Kellie Simmering DO   On: 07/18/2019 20:10   Mr Brain Wo Contrast  Result Date: 07/19/2019 CLINICAL DATA:  Stroke follow-up. Encephalopathy. EXAM: MRI HEAD WITHOUT CONTRAST TECHNIQUE: Multiplanar, multiecho pulse sequences of the brain and surrounding structures were obtained without intravenous contrast. COMPARISON:  Head CT 07/18/2019 FINDINGS: BRAIN: There is no acute infarct, acute hemorrhage or extra-axial collection. Multifocal white matter hyperintensity, most commonly due to chronic ischemic microangiopathy. There is generalized atrophy without lobar predilection. The midline structures are normal. 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 VASCULAR: The major intracranial arterial and venous sinus flow voids are normal. Susceptibility-sensitive sequences show no chronic microhemorrhage or superficial siderosis. SKULL AND UPPER CERVICAL SPINE: Calvarial bone marrow signal is normal. There is no skull base mass. The visualized upper cervical spine and soft tissues are normal. SINUSES/ORBITS: There are no fluid levels or advanced mucosal thickening. The mastoid air cells and middle ear cavities are free of fluid. The orbits are normal. IMPRESSION: 1. No acute intracranial abnormality. 2. 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 Electronically Signed   By: Ulyses Jarred M.D.   On: 07/19/2019 01:51   US Carotid Bilateral  Result Date: 07/19/2019 CLINICAL DATA:  78 year old male with symptoms of transient ischemic attack EXAM: BILATERAL CAROTID DUPLEX ULTRASOUND TECHNIQUE: Pearline Cables scale imaging, color Doppler and duplex ultrasound were  performed of bilateral carotid and vertebral arteries in the neck. COMPARISON:  None. FINDINGS: Criteria: Quantification of carotid stenosis is based on velocity parameters that correlate the residual internal carotid diameter with NASCET-based stenosis levels, using the diameter of the distal internal carotid lumen as the denominator for stenosis measurement. The following velocity measurements were obtained: RIGHT ICA: 92/13 cm/sec CCA: 46/2 cm/sec SYSTOLIC ICA/CCA RATIO:  1.3 ECA:  95 cm/sec LEFT ICA: 94/18 cm/sec CCA: 70/3 cm/sec SYSTOLIC ICA/CCA RATIO:  1.0 ECA:  113 cm/sec RIGHT CAROTID ARTERY: Mild focal heterogeneous atherosclerotic plaque in the proximal internal carotid artery. By peak systolic velocity criteria, the estimated stenosis is less than 50%. RIGHT VERTEBRAL ARTERY:  Patent with normal antegrade flow. LEFT CAROTID ARTERY: Mild focal heterogeneous atherosclerotic plaque in the proximal internal carotid artery. By peak systolic velocity criteria, the estimated stenosis is less than 50%. LEFT VERTEBRAL ARTERY:  Patent with normal antegrade flow. IMPRESSION: 1. 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 2. Mild (1-49%) stenosis proximal left internal carotid artery secondary to heterogenous atherosclerotic plaque. 3. Vertebral arteries are patent with normal antegrade flow. Signed, Myrle Sheng  K. Archer Asa, MD, RPVI [facility] Radiology Electronically Signed   By: Malachy Moan M.D.   On: 07/19/2019 09:48    Cardiac Studies   Echocardiogram pending  Patient Profile     78 y.o. male with history of coronary artery disease status post CABG x4 in 2006, hypertension, hyperlipidemia, diabetes who presents due to weakness and dizziness, being seen for an abnormal ECG.  Assessment & Plan    Patient denies any symptoms of chest pain, shortness of breath, palpitations.  His  admitting symptoms do not suggest a cardiac etiology.  High-sensitivity troponins have been normal which speaks against ACS.  His EKG showed nonspecific changes.  1.  Coronary artery disease status post CABG x4 Continue aspirin, Lipitor, Imdur. Will review echocardiogram. If echocardiogram is normal, we will recommend outpatient follow-up and management of chronic coronary artery disease.  2.  Hypertension -Restart home dose losartan 25 mg daily.  3.  Dizziness and weakness -Management as per primary team.      Signed, Debbe Odea, MD  07/19/2019, 11:24 AM    Addendum Echo shows normal EF with pseudonormal patterndiastolic function. No further cardiac testing planned on this admission. Please let us know if further cardiac input is needed. Thank you

## 2019-07-19 NOTE — H&P (Signed)
[facility] - [place] at [facility]   PATIENT NAME: Chris [name]    MR#:  [id number]  DATE OF BIRTH:  25--1942  DATE OF ADMISSION:  07/18/2019  PRIMARY CARE PHYSICIAN: Hoover Browns, MD   REQUESTING/REFERRING PHYSICIAN: Daryel November, MD  CHIEF COMPLAINT:   Chief Complaint  Patient presents with   Altered Mental Status    HISTORY OF PRESENT ILLNESS:  Chris [name]  is a 78 y.o. Caucasian male with a known history of diabetes mellitus, hypertension, sleep edema in coronary artery disease, presented to the emergency room with acute onset of dizziness, feeling woozy, blurred vision and frontal headache with generalized weakness making him unable to walk or lift his left arm, which started at 6:15 PM.  He denied any paresthesias.  No chest pain or palpitations.  No nausea or vomiting.  No tinnitus or vertigo.  He was noted to be shaking in both upper and lower extremities without losing consciousness.  He denied any slurred speech or expressive dysphasia.  No cough or wheezing.  No sick exposures.  Upon presentation to the emergency room, blood pressure was 121/50 with respiratory to 22 and otherwise normal vital signs.  Labs revealed BUN of 27 and creatinine 1.64 and anemia.  COVID-19 test is pending.[name]  Head CT scan without contrast came back with no acute intracranial normalities.  Portable chest x-ray showed no acute cardiopulmonary disease.  EKG showed normal sinus rhythm with a rate of 73 with inferior about 1 mm ST segment elevation with some J-point elevation.  Repeat EKG showed normal sinus rhythm with a rate of 65 with half a millimeter inferior ST segment elevation and a repeat 1 showed normal sinus rhythm with a rate of 62 with normal ST segments.  The patient was seen by Dr. Okey Dupre who did not think that he had a STEMI.  The patient was given 4 baby aspirin.  He will be admitted to a telemetry bed for further evaluation and management.  PAST MEDICAL  HISTORY:   Past Medical History:  Diagnosis Date   Coronary artery disease 2006   4-vessel CABG   Hyperlipidemia    Hypertension    Type 2 diabetes mellitus (HCC)   Hypothyroidism  PAST SURGICAL HISTORY:   Past Surgical History:  Procedure Laterality Date   BYPASS GRAFT  2006   4-vessel   TOE AMPUTATION Left     SOCIAL HISTORY:   Social History   Tobacco Use   Smoking status: Never Smoker   Smokeless tobacco: Never Used  Substance Use Topics   Alcohol use: Not Currently    Comment: Two drinks/year    FAMILY HISTORY:   Family History  Problem Relation Age of Onset   Seizures Brother    Diabetes Mother    Heart disease Sister     DRUG ALLERGIES:   Allergies  Allergen Reactions   No Known Allergies     REVIEW OF SYSTEMS:   ROS As per history of present illness. All pertinent systems were reviewed above. Constitutional,  HEENT, cardiovascular, respiratory, GI, GU, musculoskeletal, neuro, psychiatric, endocrine,  integumentary and hematologic systems were reviewed and are otherwise  negative/unremarkable except for positive findings mentioned above in the HPI.   MEDICATIONS AT HOME:   Prior to Admission medications   Medication Sig Start Date End Date Taking? Authorizing Provider  amitriptyline (ELAVIL) 50 MG tablet Take 50 mg by mouth at bedtime.  09/10/16   [provider]  atorvastatin (LIPITOR) 40 MG  tablet Take 40 mg by mouth daily at 6 PM.  08/11/18   [provider]  Cholecalciferol (VITAMIN D3) 2000 units capsule Take 2,000 Units by mouth daily.     [provider]  Cyanocobalamin (B-12) 2500 MCG TABS Take 1 tablet by mouth daily.     [provider]  DOCOSAHEXAENOIC ACID PO Take 300 mg by mouth daily.     [provider]  furosemide (LASIX) 40 MG tablet TAKE 1 TABLET(40 MG) BY MOUTH EVERY DAY 09/22/18   [provider]  gabapentin (NEURONTIN) 300 MG capsule TAKE 4 CAPSULES(1200  MG) BY MOUTH TWICE DAILY 09/22/18   [provider]  HUMALOG KWIKPEN 100 UNIT/ML KwikPen INJECT 10 UNITS SUBCUTANEOUSLY DAILY AT LUNCH ONLY 09/21/18   [provider]  HUMALOG MIX 75/25 KWIKPEN (75-25) 100 UNIT/ML Kwikpen  10/21/16   [provider]  insulin lispro protamine-lispro (HUMALOG 75/25 MIX) (75-25) 100 UNIT/ML SUSP injection Inject into the skin.    [provider]  isosorbide mononitrate (IMDUR) 30 MG 24 hr tablet TK 1-2 TS PO D. TK 30 MG D. CAN INCREASE TO 60 MG D IF SYMPTOMS DO NOT RESOLVE 09/21/18   [provider]  levothyroxine (SYNTHROID, LEVOTHROID) 88 MCG tablet TAKE 1 TABLET BY MOUTH DAILY AT 6 AM 10/29/18   [provider]  losartan (COZAAR) 25 MG tablet Take 25 mg by mouth daily.     [provider]  Multiple Vitamins-Minerals (MULTIVITAMIN WITH MINERALS) tablet Take 1 tablet by mouth daily.     [provider]  Multiple Vitamins-Minerals (OCUVITE PO) Take 1 tablet by mouth daily.     [provider]  ONE TOUCH ULTRA TEST test strip TEST FOUR TIMES A DAY BEFORE MEALS AND NIGHTLY 08/18/18   [provider]  pentoxifylline (TRENTAL) 400 MG CR tablet TAKE 1 TABLET(400 MG) BY MOUTH TWICE DAILY 07/29/18   [provider]  pravastatin (PRAVACHOL) 80 MG tablet Take 80 mg by mouth. 09/09/16   [provider]  pyridoxine (B-6) 100 MG tablet Take 100 mg by mouth.    [provider]  thiamine 100 MG tablet Take 100 mg by mouth.    [provider]  UNABLE TO FIND Take by mouth.    [provider]      VITAL SIGNS:  Blood pressure (!) 149/66, pulse (!) 49, temperature (!) 97.5 F (36.4 C), temperature source Oral, resp. rate 14, height 6\' 1"  (1.854 m), weight 112 kg, SpO2 99 %.  PHYSICAL EXAMINATION:  Physical Exam  GENERAL:  78 y.o.-year-old Caucasian male patient lying in the bed with no acute distress.  EYES: Pupils equal, round, reactive to light  and accommodation. No scleral icterus. Extraocular muscles intact.  HEENT: Head atraumatic, normocephalic. Oropharynx and nasopharynx clear.  NECK:  Supple, no jugular venous distention. No thyroid enlargement, no tenderness.  LUNGS: Normal breath sounds bilaterally, no wheezing, rales,rhonchi or crepitation. No use of accessory muscles of respiration.  CARDIOVASCULAR: Regular rate and rhythm, S1, S2 normal. No murmurs, rubs, or gallops.  ABDOMEN: Soft, nondistended, nontender. Bowel sounds present. No organomegaly or mass.  EXTREMITIES: No pedal edema, cyanosis, or clubbing.  NEUROLOGIC: Cranial nerves II through XII are intact. Muscle strength 5/5 in all extremities except the left upper extremity where his hand grip muscle strength was 4/5.  Light touch sensation intact. Gait not checked.  PSYCHIATRIC: The patient is alert and oriented x 3.  Normal affect and good eye contact. SKIN: No obvious rash,  lesion, or ulcer.   LABORATORY PANEL:   CBC Recent Labs  Lab 07/18/19 1920  WBC 6.7  HGB 12.7*  HCT 38.1*  PLT 158   ------------------------------------------------------------------------------------------------------------------  Chemistries  Recent Labs  Lab 07/18/19 1920  NA 142  K 4.3  CL 105  CO2 24  GLUCOSE 132*  BUN 27*  CREATININE 1.64*  CALCIUM 8.9  AST 35  ALT 43  ALKPHOS 93  BILITOT 0.6   ------------------------------------------------------------------------------------------------------------------  Cardiac Enzymes No results for input(s): TROPONINI in the last 168 hours. ------------------------------------------------------------------------------------------------------------------  RADIOLOGY:  Dg Chest 1 View  Result Date: 07/18/2019 CLINICAL DATA:  Dizziness EXAM: CHEST  1 VIEW COMPARISON:  None. FINDINGS: The heart size and mediastinal contours are within normal limits. Both lungs are clear. The visualized skeletal structures are unremarkable.  IMPRESSION: No active disease. Electronically Signed   By: Deatra Robinson M.D.   On: 07/18/2019 20:15   Ct Head Wo Contrast  Result Date: 07/18/2019 CLINICAL DATA:  Altered level of consciousness (LOC), unexplained. Additional history provided: Dizziness, shortness of breath, speech difficulty, difficulty walking, blurred vision EXAM: CT HEAD WITHOUT CONTRAST TECHNIQUE: Contiguous axial images were obtained from the base of the skull through the vertex without intravenous contrast. COMPARISON:  No pertinent prior studies available for comparison. FINDINGS: Brain: No evidence of acute intracranial hemorrhage. No demarcated cortical infarction. No evidence of intracranial mass. No midline shift or extra-axial fluid collection. Minimal scattered ill-defined hypoattenuation within the cerebral white matter is nonspecific, but consistent with chronic small vessel ischemic disease. Mild generalized parenchymal atrophy. Vascular: No hyperdense vessel.  Atherosclerotic calcifications. Skull: Normal. Negative for fracture or focal lesion. Sinuses/Orbits: Visualized orbits demonstrate no acute abnormality. Minimal right sphenoid sinus mucosal thickening. No significant mastoid effusion. IMPRESSION: 1. No CT evidence of acute intracranial abnormality. 2. Mild generalized parenchymal atrophy with minimal chronic small vessel ischemic disease. Electronically Signed   By: Jackey Loge DO   On: 07/18/2019 20:10   Mr Brain Wo Contrast  Result Date: 07/19/2019 CLINICAL DATA:  Stroke follow-up. Encephalopathy. EXAM: MRI HEAD WITHOUT CONTRAST TECHNIQUE: Multiplanar, multiecho pulse sequences of the brain and surrounding structures were obtained without intravenous contrast. COMPARISON:  Head CT 07/18/2019 FINDINGS: BRAIN: There is no acute infarct, acute hemorrhage or extra-axial collection. Multifocal white matter hyperintensity, most commonly due to chronic ischemic microangiopathy. There is generalized atrophy without  lobar predilection. The midline structures are normal. 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 VASCULAR: The major intracranial arterial and venous sinus flow voids are normal. Susceptibility-sensitive sequences show no chronic microhemorrhage or superficial siderosis. SKULL AND UPPER CERVICAL SPINE: Calvarial bone marrow signal is normal. There is no skull base mass. The visualized upper cervical spine and soft tissues are normal. SINUSES/ORBITS: There are no fluid levels or advanced mucosal thickening. The mastoid air cells and middle ear cavities are free of fluid. The orbits are normal. IMPRESSION: 1. No acute intracranial abnormality. 2. 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 Electronically Signed   By: Deatra Robinson M.D.   On: 07/19/2019 01:51      IMPRESSION AND PLAN:   1.  Generalized weakness with mild left upper extremity focal weakness, dizziness and headache, concerning for TIA, rule out CVA.  The patient will be admitted to a medical monitored bed.  We will follow neuro checks every 4 hours for 24 hours.  We will continue him on aspirin and check fasting lipids in a.m.  He will be continued  on statin therapy.  We will obtain bilateral carotid Doppler and 2D echo with bubble study for further assessment.  MR MRI without contrast will be obtained.  PT and ST consults will be obtained.  Will obtain a neurology consultation with Dr. Thad Rangereynolds.  I notified her regarding the patient.  2.  Abnormal EKG.  Dr. Serita KyleEnd's assessment and recommendation are appreciated.  We will continue aspirin and statin therapy.  We will continue monitoring.  3.  Acute kidney injury.  This could be prerenal.  The patient will be hydrated with IV normal saline.  We will hold Cozaar and diuretic therapy.  4.  Type 2 diabetes mellitus.  He will be placed on subcu supplemental coverage with NovoLog and will continue basal coverage.  5.   Hypothyroidism.  We will continue Synthroid and check TSH level.  6.  Dyslipidemia.  Statin therapy will be resumed  7.  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  We will continue aspirin, Imdur and statin therapy.  8.  DVT prophylaxis.  Subtenons Lovenox   All the records are reviewed and case discussed with ED provider. The plan of care was discussed in details with the patient (and family). I answered all questions. The patient agreed to proceed with the above mentioned plan. Further management will depend upon hospital course.   CODE STATUS: Full code  TOTAL TIME TAKING CARE OF THIS PATIENT: Approximately 50 minutes.    Hannah BeatJan A Sofi Bryars M.D on 07/19/2019 at 1:59 AM  Pager - [phone]  After 6pm go to [email or web address] - Social research officer, governmentpassword [email or web address]  [facility]  Office  [phone]  CC: Primary care physician; Hoover BrownsPatel, Bansari Gautam, MD   Note: This dictation was prepared with Dragon dictation along with smaller phrase technology. Any transcriptional errors that result from this process are unintentional.

## 2019-07-19 NOTE — Evaluation (Addendum)
Clinical/Bedside Swallow Evaluation Patient Details  Name: Rochelle Nephew MRN: [id number] Date of Birth: 1941/03/03  Today's Date: 07/19/2019 Time: SLP Start Time (ACUTE ONLY): 1000 SLP Stop Time (ACUTE ONLY): 1045(then at 1245-1300 during meal) SLP Time Calculation (min) (ACUTE ONLY): 45 min  Past Medical History:  Past Medical History:  Diagnosis Date  . Coronary artery disease 2006   4-vessel CABG  . Hyperlipidemia   . Hypertension   . Type 2 diabetes mellitus ([place])    Past Surgical History:  Past Surgical History:  Procedure Laterality Date  . BYPASS GRAFT  2006   4-vessel  . TOE AMPUTATION Left    HPI:  Patient is a 78 y/o male with history of mulitple medical issues including recent heel/ankle wound w/ care at the [facility], hypertension, Diabetes Mellitus, HLD, CAD w/ CABG (4 vessel) in 2006 who presents after experiencing headache, dizziness, left arm weakness and became pale and short of breath and "zoned out" had slurred speech and blurred vision during a meal w/ family.  Weakness and slurred speech have resolved.  He has been having similar episodes in the past.  EKG shows slight ST elevation.  Head CT: No acute Intracranial hemorrhage.  NIHSS 0.  Etiology is unclear but cannot rule out TIA or Acute Ischemic Stroke just based on clinical presentation.  Per chart notation and pt's report, he had c/o dysphagia in 2018 -- pt revealed he had those "swallow tests where they lie you down on your side on the table and you drink the white liquid" -- this is describing a GI Barium study for Esophageal phase dysmotility. Pt stated "they did not find anything then".  He endorses "rice and meat getting caught on almost a ledge" pointing to his sternal notch area to mid-sternum area.  He denied any overt coughing or choking w/ oral intake since this admission.  Noted CXR: "Both lungs are clear; no active disease".  MRI: "No acute intracranial abnormality; Generalized atrophy and chronic ischemic  microangiopathy". Pt alert and verbally conversive w/ this SLP; speech clear and 100% intelligible.    Assessment / Plan / Recommendation Clinical Impression  Pt appears to present w/ adequate oropharyngeal phase swallowing function w/ no overt, clinical s/s of aspiration or oropharyngeal phase dysphagia/deficits. Pt does have a baseline of c/o (and report by pt of) "2" GI Barium swallow studies for Esophageal phase dysmotility issues ~2 years ago. He denied any current s/s of aspiration but describes difficulty swallowing such foods as meats, rice pointing to her sternal notch-mid sternum areas -- he described a "ledge" there that bothers him when eating certain foods at home. Pt is at decreased risk for aspiration when following general aspiration precautions, However, could be at risk for aspiration of REFLUX material d/t Esophageal dysmotility. During po trials, pt consumed trials of thin liquids then solid foods of Lunch meal. No overt, clinical s/s of aspiration noted; no decline in respiratory effort or vocal quality during/post trials. During the oral phase, pt consumed thin liquids and cooked foods w/ no deficits in oral phase management. Timely mastication effort and A-P transfer time w/ the trials was noted. Belching noted x1 post thin liquids. OM exam WFL. Pt fed self independently. Recommend continue current diet but discussed more of a [place] consistency(meats well-cut, cooked, moist foods) for Reflux precautions/Esophageal motility ease; thin liquids w/ general aspiration precautions. Recommend Reflux precautions and f/u w/ PCP and/or GI for further formal assessment of any Esophageal dysmotlity issues. General Reflux precautions discussed  and handouts given. No further skilled ST services indicated at this time. Pt agreed.  SLP Visit Diagnosis: Dysphagia, unspecified (R13.10)(pt has apparent Esophageal dysmotility complaints)    Aspiration Risk  (reduced risk for prandial aspiration:  reflux issues?)    Diet Recommendation  Regular diet w/ more of a Mech Soft consistency w/ meats(well-cut) and all foods moistened well; Thin liquids. General aspiration precautions; general Reflux precautions including Less/No carbonated drinks w/ Meals -- wait ~1 hour after a meal d/t the carbonation and Reflux potential.   Medication Administration: Whole meds with liquid    Other  Recommendations Recommended Consults: Consider GI evaluation;Consider esophageal assessment(Reflux; esophageal dysmotility complaints) Oral Care Recommendations: Oral care BID;Patient independent with oral care Other Recommendations: (n/a)   Follow up Recommendations None      Frequency and Duration (n/a)  (n/a)       Prognosis Prognosis for Safe Diet Advancement: Good Barriers to Reach Goals: (none)      Swallow Study   General Date of Onset: 07/18/19 HPI: Patient is a 78 y/o male with history of mulitple medical issues including recent heel/ankle wound w/ care at the [facility], hypertension, Diabetes Mellitus, HLD, CAD w/ CABG (4 vessel) in 2006 who presents after experiencing headache, dizziness, left arm weakness and became pale and short of breath and "zoned out" had slurred speech and blurred vision during a meal w/ family.  Weakness and slurred speech have resolved.  He has been having similar episodes in the past.  EKG shows slight ST elevation.  Head CT: No acute Intracranial hemorrhage.  NIHSS 0.  Etiology is unclear but cannot rule out TIA or Acute Ischemic Stroke just based on clinical presentation.  Per chart notation and pt's report, he had c/o dysphagia in 2018 -- pt revealed he had those "swallow tests where they lie you down on your side on the table and you drink the white liquid" -- this is describing a GI Barium study for Esophageal phase dysmotility. Pt stated "they did not find anything then".  He endorses "rice and meat getting caught on almost a ledge" pointing to his sternal notch area to  mid-sternum area.  He denied any overt coughing or choking w/ oral intake since this admission.  Noted CXR: "Both lungs are clear; no active disease".  MRI: "No acute intracranial abnormality; Generalized atrophy and chronic ischemic microangiopathy".  Type of Study: Bedside Swallow Evaluation Previous Swallow Assessment: pt described GI Barium swallow study  Diet Prior to this Study: Regular;Thin liquids Temperature Spikes Noted: No(wbc 5.6) Respiratory Status: Room air History of Recent Intubation: No Behavior/Cognition: Alert;Cooperative;Pleasant mood(memory impairment per chart notes) Oral Cavity Assessment: Within Functional Limits Oral Care Completed by SLP: Recent completion by staff Oral Cavity - Dentition: Adequate natural dentition Vision: Functional for self-feeding Self-Feeding Abilities: Able to feed self Patient Positioning: Upright in chair Baseline Vocal Quality: Normal Volitional Cough: Strong Volitional Swallow: Able to elicit    Oral/Motor/Sensory Function Overall Oral Motor/Sensory Function: Within functional limits(w/ bolus management; and w/ speech)   Ice Chips Ice chips: Not tested   Thin Liquid Thin Liquid: Within functional limits Presentation: Self Fed;Straw(sips; then ~3 ozs of tea at meal)    Nectar Thick Nectar Thick Liquid: Not tested   Honey Thick Honey Thick Liquid: Not tested   Puree Puree: Not tested   Solid     Solid: Within functional limits(Lunch meal) Presentation: Self Fed;Spoon(lunch meal -- several boluses of various foods)        Jerilynn SomKatherine Ivey Cina, MS,  CCC-SLP Maddi Collar 07/19/2019,2:59 PM

## 2019-07-19 NOTE — Consult Note (Signed)
Reason for Consult:Dizziness Referring Physician: Gouru  CC: Dizziness  HPI: Bryan Goin is an 78 y.o. male with a history of DM, HTN and HLD who was eating with family on yesterday.  Has acute onset at 1815 of feeling that his equilibrium was off.  Became dizzy.  Started to shake in both upper and lower extremities.  When he attempted to get up neither leg would hold him up.  Patient does not report any focality.  Patient did not lose consciousness.  Is back to baseline today.    Patient does have a history of dizzy spells that come with sitting but resolve quickly.    Past Medical History:  Diagnosis Date  . Coronary artery disease 2006   4-vessel CABG  . Hyperlipidemia   . Hypertension   . Type 2 diabetes mellitus (HCC)     Past Surgical History:  Procedure Laterality Date  . BYPASS GRAFT  2006   4-vessel  . TOE AMPUTATION Left     Family History  Problem Relation Age of Onset  . Seizures Brother   . Diabetes Mother   . Heart disease Sister     Social History:  reports that he has never smoked. He has never used smokeless tobacco. He reports previous alcohol use. He reports that he does not use drugs.  Allergies  Allergen Reactions  . No Known Allergies     Medications:  I have reviewed the patient's current medications. Prior to Admission:  Medications Prior to Admission  Medication Sig Dispense Refill Last Dose  . amitriptyline (ELAVIL) 50 MG tablet Take 50 mg by mouth at bedtime.      [name] atorvastatin (LIPITOR) 40 MG tablet Take 40 mg by mouth daily at 6 PM.      . Cholecalciferol (VITAMIN D3) 2000 units capsule Take 2,000 Units by mouth daily.      . Cyanocobalamin (B-12) 2500 MCG TABS Take 1 tablet by mouth daily.      . DOCOSAHEXAENOIC ACID PO Take 300 mg by mouth daily.      . furosemide (LASIX) 40 MG tablet TAKE 1 TABLET(40 MG) BY MOUTH EVERY DAY     . gabapentin (NEURONTIN) 300 MG capsule TAKE 4 CAPSULES(1200 MG) BY MOUTH TWICE DAILY     . HUMALOG KWIKPEN  100 UNIT/ML KwikPen INJECT 10 UNITS SUBCUTANEOUSLY DAILY AT LUNCH ONLY     . HUMALOG MIX 75/25 KWIKPEN (75-25) 100 UNIT/ML Kwikpen      . insulin lispro protamine-lispro (HUMALOG 75/25 MIX) (75-25) 100 UNIT/ML SUSP injection Inject into the skin.     [name] isosorbide mononitrate (IMDUR) 30 MG 24 hr tablet TK 1-2 TS PO D. TK 30 MG D. CAN INCREASE TO 60 MG D IF SYMPTOMS DO NOT RESOLVE     . levothyroxine (SYNTHROID, LEVOTHROID) 88 MCG tablet TAKE 1 TABLET BY MOUTH DAILY AT 6 AM     . losartan (COZAAR) 25 MG tablet Take 25 mg by mouth daily.      . Multiple Vitamins-Minerals (MULTIVITAMIN WITH MINERALS) tablet Take 1 tablet by mouth daily.      . Multiple Vitamins-Minerals (OCUVITE PO) Take 1 tablet by mouth daily.      . ONE TOUCH ULTRA TEST test strip TEST FOUR TIMES A DAY BEFORE MEALS AND NIGHTLY     . pentoxifylline (TRENTAL) 400 MG CR tablet TAKE 1 TABLET(400 MG) BY MOUTH TWICE DAILY     . pravastatin (PRAVACHOL) 80 MG tablet Take 80 mg by mouth.     [name]  pyridoxine (B-6) 100 MG tablet Take 100 mg by mouth.     . thiamine 100 MG tablet Take 100 mg by mouth.     [name]. UNABLE TO FIND Take by mouth.      Scheduled: . aspirin EC  325 mg Oral Daily  . atorvastatin  40 mg Oral q1800  . enoxaparin (LOVENOX) injection  40 mg Subcutaneous Q24H  . insulin aspart  0-15 Units Subcutaneous TID WC  . isosorbide mononitrate  30 mg Oral Daily  . levothyroxine  88 mcg Oral Q0600  . pentoxifylline  400 mg Oral BID  . vitamin B-12  2,500 mcg Oral Daily    ROS: History obtained from the patient  General ROS: negative for - chills, fatigue, fever, night sweats, weight gain or weight loss Psychological ROS: negative for - behavioral disorder, hallucinations, memory difficulties, mood swings or suicidal ideation Ophthalmic ROS: negative for - blurry vision, double vision, eye pain or loss of vision ENT ROS: vertigo Allergy and Immunology ROS: negative for - hives or itchy/watery eyes Hematological and Lymphatic  ROS: negative for - bleeding problems, bruising or swollen lymph nodes Endocrine ROS: negative for - galactorrhea, hair pattern changes, polydipsia/polyuria or temperature intolerance Respiratory ROS: negative for - cough, hemoptysis, shortness of breath or wheezing Cardiovascular ROS: negative for - chest pain, dyspnea on exertion, edema or irregular heartbeat Gastrointestinal ROS: negative for - abdominal pain, diarrhea, hematemesis, nausea/vomiting or stool incontinence Genito-Urinary ROS: negative for - dysuria, hematuria, incontinence or urinary frequency/urgency Musculoskeletal ROS: knee pain Neurological ROS: as noted in HPI Dermatological ROS: negative for rash and skin lesion changes  Physical Examination: Blood pressure (!) 157/63, pulse (!) 55, temperature 98.1 F (36.7 C), temperature source Oral, resp. rate 16, height 6\' 1"  (1.854 m), weight 110.8 kg, SpO2 98 %.  HEENT-  Normocephalic, no lesions, without obvious abnormality.  Normal external eye and conjunctiva.  Normal TM's bilaterally.  Normal auditory canals and external ears. Normal external nose, mucus membranes and septum.  Normal pharynx. Cardiovascular- S1, S2 normal, pulses palpable throughout   Lungs- chest clear, no wheezing, rales, normal symmetric air entry Abdomen- soft, non-tender; bowel sounds normal; no masses,  no organomegaly Extremities- no edema Lymph-no adenopathy palpable Musculoskeletal-no joint tenderness, deformity or swelling Skin-warm and dry, no hyperpigmentation, vitiligo, or suspicious lesions  Neurological Examination   Mental Status: Alert, oriented, thought content appropriate.  Speech fluent without evidence of aphasia.  Able to follow 3 step commands without difficulty. Cranial Nerves: II: Discs flat bilaterally; Visual fields grossly normal, pupils equal, round, reactive to light and accommodation III,IV, VI: ptosis not present, extra-ocular motions intact bilaterally V,VII: smile  symmetric, facial light touch sensation normal bilaterally VIII: hearing normal bilaterally IX,X: gag reflex present XI: bilateral shoulder shrug XII: midline tongue extension Motor: Right : Upper extremity   5/5    Left:     Upper extremity   5/5  Lower extremity   5/5     Lower extremity   5/5 Tone and bulk:normal tone throughout; no atrophy noted Sensory: Pinprick and light touch intact throughout, bilaterally Deep Tendon Reflexes: Symmetric throughout Plantars: Right: mute   Left: mute Cerebellar: Normal finger-to-nose and normal heel-to-shin testing bilaterally Gait: not tested due to safety concerns    Laboratory Studies:   Basic Metabolic Panel: Recent Labs  Lab 07/18/19 1920 07/19/19 0509  NA 142 141  K 4.3 4.9  CL 105 106  CO2 24 27  GLUCOSE 132* 187*  BUN 27* 31*  CREATININE  1.64* 1.24  CALCIUM 8.9 8.7*    Liver Function Tests: Recent Labs  Lab 07/18/19 1920  AST 35  ALT 43  ALKPHOS 93  BILITOT 0.6  PROT 7.1  ALBUMIN 3.7   No results for input(s): LIPASE, AMYLASE in the last 168 hours. No results for input(s): AMMONIA in the last 168 hours.  CBC: Recent Labs  Lab 07/18/19 1920 07/19/19 0509  WBC 6.7 5.6  NEUTROABS 3.8  --   HGB 12.7* 12.0*  HCT 38.1* 36.6*  MCV 90.5 91.7  PLT 158 129*    Cardiac Enzymes: No results for input(s): CKTOTAL, CKMB, CKMBINDEX, TROPONINI in the last 168 hours.  BNP: Invalid input(s): POCBNP  CBG: Recent Labs  Lab 07/19/19 0732  GLUCAP 136*    Microbiology: Results for orders placed or performed during the hospital encounter of 07/18/19  SARS CORONAVIRUS 2 (TAT 6-24 HRS) Nasopharyngeal Nasopharyngeal Swab     Status: None   Collection Time: 07/18/19 11:44 PM   Specimen: Nasopharyngeal Swab  Result Value Ref Range Status   SARS Coronavirus 2 NEGATIVE NEGATIVE Final    Comment: (NOTE) SARS-CoV-2 target nucleic acids are NOT DETECTED. The SARS-CoV-2 RNA is generally detectable in upper and  lower respiratory specimens during the acute phase of infection. Negative results do not preclude SARS-CoV-2 infection, do not rule out co-infections with other pathogens, and should not be used as the sole basis for treatment or other patient management decisions. Negative results must be combined with clinical observations, patient history, and epidemiological information. The expected result is Negative. Fact Sheet for Patients: SugarRoll.be Fact Sheet for Healthcare Providers: https://www.woods-mathews.com/ This test is not yet approved or cleared by the [place] FDA and  has been authorized for detection and/or diagnosis of SARS-CoV-2 by FDA under an Emergency Use Authorization (EUA). This EUA will remain  in effect (meaning this test can be used) for the duration of the COVID-19 declaration under Section 56 4(b)(1) of the Act, 21 U.S.C. section 360bbb-3(b)(1), unless the authorization is terminated or revoked sooner. Performed at [facility], [place] [address], [place], [place] [address]     Coagulation Studies: Recent Labs    07/18/19 1920  LABPROT 14.2  INR 1.1    Urinalysis: No results for input(s): COLORURINE, LABSPEC, PHURINE, GLUCOSEU, HGBUR, BILIRUBINUR, KETONESUR, PROTEINUR, UROBILINOGEN, NITRITE, LEUKOCYTESUR in the last 168 hours.  Invalid input(s): APPERANCEUR  Lipid Panel:  No results found for: CHOL, TRIG, HDL, CHOLHDL, VLDL, LDLCALC  HgbA1C:  Lab Results  Component Value Date   HGBA1C 7.6 (H) 07/19/2019    Urine Drug Screen:  No results found for: LABOPIA, COCAINSCRNUR, LABBENZ, AMPHETMU, THCU, LABBARB  Alcohol Level: No results for input(s): ETH in the last 168 hours.  Other results: EKG: sinus rhythm at 62 bpm.  Imaging: Dg Chest 1 View  Result Date: 07/18/2019 CLINICAL DATA:  Dizziness EXAM: CHEST  1 VIEW COMPARISON:  None. FINDINGS: The heart size and mediastinal contours are within normal  limits. Both lungs are clear. The visualized skeletal structures are unremarkable. IMPRESSION: No active disease. Electronically Signed   By: Ulyses Jarred M.D.   On: 07/18/2019 20:15   Ct Head Wo Contrast  Result Date: 07/18/2019 CLINICAL DATA:  Altered level of consciousness (LOC), unexplained. Additional history provided: Dizziness, shortness of breath, speech difficulty, difficulty walking, blurred vision EXAM: CT HEAD WITHOUT CONTRAST TECHNIQUE: Contiguous axial images were obtained from the base of the skull through the vertex without intravenous contrast. COMPARISON:  No pertinent prior studies  available for comparison. FINDINGS: Brain: No evidence of acute intracranial hemorrhage. No demarcated cortical infarction. No evidence of intracranial mass. No midline shift or extra-axial fluid collection. Minimal scattered ill-defined hypoattenuation within the cerebral white matter is nonspecific, but consistent with chronic small vessel ischemic disease. Mild generalized parenchymal atrophy. Vascular: No hyperdense vessel.  Atherosclerotic calcifications. Skull: Normal. Negative for fracture or focal lesion. Sinuses/Orbits: Visualized orbits demonstrate no acute abnormality. Minimal right sphenoid sinus mucosal thickening. No significant mastoid effusion. IMPRESSION: 1. No CT evidence of acute intracranial abnormality. 2. Mild generalized parenchymal atrophy with minimal chronic small vessel ischemic disease. Electronically Signed   By: Jackey Loge DO   On: 07/18/2019 20:10   Mr Brain Wo Contrast  Result Date: 07/19/2019 CLINICAL DATA:  Stroke follow-up. Encephalopathy. EXAM: MRI HEAD WITHOUT CONTRAST TECHNIQUE: Multiplanar, multiecho pulse sequences of the brain and surrounding structures were obtained without intravenous contrast. COMPARISON:  Head CT 07/18/2019 FINDINGS: BRAIN: There is no acute infarct, acute hemorrhage or extra-axial collection. Multifocal white matter hyperintensity, most commonly  due to chronic ischemic microangiopathy. There is generalized atrophy without lobar predilection. The midline structures are normal. 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 VASCULAR: The major intracranial arterial and venous sinus flow voids are normal. Susceptibility-sensitive sequences show no chronic microhemorrhage or superficial siderosis. SKULL AND UPPER CERVICAL SPINE: Calvarial bone marrow signal is normal. There is no skull base mass. The visualized upper cervical spine and soft tissues are normal. SINUSES/ORBITS: There are no fluid levels or advanced mucosal thickening. The mastoid air cells and middle ear cavities are free of fluid. The orbits are normal. IMPRESSION: 1. No acute intracranial abnormality. 2. 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 Electronically Signed   By: Deatra Robinson M.D.   On: 07/19/2019 01:51   US Carotid Bilateral  Result Date: 07/19/2019 CLINICAL DATA:  78 year old male with symptoms of transient ischemic attack EXAM: BILATERAL CAROTID DUPLEX ULTRASOUND TECHNIQUE: Wallace Cullens scale imaging, color Doppler and duplex ultrasound were performed of bilateral carotid and vertebral arteries in the neck. COMPARISON:  None. FINDINGS: Criteria: Quantification of carotid stenosis is based on velocity parameters that correlate the residual internal carotid diameter with NASCET-based stenosis levels, using the diameter of the distal internal carotid lumen as the denominator for stenosis measurement. The following velocity measurements were obtained: RIGHT ICA: 92/13 cm/sec CCA: 70/6 cm/sec SYSTOLIC ICA/CCA RATIO:  1.3 ECA:  95 cm/sec LEFT ICA: 94/18 cm/sec CCA: 95/8 cm/sec SYSTOLIC ICA/CCA RATIO:  1.0 ECA:  113 cm/sec RIGHT CAROTID ARTERY: Mild focal heterogeneous atherosclerotic plaque in the proximal internal carotid artery. By peak systolic velocity criteria, the estimated stenosis is less than 50%. RIGHT  VERTEBRAL ARTERY:  Patent with normal antegrade flow. LEFT CAROTID ARTERY: Mild focal heterogeneous atherosclerotic plaque in the proximal internal carotid artery. By peak systolic velocity criteria, the estimated stenosis is less than 50%. LEFT VERTEBRAL ARTERY:  Patent with normal antegrade flow. IMPRESSION: 1. 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 2. Mild (1-49%) stenosis proximal left internal carotid artery secondary to heterogenous atherosclerotic plaque. 3. Vertebral arteries are patent with normal antegrade flow. Signed, Sterling Big, MD, RPVI [facility] Radiology Electronically Signed   By: Malachy Moan M.D.   On: 07/19/2019 09:48     Assessment/Plan: 78 year old male presenting with a episode of dizziness and generalized weakness/shakiness.  Symptoms now resolved.  Somewhat atypical for TIA/acute infarct.  MRI of the brain reviewed and shows no acute changes.  Carotid dopplers show no evidence of hemodynamically significant stenosis.  Echocardiogram pending.  A1c 7.6.  Recommendations: 1. Patient to continue ASA and statin 2. Telemetry 3. Frequent neuro checks 4. EEG    Thana Farr, MD Neurology [phone] 07/19/2019, 10:53 AM

## 2019-07-19 NOTE — Progress Notes (Signed)
eeg completed ° °

## 2019-07-19 NOTE — Progress Notes (Signed)
*  PRELIMINARY RESULTS* Echocardiogram 2D Echocardiogram has been performed.  Sherrie Sport 07/19/2019, 12:04 PM

## 2019-07-19 NOTE — Consult Note (Addendum)
[facility] Nurse wound consult note Reason for Consult: Consult requested for right heel.  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  He declines removal of dressing and assessment at this time, since the topical treatment he uses must be brought in from home and dressing can be changed later today with assistance from the bedside nurse.  He states he is using a "blue dressing" which has been prescribed by the outpatient wound care center and is applied 3 times a week. This is not available in the [place] and he is having his wife bring it into the hospital for use.  Description is consistent with a product such as Hydroferra blue.  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  Bedside nurse; please assist with application and covering with gauze and tape Q M/W/F.  Pt should resume follow-up with the outpatient wound care center after discharge.  Please re-consult if further assistance is needed.  Thank-you,  Julien Girt MSN, [place], [place], [place], [place]

## 2019-07-19 NOTE — ED Notes (Signed)
Patient given phone to speak with MRI tech for screening

## 2019-07-19 NOTE — Progress Notes (Signed)
[facility] [place] at [facility]   PATIENT NAME: Chris ClearMichael Hagger    MR#:  [id number]  DATE OF BIRTH:  August 26, 1941  SUBJECTIVE:  CHIEF COMPLAINT: Patient is resting comfortably, dizziness improved Awaiting for EEG  REVIEW OF SYSTEMS:  CONSTITUTIONAL: No fever, fatigue or weakness.  EYES: No blurred or double vision.  EARS, NOSE, AND THROAT: No tinnitus or ear pain.  RESPIRATORY: No cough, shortness of breath, wheezing or hemoptysis.  CARDIOVASCULAR: No chest pain, orthopnea, edema.  GASTROINTESTINAL: No nausea, vomiting, diarrhea or abdominal pain.  GENITOURINARY: No dysuria, hematuria.  ENDOCRINE: No polyuria, nocturia,  HEMATOLOGY: No anemia, easy bruising or bleeding SKIN: No rash or lesion. MUSCULOSKELETAL: No joint pain or arthritis.   NEUROLOGIC: No tingling, numbness, weakness.  PSYCHIATRY: No anxiety or depression.   DRUG ALLERGIES:   Allergies  Allergen Reactions  . No Known Allergies     VITALS:  Blood pressure (!) 157/63, pulse (!) 55, temperature 98.1 F (36.7 C), temperature source Oral, resp. rate 16, height 6\' 1"  (1.854 m), weight 110.8 kg, SpO2 98 %.  PHYSICAL EXAMINATION:  GENERAL:  78 y.o.-year-old patient lying in the bed with no acute distress.  EYES: Pupils equal, round, reactive to light and accommodation. No scleral icterus. Extraocular muscles intact.  HEENT: Head atraumatic, normocephalic. Oropharynx and nasopharynx [name].  NECK:  Supple, no jugular venous distention. No thyroid enlargement, no tenderness.  LUNGS: Normal breath sounds bilaterally, no wheezing, rales,rhonchi or crepitation. No use of accessory muscles of respiration.  CARDIOVASCULAR: S1, S2 normal. No murmurs, rubs, or gallops.  ABDOMEN: Soft, nontender, nondistended. Bowel sounds present.  EXTREMITIES: No pedal edema, cyanosis, or clubbing.  NEUROLOGIC: Cranial nerves II through XII are intact. Muscle strength 5/5 in all extremities. Sensation intact. Gait  not checked.  PSYCHIATRIC: The patient is alert and oriented x 3.  SKIN: No obvious rash, lesion, or ulcer.    LABORATORY PANEL:   CBC Recent Labs  Lab 07/19/19 0509  WBC 5.6  HGB 12.0*  HCT 36.6*  PLT 129*   ------------------------------------------------------------------------------------------------------------------  Chemistries  Recent Labs  Lab 07/18/19 1920 07/19/19 0509  NA 142 141  K 4.3 4.9  CL 105 106  CO2 24 27  GLUCOSE 132* 187*  BUN 27* 31*  CREATININE 1.64* 1.24  CALCIUM 8.9 8.7*  AST 35  --   ALT 43  --   ALKPHOS 93  --   BILITOT 0.6  --    ------------------------------------------------------------------------------------------------------------------  Cardiac Enzymes No results for input(s): TROPONINI in the last 168 hours. ------------------------------------------------------------------------------------------------------------------  RADIOLOGY:  Dg Chest 1 View  Result Date: 07/18/2019 CLINICAL DATA:  Dizziness EXAM: CHEST  1 VIEW COMPARISON:  None. FINDINGS: The heart size and mediastinal contours are within normal limits. Both lungs are [name]. The visualized skeletal structures are unremarkable. IMPRESSION: No active disease. Electronically Signed   By: Deatra RobinsonKevin  Herman M.D.   On: 07/18/2019 20:15   Ct Head Wo Contrast  Result Date: 07/18/2019 CLINICAL DATA:  Altered level of consciousness (LOC), unexplained. Additional history provided: Dizziness, shortness of breath, speech difficulty, difficulty walking, blurred vision EXAM: CT HEAD WITHOUT CONTRAST TECHNIQUE: Contiguous axial images were obtained from the base of the skull through the vertex without intravenous contrast. COMPARISON:  No pertinent prior studies available for comparison. FINDINGS: Brain: No evidence of acute intracranial hemorrhage. No demarcated cortical infarction. No evidence of intracranial mass. No midline shift or extra-axial fluid collection. Minimal scattered  ill-defined hypoattenuation within the cerebral white matter is nonspecific,  but consistent with chronic small vessel ischemic disease. Mild generalized parenchymal atrophy. Vascular: No hyperdense vessel.  Atherosclerotic calcifications. Skull: Normal. Negative for fracture or focal lesion. Sinuses/Orbits: Visualized orbits demonstrate no acute abnormality. Minimal right sphenoid sinus mucosal thickening. No significant mastoid effusion. IMPRESSION: 1. No CT evidence of acute intracranial abnormality. 2. Mild generalized parenchymal atrophy with minimal chronic small vessel ischemic disease. Electronically Signed   By: Kellie Simmering DO   On: 07/18/2019 20:10   Mr Brain Wo Contrast  Result Date: 07/19/2019 CLINICAL DATA:  Stroke follow-up. Encephalopathy. EXAM: MRI HEAD WITHOUT CONTRAST TECHNIQUE: Multiplanar, multiecho pulse sequences of the brain and surrounding structures were obtained without intravenous contrast. COMPARISON:  Head CT 07/18/2019 FINDINGS: BRAIN: There is no acute infarct, acute hemorrhage or extra-axial collection. Multifocal white matter hyperintensity, most commonly due to chronic ischemic microangiopathy. There is generalized atrophy without lobar predilection. The midline structures are normal. 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 VASCULAR: The major intracranial arterial and venous sinus flow voids are normal. Susceptibility-sensitive sequences show no chronic microhemorrhage or superficial siderosis. SKULL AND UPPER CERVICAL SPINE: Calvarial bone marrow signal is normal. There is no skull base mass. The visualized upper cervical spine and soft tissues are normal. SINUSES/ORBITS: There are no fluid levels or advanced mucosal thickening. The mastoid air cells and middle ear cavities are free of fluid. The orbits are normal. IMPRESSION: 1. No acute intracranial abnormality. 2. Generalized atrophy and chronic ischemic microangiopathy. 3. 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 Electronically Signed   By: Ulyses Jarred M.D.   On: 07/19/2019 01:51   US Carotid Bilateral  Result Date: 07/19/2019 CLINICAL DATA:  78 year old male with symptoms of transient ischemic attack EXAM: BILATERAL CAROTID DUPLEX ULTRASOUND TECHNIQUE: Pearline Cables scale imaging, color Doppler and duplex ultrasound were performed of bilateral carotid and vertebral arteries in the neck. COMPARISON:  None. FINDINGS: Criteria: Quantification of carotid stenosis is based on velocity parameters that correlate the residual internal carotid diameter with NASCET-based stenosis levels, using the diameter of the distal internal carotid lumen as the denominator for stenosis measurement. The following velocity measurements were obtained: RIGHT ICA: 92/13 cm/sec CCA: 98/9 cm/sec SYSTOLIC ICA/CCA RATIO:  1.3 ECA:  95 cm/sec LEFT ICA: 94/18 cm/sec CCA: 21/1 cm/sec SYSTOLIC ICA/CCA RATIO:  1.0 ECA:  113 cm/sec RIGHT CAROTID ARTERY: Mild focal heterogeneous atherosclerotic plaque in the proximal internal carotid artery. By peak systolic velocity criteria, the estimated stenosis is less than 50%. RIGHT VERTEBRAL ARTERY:  Patent with normal antegrade flow. LEFT CAROTID ARTERY: Mild focal heterogeneous atherosclerotic plaque in the proximal internal carotid artery. By peak systolic velocity criteria, the estimated stenosis is less than 50%. LEFT VERTEBRAL ARTERY:  Patent with normal antegrade flow. IMPRESSION: 1. 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 2. Mild (1-49%) stenosis proximal left internal carotid artery secondary to heterogenous atherosclerotic plaque. 3. Vertebral arteries are patent with normal antegrade flow. Signed, Criselda Peaches, MD, [facility] Electronically Signed   By: Jacqulynn Cadet M.D.   On: 07/19/2019 09:48    EKG:   Orders placed or performed during the hospital  encounter of 07/18/19  . EKG    ASSESSMENT AND PLAN:    1.  Generalized weakness with mild left upper extremity focal weakness, dizziness and headache, concerning for TIA/CVA. Ruled out acute CVA with negative CT head and no acute findings on MRI of the brain. Carotid Dopplers with minimal stenosis  bilaterally Neurology has seen the patient and recommending EEG  aspirin and statin  Echocardiogram done results are pending  PT is recommending home health PT   2.  Abnormal EKG.  Dr. Serita Kyle assessment and recommendation are appreciated.  We will continue aspirin and statin therapy.  Echocardiogram done results are pending  3.  Acute kidney injury.    Probably prerenal provided IV fluids  we will hold Cozaar and diuretic therapy. Check orthostatics  4.  Type 2 diabetes mellitus.  He will be placed on subcu supplemental coverage with NovoLog and will continue basal coverage.  5.  Hypothyroidism.  We will continue Synthroid and check TSH level.  6.  Dyslipidemia.  Statin therapy will be resumed  7.  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  We will continue aspirin, Imdur and statin therapy.  8.  DVT prophylaxis.  Subtenons Lovenox    All the records are reviewed and case discussed with Care Management/Social Workerr. Management plans discussed with the patient, family and they are in agreement.  CODE STATUS: fc   TOTAL TIME TAKING CARE OF THIS PATIENT: 36  minutes.   POSSIBLE D/C IN 1  DAYS, DEPENDING ON CLINICAL CONDITION.  Note: This dictation was prepared with Dragon dictation along with smaller phrase technology. Any transcriptional errors that result from this process are unintentional.   Ramonita Lab M.D on 07/19/2019 at 2:48 PM  Between 7am to 6pm - Pager - [phone] After 6pm go to [email or web address] - password [email or web address] [facility]  [facility]  Office  [phone]  CC: Primary care physician; Hoover Browns, MD

## 2019-07-20 LAB — GLUCOSE, CAPILLARY
Glucose-Capillary: 230 mg/dL — ABNORMAL HIGH (ref 70–99)
Glucose-Capillary: 249 mg/dL — ABNORMAL HIGH (ref 70–99)

## 2019-07-20 LAB — BASIC METABOLIC PANEL
Anion gap: 7 (ref 5–15)
BUN: 22 mg/dL (ref 8–23)
CO2: 26 mmol/L (ref 22–32)
Calcium: 8.7 mg/dL — ABNORMAL LOW (ref 8.9–10.3)
Chloride: 105 mmol/L (ref 98–111)
Creatinine, Ser: 1 mg/dL (ref 0.61–1.24)
GFR calc Af Amer: >60 mL/min (ref >60)
GFR calc non Af Amer: >60 mL/min (ref >60)
Glucose, Bld: 223 mg/dL — ABNORMAL HIGH (ref 70–99)
Potassium: 4.5 mmol/L (ref 3.5–5.1)
Sodium: 138 mmol/L (ref 135–145)

## 2019-07-20 MED ORDER — INSULIN LISPRO PROT & LISPRO (75-25 MIX) 100 UNIT/ML ~~LOC~~ SUSP: 30.0000 [IU] | Freq: Two times a day (BID) | SUBCUTANEOUS | 1 refills | Status: DC

## 2019-07-20 MED ORDER — B-12 2500 MCG PO TABS: 1.0000 | ORAL_TABLET | Freq: Every day | ORAL | 0 refills | Status: DC

## 2019-07-20 NOTE — TOC Transition Note (Signed)
Transition of Care [facility]) - CM/SW Discharge Note   Patient Details  Name: Chris [name] MRN: [id number] Date of Birth: 03-12-1941  Transition of Care [facility]) CM/SW Contact:  Candie Chroman, LCSW Phone Number: 07/20/2019, 1:19 PM   Clinical Narrative: CSW met with patient. Wife at bedside. CSW introduced role and explained that PT recommendations would be discussed. Patient and his wife agreeable to HHPT. Reviewed CMS scores for agencies that serve their zip code. First preference is [employer]. Referral made and accepted. Patient has orders to discharge home today. No further concerns. CSW signing off.    Final next level of care: Home w Home Health Services Barriers to Discharge: Barriers Resolved   Patient Goals and CMS Choice   CMS Medicare.gov Compare Post Acute Care list provided to:: Patient(Wife at bedside.) Choice offered to / list presented to : Patient, Spouse  Discharge Placement                       Discharge Plan and Services     Post Acute Care Choice: [facility]: PT [facility] Agency: [place] ([place]) Date [facility] Agency Contacted: 07/20/19   Representative spoke with at [place]: Floydene Flock  Social Determinants of Health (SDOH) Interventions     Readmission Risk Interventions No flowsheet data found.

## 2019-07-20 NOTE — Discharge Summary (Signed)
[facility] [place] at [facility]   PATIENT NAME: Chris ClearMichael [name]    MR#:  [id number]  DATE OF BIRTH:  Oct 01, 1940  DATE OF ADMISSION:  07/18/2019 ADMITTING PHYSICIAN: Hannah BeatJan A Mansy, MD  DATE OF DISCHARGE:  07/20/19  PRIMARY CARE PHYSICIAN: Hoover BrownsPatel, Bansari Gautam, MD    ADMISSION DIAGNOSIS:  TIA (transient ischemic attack) [G45.9] Dizziness [R42] Abnormal ECG [R94.31]  DISCHARGE DIAGNOSIS:   Dizziness AKI SECONDARY DIAGNOSIS:   Past Medical History:  Diagnosis Date  . Coronary artery disease 2006   4-vessel CABG  . Hyperlipidemia   . Hypertension   . Type 2 diabetes mellitus [facility](HCC)     HOSPITAL COURSE:  Chris [name]  is a 78 y.o. Caucasian male with a known history of diabetes mellitus, hypertension, sleep edema in coronary artery disease, presented to the emergency room with acute onset of dizziness, feeling woozy, blurred vision and frontal headache with generalized weakness making him unable to walk or lift his left arm, which started at 6:15 PM.  He denied any paresthesias.  No chest pain or palpitations.  No nausea or vomiting.  No tinnitus or vertigo.  He was noted to be shaking in both upper and lower extremities without losing consciousness.  He denied any slurred speech or expressive dysphasia.  No cough or wheezing.  No sick exposures.  Upon presentation to the emergency room, blood pressure was 121/50 with respiratory to 22 and otherwise normal vital signs.  Labs revealed BUN of 27 and creatinine 1.64 and anemia.  COVID-19 test is pending.[name].  Head CT scan without contrast came back with no acute intracranial normalities.  Portable chest x-ray showed no acute cardiopulmonary disease.  EKG showed normal sinus rhythm with a rate of 73 with inferior about 1 mm ST segment elevation with some J-point elevation.  Repeat EKG showed normal sinus rhythm with a rate of 65 with half a millimeter inferior ST segment elevation and a repeat 1 showed normal sinus  rhythm with a rate of 62 with normal ST segments.  The patient was seen by Dr. Okey DupreEnd who did not think that he had a STEMI.  The patient was given 4 baby aspirin.  He will be admitted to a telemetry bed for further evaluation and management.  1. Generalized weakness with mild left upper extremity focal weakness, dizziness and headache, concerning for TIA/CVA. Ruled out acute CVA with negative CT head and no acute findings on MRI of the brain. Carotid Dopplers with minimal stenosis bilaterally Neurology has seen the patient and EEG is normal Continue aspirin and statin Echocardiogram with ejection fraction 55 to 60% PT is recommending home health PT  Outpatient follow-up with neurology and [facility] as recommended Recommended slow postural changes from supine to standing posture  2. Abnormal EKG. Dr. Serita KyleEnd's assessment and recommendation are appreciated. We will continue aspirin and statin therapy.  Echocardiogram -with the 55 to 60% ejection fraction, no cardiac interventions are needed at this time  3. Acute kidney injury.   Probably prerenal  provided IV fluids, renal function is back to normal Resuming home dose Lasix  4. Type 2 diabetes mellitus. Patient tolerated the sliding scale insulin during the hospital course and will discharge him home with low-dose 75/25  subcutaneously twice a day dose reduced from 75 units to 30  Units  5. Hypothyroidism. We will continue Synthroid .  6. Dyslipidemia. Statin therapy will be resumed  7. 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 We will continue aspirin, Imdur  and statin therapy.  8. DVT prophylaxis. Subtenons Lovenox  Disposition Home with home health PT DISCHARGE CONDITIONS:   fair  CONSULTS OBTAINED:  Treatment Team:  Catarina Hartshorn, MD   PROCEDURES none  DRUG ALLERGIES:   Allergies  Allergen Reactions  . No Known Allergies     DISCHARGE MEDICATIONS:   Allergies as of  07/20/2019      Reactions   No Known Allergies       Medication List    TAKE these medications   aspirin EC 81 MG tablet Take 81 mg by mouth daily.   atorvastatin 40 MG tablet Commonly known as: LIPITOR Take 40 mg by mouth daily at 6 PM.   B-12 2500 MCG Tabs Take 1 tablet by mouth daily.   cetirizine 10 MG tablet Commonly known as: ZYRTEC Take 10 mg by mouth daily.   co-enzyme Q-10 30 MG capsule Take 30 mg by mouth daily.   furosemide 40 MG tablet Commonly known as: LASIX TAKE 1 TABLET(40 MG) BY MOUTH EVERY DAY   gabapentin 300 MG capsule Commonly known as: NEURONTIN TAKE 4 CAPSULES(1200 MG) BY MOUTH TWICE DAILY   insulin lispro protamine-lispro (75-25) 100 UNIT/ML Susp injection Commonly known as: HUMALOG 75/25 MIX Inject 30 Units into the skin 2 (two) times daily. What changed: how much to take   isosorbide mononitrate 30 MG 24 hr tablet Commonly known as: IMDUR TK 1-2 TS PO D. TK 30 MG D. CAN INCREASE TO 60 MG D IF SYMPTOMS DO NOT RESOLVE   levothyroxine 88 MCG tablet Commonly known as: SYNTHROID TAKE 1 TABLET BY MOUTH DAILY AT 6 AM   losartan 25 MG tablet Commonly known as: COZAAR Take 25 mg by mouth daily.   multivitamin with minerals tablet Take 1 tablet by mouth daily.   omega-3 acid ethyl esters 1 g capsule Commonly known as: LOVAZA Take 1 g by mouth 2 (two) times daily.   pentoxifylline 400 MG CR tablet Commonly known as: TRENTAL TAKE 1 TABLET(400 MG) BY MOUTH TWICE DAILY        DISCHARGE INSTRUCTIONS:   Follow-up with primary care physician in 3 days Follow-up with [facility] cardiology in 1 week Follow-up with neurology-Dr. Melrose Nakayama in 2 weeks Home health PT  DIET:  Cardiac diet and Diabetic diet  DISCHARGE CONDITION:  Fair  ACTIVITY:  Activity as tolerated  OXYGEN:  Home Oxygen: No.   Oxygen Delivery: room air  DISCHARGE LOCATION:  home with home health PT  If you experience worsening of your admission symptoms, develop  shortness of breath, life threatening emergency, suicidal or homicidal thoughts you must seek medical attention immediately by calling 911 or calling your MD immediately  if symptoms less severe.  You Must read complete instructions/literature along with all the possible adverse reactions/side effects for all the Medicines you take and that have been prescribed to you. Take any new Medicines after you have completely understood and accpet all the possible adverse reactions/side effects.   Please note  You were cared for by a hospitalist during your hospital stay. If you have any questions about your discharge medications or the care you received while you were in the hospital after you are discharged, you can call the unit and asked to speak with the hospitalist on call if the hospitalist that took care of you is not available. Once you are discharged, your primary care physician will handle any further medical issues. Please note that NO REFILLS for any discharge medications will be authorized  once you are discharged, as it is imperative that you return to your primary care physician (or establish a relationship with a primary care physician if you do not have one) for your aftercare needs so that they can reassess your need for medications and monitor your lab values.     Today  Chief Complaint  Patient presents with  . Altered Mental Status   Patient is feeling much better today wife at bedside.  He sees [facility] cardiology as an outpatient.  Renal function improved and will discharge patient home to see outpatient cardiology and neurology  ROS:  CONSTITUTIONAL: Denies fevers, chills. Denies any fatigue, weakness.  EYES: Denies blurry vision, double vision, eye pain. EARS, NOSE, THROAT: Denies tinnitus, ear pain, hearing loss. RESPIRATORY: Denies cough, wheeze, shortness of breath.  CARDIOVASCULAR: Denies chest pain, palpitations, edema.  GASTROINTESTINAL: Denies nausea, vomiting, diarrhea,  abdominal pain. Denies bright red blood per rectum. GENITOURINARY: Denies dysuria, hematuria. ENDOCRINE: Denies nocturia or thyroid problems. HEMATOLOGIC AND LYMPHATIC: Denies easy bruising or bleeding. SKIN: Denies rash or lesion. MUSCULOSKELETAL: Denies pain in neck, back, shoulder, knees, hips or arthritic symptoms.  NEUROLOGIC: Denies paralysis, paresthesias.  PSYCHIATRIC: Denies anxiety or depressive symptoms.   VITAL SIGNS:  Blood pressure (!) 152/73, pulse 64, temperature 97.6 F (36.4 C), temperature source Oral, resp. rate 16, height 6\' 1"  (1.854 m), weight 112.2 kg, SpO2 99 %.  I/O:    Intake/Output Summary (Last 24 hours) at 07/20/2019 1323 Last data filed at 07/20/2019 1015 Gross per 24 hour  Intake 360 ml  Output 1525 ml  Net -1165 ml    PHYSICAL EXAMINATION:  GENERAL:  78 y.o.-year-old patient lying in the bed with no acute distress.  EYES: Pupils equal, round, reactive to light and accommodation. No scleral icterus. Extraocular muscles intact.  HEENT: Head atraumatic, normocephalic. Oropharynx and nasopharynx [name].  NECK:  Supple, no jugular venous distention. No thyroid enlargement, no tenderness.  LUNGS: Normal breath sounds bilaterally, no wheezing, rales,rhonchi or crepitation. No use of accessory muscles of respiration.  CARDIOVASCULAR: S1, S2 normal. No murmurs, rubs, or gallops.  ABDOMEN: Soft, non-tender, non-distended. Bowel sounds present. EXTREMITIES: No pedal edema, cyanosis, or clubbing.  NEUROLOGIC: Cranial nerves II through XII are intact. Muscle strength 5/5 in all extremities. Sensation intact. Gait not checked.  PSYCHIATRIC: The patient is alert and oriented x 3.  SKIN: No obvious rash, lesion, or ulcer.   DATA REVIEW:   CBC Recent Labs  Lab 07/19/19 0509  WBC 5.6  HGB 12.0*  HCT 36.6*  PLT 129*    Chemistries  Recent Labs  Lab 07/18/19 1920  07/20/19 0439  NA 142   < > 138  K 4.3   < > 4.5  CL 105   < > 105  CO2 24   < > 26   GLUCOSE 132*   < > 223*  BUN 27*   < > 22  CREATININE 1.64*   < > 1.00  CALCIUM 8.9   < > 8.7*  AST 35  --   --   ALT 43  --   --   ALKPHOS 93  --   --   BILITOT 0.6  --   --    < > = values in this interval not displayed.    Cardiac Enzymes No results for input(s): TROPONINI in the last 168 hours.  Microbiology Results  Results for orders placed or performed during the hospital encounter of 07/18/19  SARS CORONAVIRUS 2 (TAT 6-24 HRS)  Nasopharyngeal Nasopharyngeal Swab     Status: None   Collection Time: 07/18/19 11:44 PM   Specimen: Nasopharyngeal Swab  Result Value Ref Range Status   SARS Coronavirus 2 NEGATIVE NEGATIVE Final    Comment: (NOTE) SARS-CoV-2 target nucleic acids are NOT DETECTED. The SARS-CoV-2 RNA is generally detectable in upper and lower respiratory specimens during the acute phase of infection. Negative results do not preclude SARS-CoV-2 infection, do not rule out co-infections with other pathogens, and should not be used as the sole basis for treatment or other patient management decisions. Negative results must be combined with clinical observations, patient history, and epidemiological information. The expected result is Negative. Fact Sheet for Patients: HairSlick.no Fact Sheet for Healthcare Providers: quierodirigir.com This test is not yet approved or cleared by the [place] FDA and  has been authorized for detection and/or diagnosis of SARS-CoV-2 by FDA under an Emergency Use Authorization (EUA). This EUA will remain  in effect (meaning this test can be used) for the duration of the COVID-19 declaration under Section 56 4(b)(1) of the Act, 21 U.S.C. section 360bbb-3(b)(1), unless the authorization is terminated or revoked sooner. Performed at [facility], [address], [place], [place] [address]     RADIOLOGY:  Dg Chest 1 View  Result Date: 07/18/2019 CLINICAL DATA:   Dizziness EXAM: CHEST  1 VIEW COMPARISON:  None. FINDINGS: The heart size and mediastinal contours are within normal limits. Both lungs are [name]. The visualized skeletal structures are unremarkable. IMPRESSION: No active disease. Electronically Signed   By: Deatra Robinson M.D.   On: 07/18/2019 20:15   Ct Head Wo Contrast  Result Date: 07/18/2019 CLINICAL DATA:  Altered level of consciousness (LOC), unexplained. Additional history provided: Dizziness, shortness of breath, speech difficulty, difficulty walking, blurred vision EXAM: CT HEAD WITHOUT CONTRAST TECHNIQUE: Contiguous axial images were obtained from the base of the skull through the vertex without intravenous contrast. COMPARISON:  No pertinent prior studies available for comparison. FINDINGS: Brain: No evidence of acute intracranial hemorrhage. No demarcated cortical infarction. No evidence of intracranial mass. No midline shift or extra-axial fluid collection. Minimal scattered ill-defined hypoattenuation within the cerebral white matter is nonspecific, but consistent with chronic small vessel ischemic disease. Mild generalized parenchymal atrophy. Vascular: No hyperdense vessel.  Atherosclerotic calcifications. Skull: Normal. Negative for fracture or focal lesion. Sinuses/Orbits: Visualized orbits demonstrate no acute abnormality. Minimal right sphenoid sinus mucosal thickening. No significant mastoid effusion. IMPRESSION: 1. No CT evidence of acute intracranial abnormality. 2. Mild generalized parenchymal atrophy with minimal chronic small vessel ischemic disease. Electronically Signed   By: Jackey Loge DO   On: 07/18/2019 20:10   Mr Brain Wo Contrast  Result Date: 07/19/2019 CLINICAL DATA:  Stroke follow-up. Encephalopathy. EXAM: MRI HEAD WITHOUT CONTRAST TECHNIQUE: Multiplanar, multiecho pulse sequences of the brain and surrounding structures were obtained without intravenous contrast. COMPARISON:  Head CT 07/18/2019 FINDINGS: BRAIN: There  is no acute infarct, acute hemorrhage or extra-axial collection. Multifocal white matter hyperintensity, most commonly due to chronic ischemic microangiopathy. There is generalized atrophy without lobar predilection. The midline structures are normal. 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 VASCULAR: The major intracranial arterial and venous sinus flow voids are normal. Susceptibility-sensitive sequences show no chronic microhemorrhage or superficial siderosis. SKULL AND UPPER CERVICAL SPINE: Calvarial bone marrow signal is normal. There is no skull base mass. The visualized upper cervical spine and soft tissues are normal. SINUSES/ORBITS: There are no fluid levels or advanced mucosal thickening. The mastoid  air cells and middle ear cavities are free of fluid. The orbits are normal. IMPRESSION: 1. No acute intracranial abnormality. 2. 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 Electronically Signed   By: Deatra Robinson M.D.   On: 07/19/2019 01:51   US Carotid Bilateral  Result Date: 07/19/2019 CLINICAL DATA:  78 year old male with symptoms of transient ischemic attack EXAM: BILATERAL CAROTID DUPLEX ULTRASOUND TECHNIQUE: Wallace Cullens scale imaging, color Doppler and duplex ultrasound were performed of bilateral carotid and vertebral arteries in the neck. COMPARISON:  None. FINDINGS: Criteria: Quantification of carotid stenosis is based on velocity parameters that correlate the residual internal carotid diameter with NASCET-based stenosis levels, using the diameter of the distal internal carotid lumen as the denominator for stenosis measurement. The following velocity measurements were obtained: RIGHT ICA: 92/13 cm/sec CCA: 70/6 cm/sec SYSTOLIC ICA/CCA RATIO:  1.3 ECA:  95 cm/sec LEFT ICA: 94/18 cm/sec CCA: 95/8 cm/sec SYSTOLIC ICA/CCA RATIO:  1.0 ECA:  113 cm/sec RIGHT CAROTID ARTERY: Mild focal heterogeneous atherosclerotic plaque in the  proximal internal carotid artery. By peak systolic velocity criteria, the estimated stenosis is less than 50%. RIGHT VERTEBRAL ARTERY:  Patent with normal antegrade flow. LEFT CAROTID ARTERY: Mild focal heterogeneous atherosclerotic plaque in the proximal internal carotid artery. By peak systolic velocity criteria, the estimated stenosis is less than 50%. LEFT VERTEBRAL ARTERY:  Patent with normal antegrade flow. IMPRESSION: 1. 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 2. Mild (1-49%) stenosis proximal left internal carotid artery secondary to heterogenous atherosclerotic plaque. 3. Vertebral arteries are patent with normal antegrade flow. Signed, Sterling Big, MD, RPVI [facility] Electronically Signed   By: Malachy Moan M.D.   On: 07/19/2019 09:48    EKG:   Orders placed or performed during the hospital encounter of 07/18/19  . EKG      Management plans discussed with the patient, family and they are in agreement.  CODE STATUS:     Code Status Orders  (From admission, onward)         Start     Ordered   07/18/19 2356  Full code  Continuous     07/18/19 2359        Code Status History    This patient has a current code status but no historical code status.   Advance Care Planning Activity    Advance Directive Documentation     Most Recent Value  Type of Advance Directive  Living will  Pre-existing out of facility DNR order (yellow form or pink MOST form)  -  "MOST" Form in Place?  -      TOTAL TIME TAKING CARE OF THIS PATIENT: 45  minutes.   Note: This dictation was prepared with Dragon dictation along with smaller phrase technology. Any transcriptional errors that result from this process are unintentional.   @  on 07/20/2019 at 1:23 PM  Between 7am to 6pm - Pager - [phone]  After 6pm go to [email or web address] - password [email or web address]  [facility]  [place] [facility]  Office  [phone]  CC: Primary care physician; Hoover Browns, MD

## 2019-07-20 NOTE — Discharge Instructions (Signed)
Follow-up with primary care physician in 3 days Follow-up with [facility] cardiology in 1 week Follow-up with neurology-Dr. Melrose Nakayama in 2 weeks

## 2019-07-20 NOTE — Progress Notes (Signed)
Discharge instructions explained to pt and pts wife/ verbalized an understanding/ iv and tele removed/ transported off unit via wheelchair.  

## 2019-07-26 ENCOUNTER — Encounter: Payer: Medicare Other | Attending: Physician Assistant | Admitting: Physician Assistant

## 2019-07-26 ENCOUNTER — Other Ambulatory Visit: Payer: Self-pay

## 2019-07-26 DIAGNOSIS — I10 Essential (primary) hypertension: Secondary | ICD-10-CM | POA: Insufficient documentation

## 2019-07-26 DIAGNOSIS — E1140 Type 2 diabetes mellitus with diabetic neuropathy, unspecified: Secondary | ICD-10-CM | POA: Diagnosis not present

## 2019-07-26 DIAGNOSIS — L97412 Non-pressure chronic ulcer of right heel and midfoot with fat layer exposed: Secondary | ICD-10-CM | POA: Diagnosis not present

## 2019-07-26 DIAGNOSIS — E11622 Type 2 diabetes mellitus with other skin ulcer: Secondary | ICD-10-CM | POA: Insufficient documentation

## 2019-07-26 NOTE — Progress Notes (Signed)
Chris [name], Chris [name] ([id number]) Visit Report for 07/26/2019 Arrival Information Details Patient Name: Chris [name], Chris [name] Date of Service: 07/26/2019 9:00 AM Medical Record Number: [id number] Patient Account Number: [id number] Date of Birth/Sex: 1941-05-06 (77 y.o. M) Treating RN: Montey Hora Primary Care Blessin Kanno: Peri Jefferson Other Clinician: Referring Emmanuela Ghazi: Peri Jefferson Treating Naiyah Klostermann/Extender: Melburn Hake, HOYT Weeks in Treatment: 3 Visit Information History Since Last Visit Added or deleted any medications: No Patient Arrived: Cane Any new allergies or adverse reactions: No Arrival Time: 09:07 Had a fall or experienced change in No Accompanied By: self activities of daily living that may affect Transfer Assistance: None risk of falls: Patient Identification Verified: Yes Signs or symptoms of abuse/neglect since last visito No Secondary Verification Process Yes Hospitalized since last visit: No Completed: Implantable device outside of the clinic excluding No Patient Has Alerts: Yes cellular tissue based products placed in the center Patient Alerts: Patient on Blood since last visit: Thinner Has Dressing in Place as Prescribed: Yes Pain Present Now: Yes Electronic Signature(s) Signed: 07/26/2019 3:46:08 PM By: Montey Hora Entered By: Montey Hora on 07/26/2019 09:08:16 Chris [name] ([id number]) -------------------------------------------------------------------------------- Clinic Level of Care Assessment Details Patient Name: Chris [name] Date of Service: 07/26/2019 9:00 AM Medical Record Number: [id number] Patient Account Number: [id number] Date of Birth/Sex: [birth year] (77 y.o. M) Treating RN: Harold Barban Primary Care Alexei Doswell: Peri Jefferson Other Clinician: Referring Esmerelda Finnigan: Peri Jefferson Treating Corinthian Kemler/Extender: Melburn Hake, HOYT Weeks in Treatment: 3 Clinic Level of Care Assessment Items TOOL 4 Quantity Score []  - Use when only an EandM is  performed on FOLLOW-UP visit 0 ASSESSMENTS - Nursing Assessment / Reassessment X - Reassessment of Co-morbidities (includes updates in patient status) 1 10 X- 1 5 Reassessment of Adherence to Treatment Plan ASSESSMENTS - Wound and Skin Assessment / Reassessment X - Simple Wound Assessment / Reassessment - one wound 1 5 []  - 0 Complex Wound Assessment / Reassessment - multiple wounds []  - 0 Dermatologic / Skin Assessment (not related to wound area) ASSESSMENTS - Focused Assessment []  - Circumferential Edema Measurements - multi extremities 0 []  - 0 Nutritional Assessment / Counseling / Intervention []  - 0 Lower Extremity Assessment (monofilament, tuning fork, pulses) []  - 0 Peripheral Arterial Disease Assessment (using hand held doppler) ASSESSMENTS - Ostomy and/or Continence Assessment and Care []  - Incontinence Assessment and Management 0 []  - 0 Ostomy Care Assessment and Management (repouching, etc.) PROCESS - Coordination of Care X - Simple Patient / Family Education for ongoing care 1 15 []  - 0 Complex (extensive) Patient / Family Education for ongoing care []  - 0 Staff obtains Programmer, systems, Records, Test Results / Process Orders []  - 0 Staff telephones HHA, Nursing Homes / Clarify orders / etc []  - 0 Routine Transfer to another Facility (non-emergent condition) []  - 0 Routine Hospital Admission (non-emergent condition) []  - 0 New Admissions / Biomedical engineer / Ordering NPWT, Apligraf, etc. []  - 0 Emergency Hospital Admission (emergent condition) X- 1 10 Simple Discharge Coordination CYNCERE, SONTAG ([id number]) []  - 0 Complex (extensive) Discharge Coordination PROCESS - Special Needs []  - Pediatric / Minor Patient Management 0 []  - 0 Isolation Patient Management []  - 0 Hearing / Language / Visual special needs []  - 0 Assessment of Community assistance (transportation, D/C planning, etc.) []  - 0 Additional assistance / Altered mentation []  - 0 Support  Surface(s) Assessment (bed, cushion, seat, etc.) INTERVENTIONS - Wound Cleansing / Measurement X - Simple Wound Cleansing - one wound 1 5 []  - 0 Complex Wound Cleansing - multiple wounds  X- 1 5 Wound Imaging (photographs - any number of wounds) []  - 0 Wound Tracing (instead of photographs) X- 1 5 Simple Wound Measurement - one wound []  - 0 Complex Wound Measurement - multiple wounds INTERVENTIONS - Wound Dressings X - Small Wound Dressing one or multiple wounds 1 10 []  - 0 Medium Wound Dressing one or multiple wounds []  - 0 Large Wound Dressing one or multiple wounds []  - 0 Application of Medications - topical []  - 0 Application of Medications - injection INTERVENTIONS - Miscellaneous []  - External ear exam 0 []  - 0 Specimen Collection (cultures, biopsies, blood, body fluids, etc.) []  - 0 Specimen(s) / Culture(s) sent or taken to Lab for analysis []  - 0 Patient Transfer (multiple staff / / Similar devices) []  - 0 Simple Staple / Suture removal (25 or less) []  - 0 Complex Staple / Suture removal (26 or more) []  - 0 Hypo / Hyperglycemic Management (close monitor of Blood Glucose) []  - 0 Ankle / Brachial Index (ABI) - do not check if billed separately X- 1 5 Vital Signs Carley, Ifeanyichukwu ( ) Has the patient been seen at the hospital within the last three years: Yes Total Score: 75 Level Of Care: New/Established - Level 2 Electronic Signature(s) Signed: 07/26/2019 4:25:43 PM By: Entered By: on 07/26/2019 09:24:25 ( ) -------------------------------------------------------------------------------- Encounter Discharge Information Details Patient Name: Date of Service: 07/26/2019 9:00 AM Medical Record Number: Nurse, adult Patient Account Number: Date of Birth/Sex: 1941-09-11 (77 y.o. M) Treating RN: Primary Care Dakiya Puopolo: [phone] Other Clinician: Referring  Rae Sutcliffe: 13/10/2018 Treating Alvenia Treese/Extender: Arnette Norris, HOYT Weeks in Treatment: 3 Encounter Discharge Information Items Discharge Condition: Stable Ambulatory Status: Cane Discharge Destination: Home Transportation: Private Auto Accompanied By: self Schedule Follow-up Appointment: Yes Clinical Summary of Care: Electronic Signature(s) Signed: 07/26/2019 4:25:43 PM By: 13/10/2018 Entered By: Chris [name] on 07/26/2019 09:28:30 Chris [name] (13/10/2018) -------------------------------------------------------------------------------- Lower Extremity Assessment Details Patient Name: [id number] Date of Service: 07/26/2019 9:00 AM Medical Record Number: 09/08/1941 Patient Account Number: [id number] Date of Birth/Sex: 27-Jun-1941 (77 y.o. M) Treating RN: Lenda Kelp Primary Care Kaven Cumbie: Linwood Dibbles Other Clinician: Referring Moshe Wenger: 13/10/2018 Treating Baeleigh Devincent/Extender: STONE III, HOYT Weeks in Treatment: 3 Edema Assessment Assessed: [Left: No] [Right: No] Edema: [Left: N] [Right: o] Vascular Assessment Pulses: Dorsalis Pedis Palpable: [Right:Yes] Electronic Signature(s) Signed: 07/26/2019 3:46:08 PM By: Arnette Norris Entered By: 13/10/2018 on 07/26/2019 09:16:34 [id number] (Chris [name]) -------------------------------------------------------------------------------- Multi Wound Chart Details Patient Name: 13/10/2018 Date of Service: 07/26/2019 9:00 AM Medical Record Number: [id number] Patient Account Number: 09/08/1941 Date of Birth/Sex: 19-May-1941 (77 y.o. M) Treating RN: Lenda Kelp Primary Care Robt Okuda: Lenda Kelp Other Clinician: Referring Jara Feider: 13/10/2018 Treating Kimya Mccahill/Extender: Curtis Sites, HOYT Weeks in Treatment: 3 Vital Signs Height(in): 73 Pulse(bpm): 79 Weight(lbs): 250 Blood Pressure(mmHg): 119/49 Body Mass Index(BMI): 33 Temperature(F): 97.7 Respiratory Rate 18 (breaths/min): Photos:  [N/A:N/A] Wound Location: Right Calcaneus N/A N/A Wounding Event: Gradually Appeared N/A N/A Primary Etiology: Diabetic Wound/Ulcer of the N/A N/A Lower Extremity Comorbid History: Cataracts, Coronary Artery N/A N/A Disease, Hypertension, Type II Diabetes, Neuropathy Date Acquired: 04/05/2019 N/A N/A Weeks of Treatment: 3 N/A N/A Wound Status: Open N/A N/A Measurements L x W x D 0.5x0.4x0.1 N/A N/A (cm) Area (cm) : 0.157 N/A N/A Volume (cm) : 0.016 N/A N/A % Reduction in Area: 66.70% N/A N/A % Reduction in Volume: 66.00% N/A N/A Classification: Grade 1 N/A N/A Exudate Amount: Medium  N/A N/A Exudate Type: Serosanguineous N/A N/A Exudate Color: red, brown N/A N/A Wound Margin: Flat and Intact N/A N/A Granulation Amount: Large (67-100%) N/A N/A Granulation Quality: Pink N/A N/A Necrotic Amount: Small (1-33%) N/A N/A Exposed Structures: Fat Layer (Subcutaneous N/A N/A Tissue) Exposed: Yes Fascia: No Tendon: No Muscle: No Chris [name], Chris [name] ([id number]) Joint: No Bone: No Epithelialization: Medium (34-66%) N/A N/A Treatment Notes Electronic Signature(s) Signed: 07/26/2019 4:25:43 PM By: Arnette Norris Entered By: Arnette Norris on 07/26/2019 09:21:34 Chris [name] ([id number]) -------------------------------------------------------------------------------- Multi-Disciplinary Care Plan Details Patient Name: Chris [name] Date of Service: 07/26/2019 9:00 AM Medical Record Number: [id number] Patient Account Number: [id number] Date of Birth/Sex: 1941/07/07 (77 y.o. M) Treating RN: Arnette Norris Primary Care Jisel Fleet: Lenda Kelp Other Clinician: Referring Kaycie Pegues: Lenda Kelp Treating Maryfrances Portugal/Extender: Linwood Dibbles, HOYT Weeks in Treatment: 3 Active Inactive Necrotic Tissue Nursing Diagnoses: Knowledge deficit related to management of necrotic/devitalized tissue Goals: Necrotic/devitalized tissue will be minimized in the wound bed Date Initiated: 07/02/2019 Target  Resolution Date: 10/02/2019 Goal Status: Active Interventions: Provide education on necrotic tissue and debridement process Notes: Orientation to the Wound Care Program Nursing Diagnoses: Knowledge deficit related to the wound healing center program Goals: Patient/caregiver will verbalize understanding of the Wound Healing Center Program Date Initiated: 07/02/2019 Target Resolution Date: 10/02/2019 Goal Status: Active Interventions: Provide education on orientation to the wound center Notes: Peripheral Neuropathy Nursing Diagnoses: Potential alteration in peripheral tissue perfusion (select prior to confirmation of diagnosis) Goals: Patient/caregiver will verbalize understanding of disease process and disease management Date Initiated: 07/02/2019 Target Resolution Date: 10/02/2019 Goal Status: Active Interventions: Assess signs and symptoms of neuropathy upon admission and as needed Chris [name], Chris [name] ([id number]) Notes: Wound/Skin Impairment Nursing Diagnoses: Impaired tissue integrity Goals: Ulcer/skin breakdown will heal within 14 weeks Date Initiated: 07/02/2019 Target Resolution Date: 10/02/2019 Goal Status: Active Interventions: Assess patient/caregiver ability to obtain necessary supplies Assess patient/caregiver ability to perform ulcer/skin care regimen upon admission and as needed Assess ulceration(s) every visit Notes: Electronic Signature(s) Signed: 07/26/2019 4:25:43 PM By: Arnette Norris Entered By: Arnette Norris on 07/26/2019 09:21:23 Chris [name] ([id number]) -------------------------------------------------------------------------------- Pain Assessment Details Patient Name: Chris [name] Date of Service: 07/26/2019 9:00 AM Medical Record Number: [id number] Patient Account Number: [id number] Date of Birth/Sex: 28-Feb-1941 (77 y.o. M) Treating RN: Curtis Sites Primary Care Lachele Lievanos: Lenda Kelp Other Clinician: Referring Reshad Saab: Lenda Kelp Treating  Sharnelle Cappelli/Extender: Linwood Dibbles, HOYT Weeks in Treatment: 3 Active Problems Location of Pain Severity and Description of Pain Patient Has Paino Yes Site Locations Pain Location: Generalized Pain Pain Management and Medication Current Pain Management: Electronic Signature(s) Signed: 07/26/2019 3:46:08 PM By: Curtis Sites Entered By: Curtis Sites on 07/26/2019 09:08:32 Chris [name] ([id number]) -------------------------------------------------------------------------------- Patient/Caregiver Education Details Patient Name: Chris [name] Date of Service: 07/26/2019 9:00 AM Medical Record Number: [id number] Patient Account Number: [id number] Date of Birth/Gender: Mar 15, 1941 (77 y.o. M) Treating RN: Arnette Norris Primary Care Physician: Lenda Kelp Other Clinician: Referring Physician: Lenda Kelp Treating Physician/Extender: Skeet Simmer in Treatment: 3 Education Assessment Education Provided To: Patient Education Topics Provided Wound/Skin Impairment: Handouts: Caring for Your Ulcer Methods: Demonstration, Explain/Verbal Responses: State content correctly Electronic Signature(s) Signed: 07/26/2019 4:25:43 PM By: Arnette Norris Entered By: Arnette Norris on 07/26/2019 09:21:51 Chris [name] ([id number]) -------------------------------------------------------------------------------- Wound Assessment Details Patient Name: Chris [name] Date of Service: 07/26/2019 9:00 AM Medical Record Number: [id number] Patient Account Number: [id number] Date of Birth/Sex: August 10, 1941 (77 y.o. M) Treating RN: Curtis Sites Primary Care Taquilla Downum: Lenda Kelp Other Clinician: Referring Glen Kesinger: Lenda Kelp Treating Jaishon Krisher/Extender: Linwood Dibbles, HOYT  Weeks in Treatment: 3 Wound Status Wound Number: 1 Primary Diabetic Wound/Ulcer of the Lower Extremity Etiology: Wound Location: Right Calcaneus Wound Open Wounding Event: Gradually Appeared Status: Date Acquired:  04/05/2019 Comorbid Cataracts, Coronary Artery Disease, Weeks Of Treatment: 3 History: Hypertension, Type II Diabetes, Neuropathy Clustered Wound: No Photos Wound Measurements Length: (cm) 0.5 % Reduction in Width: (cm) 0.4 % Reduction in Depth: (cm) 0.1 Epithelializati Area: (cm) 0.157 Tunneling: Volume: (cm) 0.016 Undermining: Area: 66.7% Volume: 66% on: Medium (34-66%) No No Wound Description Classification: Grade 1 Foul Odor After Wound Margin: Flat and Intact Slough/Fibrino Exudate Amount: Medium Exudate Type: Serosanguineous Exudate Color: red, brown Cleansing: No Yes Wound Bed Granulation Amount: Large (67-100%) Exposed Structure Granulation Quality: Pink Fascia Exposed: No Necrotic Amount: Small (1-33%) Fat Layer (Subcutaneous Tissue) Exposed: Yes Necrotic Quality: Adherent Slough Tendon Exposed: No Muscle Exposed: No Joint Exposed: No Bone Exposed: No Treatment Notes Chris ClearRYAN, Chris [name] ([id number]) Wound #1 (Right Calcaneus) Notes Hydrofera blue and [place]elfa [facility] Electronic Signature(s) Signed: 07/26/2019 3:46:08 PM By: Curtis Sitesorthy, Joanna Entered By: Curtis Sitesorthy, Joanna on 07/26/2019 09:15:05 Chris [name], Star ([id number]) -------------------------------------------------------------------------------- Vitals Details Patient Name: Chris [name], Chris [name] Date of Service: 07/26/2019 9:00 AM Medical Record Number: [id number] Patient Account Number: [id number] Date of Birth/Sex: 08/02/1941 (77 y.o. M) Treating RN: Curtis Sitesorthy, Joanna Primary Care Neo Yepiz: Lenda KelpPATEL, BANSARI Other Clinician: Referring Rutha Melgoza: Lenda KelpPATEL, BANSARI Treating Yazhini Mcaulay/Extender: Linwood DibblesSTONE III, HOYT Weeks in Treatment: 3 Vital Signs Time Taken: 09:08 Temperature (F): 97.7 Height (in): 73 Pulse (bpm): 79 Weight (lbs): 250 Respiratory Rate (breaths/min): 18 Body Mass Index (BMI): 33 Blood Pressure (mmHg): 119/49 Reference Range: 80 - 120 mg / dl Electronic Signature(s) Signed: 07/26/2019 3:46:08 PM By: Curtis Sitesorthy,  Joanna Entered By: Curtis Sitesorthy, Joanna on 07/26/2019 09:09:36

## 2019-07-26 NOTE — Progress Notes (Addendum)
Chris Chris [name], Chris [name] ([id number]) Visit Report for 07/26/2019 Chief Complaint Document Details Patient Name: Chris Chris [name] Date of Service: 07/26/2019 9:00 AM Medical Record Number: [id number] Patient Account Number: [id number] Date of Birth/Sex: Mar 10, 1941 (78 y.o. M) Treating RN: Arnette Norris Primary Care Provider: Lenda Kelp Other Clinician: Referring Provider: Lenda Kelp Treating Provider/Extender: Linwood Dibbles, HOYT Weeks in Treatment: 3 Information Obtained from: Patient Chief Complaint Right heel ulcer Electronic Signature(s) Signed: 07/26/2019 9:19:29 AM By: Lenda Kelp PA-C Entered By: Lenda Kelp on 07/26/2019 09:19:29 Chris Chris [name] ([id number]) -------------------------------------------------------------------------------- HPI Details Patient Name: Chris Chris [name] Date of Service: 07/26/2019 9:00 AM Medical Record Number: [id number] Patient Account Number: [id number] Date of Birth/Sex: [birth year] (78 y.o. M) Treating RN: Arnette Norris Primary Care Provider: Lenda Kelp Other Clinician: Referring Provider: Lenda Kelp Treating Provider/Extender: Linwood Dibbles, HOYT Weeks in Treatment: 3 History of Present Illness HPI Description: 07/02/2019 upon evaluation today patient presents for initial inspection concerning a wound that he has on the posterior aspect of his right heel. This has been present he tells me since earlier summer 2020. He does have a history of diabetes mellitus type 2 as well as hypertension. He has been seeing podiatry and was subsequently referred from podiatry to [place] for further evaluation and treatment due to the nonhealing wound. He states he is also been having some unfortunate pain although I believe this may be more of a nerve pain than anything else. There is no signs of infection and overall I am actually somewhat pleased with the appearance of what I see although there is some slough noted on the surface of the wound I think there may be  some things we can do to speed along his recovery. No fevers, chills, nausea, vomiting, or diarrhea. The patient's blood sugar is under fairly good control with a hemoglobin A1c of 7.5 that was taken last on September 3. He was given Keflex as well although he is done with that. It did not seem to change much 1 way or another. 07/16/2019 on evaluation today patient actually appears to be doing much better with regard to his lower extremity wound on the heel. This is on the right. He has been tolerating the dressing changes which is the [facility] without complication and this seems to have done a great job for him. There is no signs of active infection at this time. No fevers, chills, nausea, vomiting, or diarrhea. 07/26/2019 upon evaluation today patient appears to be doing well today with regard to his right heel. He has been tolerating the dressing changes without complication and the good news is there is no signs of active infection at this time. With that being said he still is very touchy at this site I think this is more neuropathy than anything. With that being said I do believe that the patient is benefiting from the collagen dressing. Electronic Signature(s) Signed: 07/26/2019 3:36:14 PM By: Lenda Kelp PA-C Entered By: Lenda Kelp on 07/26/2019 15:36:13 Chris Chris [name] ([id number]) -------------------------------------------------------------------------------- Physical Exam Details Patient Name: Chris Chris [name] Date of Service: 07/26/2019 9:00 AM Medical Record Number: [id number] Patient Account Number: [id number] Date of Birth/Sex: 1940-12-17 (78 y.o. M) Treating RN: Arnette Norris Primary Care Provider: Lenda Kelp Other Clinician: Referring Provider: Lenda Kelp Treating Provider/Extender: STONE III, HOYT Weeks in Treatment: 3 Constitutional Well-nourished and well-hydrated in no acute distress. Respiratory normal breathing without difficulty. Chris [name] to  auscultation bilaterally. Cardiovascular regular rate and rhythm with normal S1, S2. Psychiatric this patient is able to make decisions  and demonstrates good insight into disease process. Alert and Oriented x 3. pleasant and cooperative. Notes Patient's wound bed currently did not require any sharp debridement he has good granulation in the base of the wound and fortunately no signs of active infection at this time. He is somewhat touchy as far as the overall pain in the heel is concerned but again this does not appear to be showing any evidence of infection in my opinion. Electronic Signature(s) Signed: 07/26/2019 3:36:52 PM By: Worthy Keeler PA-C Entered By: Worthy Keeler on 07/26/2019 15:36:51 Chris Chris [name] ([id number]) -------------------------------------------------------------------------------- Physician Orders Details Patient Name: Chris Chris [name] Date of Service: 07/26/2019 9:00 AM Medical Record Number: [id number] Patient Account Number: [id number] Date of Birth/Sex: 1940/12/23 (78 y.o. 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 Primary Wound Dressing Wound #1 Right Calcaneus o Hydrafera Blue Ready Transfer Secondary Dressing Wound #1 Right Calcaneus o [place] Dressing Change Frequency Wound #1 Right Calcaneus o Change Dressing Monday, Wednesday, Friday Follow-up  Appointments Wound #1 Right Calcaneus o Return Appointment in 1 week. Off-Loading Wound #1 Right Calcaneus o Other: - You can purchase a heel offloading shoe Electronic Signature(s) Signed: 07/26/2019 4:25:43 PM By: Harold Barban Signed: 07/28/2019 2:09:51 AM By: Worthy Keeler PA-C Entered By: Harold Barban on 07/26/2019 09:26:26 Chris Chris [name] ([id number]) -------------------------------------------------------------------------------- Problem List Details Patient Name: Chris Chris [name] Date of Service: 07/26/2019 9:00 AM Medical Record Number: [id number] Patient Account Number: [id number] Date of Birth/Sex: [birth year] (78 y.o. M) Treating RN: Harold Barban Primary Care Provider: Peri Chris Other Clinician: Referring Provider: Peri Chris Treating Provider/Extender: Melburn Hake, HOYT Weeks in Treatment: 3 Active Problems ICD-10 Evaluated Encounter Code Description Active Date Today Diagnosis E11.622 Type 2 diabetes mellitus with other skin ulcer 07/02/2019 No Yes 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 07/02/2019 No Yes layer exposed [place] (primary) hypertension 07/02/2019 No Yes Inactive Problems Resolved Problems Electronic Signature(s) Signed: 07/26/2019 9:19:23 AM By: Worthy Keeler PA-C Entered By: Worthy Keeler on 07/26/2019 09:19:22 Chris Chris [name] ([id number]) -------------------------------------------------------------------------------- Progress Note Details Patient Name: Chris Chris [name] Date of Service: 07/26/2019 9:00 AM Medical Record Number: [id number] Patient Account Number: [id number] Date of Birth/Sex: Dec 07, 1940 (78 y.o. M) Treating RN: Harold Barban Primary Care Provider: Peri Chris Other Clinician: Referring Provider: Peri Chris Treating Provider/Extender: Melburn Hake, HOYT Weeks in Treatment: 3 Subjective Chief Complaint Information obtained from Patient Right heel ulcer History of Present Illness  (HPI) 07/02/2019 upon evaluation today patient presents for initial inspection concerning a wound that he has on the posterior aspect of his right heel. This has been present he tells me since earlier summer 2020. He does have a history of diabetes mellitus type 2 as well as hypertension. He has been seeing podiatry and was subsequently referred from podiatry to [place] for further evaluation and treatment due to the nonhealing wound. He states he is also been having some unfortunate pain although I believe this may be more of a nerve pain than anything else.  There is no signs of infection and overall I am actually somewhat pleased with the appearance of what I see although there is some slough noted on the surface of the wound I think there may be some things we can do to speed along his recovery. No fevers, chills, nausea, vomiting, or diarrhea. The patient's blood sugar is under fairly good control with a hemoglobin A1c of 7.5 that was taken last on September 3. He was given Keflex as well although he is done with that. It did not seem to change much 1 way or another. 07/16/2019 on evaluation today patient actually appears to be doing much better with regard to his lower extremity wound on the heel. This is on the right. He has been tolerating the dressing changes which is the [facility] without complication and this seems to have done a great job for him. There is no signs of active infection at this time. No fevers, chills, nausea, vomiting, or diarrhea. 07/26/2019 upon evaluation today patient appears to be doing well today with regard to his right heel. He has been tolerating the dressing changes without complication and the good news is there is no signs of active infection at this time. With that being said he still is very touchy at this site I think this is more neuropathy than anything. With that being said I do believe that the patient is benefiting from the collagen dressing. Patient  History Information obtained from Patient. Family History Diabetes - Mother,Maternal Grandparents, No family history of Cancer, Heart Disease, Hereditary Spherocytosis, Hypertension, Kidney Disease, Lung Disease, Seizures, Stroke, Thyroid Problems, Tuberculosis. Social History Never smoker, Marital Status - Married, Alcohol Use - Never, Drug Use - No History, Caffeine Use - Never. Medical History Eyes Patient has history of Cataracts Denies history of Glaucoma, Optic Neuritis Ear/Nose/Mouth/Throat Denies history of Chronic sinus problems/congestion, Middle ear problems Hematologic/Lymphatic Denies history of Anemia, Hemophilia, Human Immunodeficiency Virus, Lymphedema, Sickle Cell Disease Respiratory Chris, Chris [name] ([id number]) Denies history of Aspiration, Asthma, Chronic Obstructive Pulmonary Disease (COPD), Pneumothorax, Sleep Apnea, Tuberculosis Cardiovascular Patient has history of Coronary Artery Disease, Hypertension Denies history of Angina, Arrhythmia, Congestive Heart Failure, Deep Vein Thrombosis, Hypotension, Myocardial Infarction, Peripheral Arterial Disease, Peripheral Venous Disease, Phlebitis, Vasculitis Gastrointestinal Denies history of Cirrhosis , Colitis, Crohn s, Hepatitis A, Hepatitis B, Hepatitis C Endocrine Patient has history of Type II Diabetes Genitourinary Denies history of End Stage Renal Disease Immunological Denies history of Lupus Erythematosus, Raynaud s, Scleroderma Integumentary (Skin) Denies history of History of Burn, History of pressure wounds Musculoskeletal Denies history of Gout, Rheumatoid Arthritis, Osteoarthritis, Osteomyelitis Neurologic Patient has history of Neuropathy Denies history of Dementia, Quadriplegia, Paraplegia, Seizure Disorder Oncologic Denies history of Received Chemotherapy, Received Radiation Psychiatric Denies history of Anorexia/bulimia, Confinement Anxiety Review of Systems (ROS) Constitutional Symptoms  (General Health) Denies complaints or symptoms of Fatigue, Fever, Chills, Marked Weight Change. Respiratory Denies complaints or symptoms of Chronic or frequent coughs, Shortness of Breath. Cardiovascular Denies complaints or symptoms of Chest pain, LE edema. Psychiatric Denies complaints or symptoms of Anxiety, Claustrophobia. Objective Constitutional Well-nourished and well-hydrated in no acute distress. Vitals Time Taken: 9:08 AM, Height: 73 in, Weight: 250 lbs, BMI: 33, Temperature: 97.7 F, Pulse: 79 bpm, Respiratory Rate: 18 breaths/min, Blood Pressure: 119/49 mmHg. Respiratory normal breathing without difficulty. Chris [name] to auscultation bilaterally. Cardiovascular regular rate and rhythm with normal S1, S2. Chris, Chris [name] ([id number]) Psychiatric this patient is able to make decisions and demonstrates good insight into disease process. Alert and Oriented  x 3. pleasant and cooperative. General Notes: Patient's wound bed currently did not require any sharp debridement he has good granulation in the base of the wound and fortunately no signs of active infection at this time. He is somewhat touchy as far as the overall pain in the heel is concerned but again this does not appear to be showing any evidence of infection in my opinion. Integumentary (Hair, Skin) Wound #1 status is Open. Original cause of wound was Gradually Appeared. The wound is located on the Right Calcaneus. The wound measures 0.5cm length x 0.4cm width x 0.1cm depth; 0.157cm^2 area and 0.016cm^3 volume. There is Fat Layer (Subcutaneous Tissue) Exposed exposed. There is no tunneling or undermining noted. There is a medium amount of serosanguineous drainage noted. The wound margin is flat and intact. There is large (67-100%) pink granulation within the wound bed. There is a small (1-33%) amount of necrotic tissue within the wound bed including Adherent Slough. Assessment Active Problems ICD-10 Type 2 diabetes mellitus  with other skin ulcer Non-pressure chronic ulcer of right heel and midfoot with fat layer exposed Essential (primary) hypertension Plan Wound Cleansing: Wound #1 Right Calcaneus: Dial antibacterial soap, wash wounds, rinse and pat dry prior to dressing wounds May Shower, gently pat wound dry prior to applying new dressing. Primary Wound Dressing: Wound #1 Right Calcaneus: Hydrafera Blue Ready Transfer Secondary Dressing: Wound #1 Right Calcaneus: [place] Dressing Change Frequency: Wound #1 Right Calcaneus: Change Dressing Monday, Wednesday, Friday Follow-up Appointments: Wound #1 Right Calcaneus: Return Appointment in 1 week. Off-Loading: Wound #1 Right Calcaneus: Other: - You can purchase a heel offloading shoe Chris, Chris [name] ([id number]) 1. My suggestion currently is good to be that we go ahead and continue with the [facility] dressing which seems to be beneficial at this point. 2. I am also going to suggest that we go ahead and continue with the [place] dressing to cover as that seems to be doing well for the patient at this point. 3. I do think keeping pressure off the area still good to be of utmost importance is doing a good job with this in my opinion. Unfortunately he was never able to get the heel offloading shoe. We will see patient back for reevaluation in 1 week here in the clinic. If anything worsens or changes patient will contact our office for additional recommendations. Electronic Signature(s) Signed: 07/26/2019 3:37:35 PM By: Lenda Kelp PA-C Entered By: Lenda Kelp on 07/26/2019 15:37:35 Chris Chris [name] ([id number]) -------------------------------------------------------------------------------- ROS/PFSH Details Patient Name: Chris Chris [name] Date of Service: 07/26/2019 9:00 AM Medical Record Number: [id number] Patient Account Number: [id number] Date of Birth/Sex: 1940-12-22 (78 y.o. M) Treating RN: Arnette Norris Primary Care Provider: Lenda Kelp Other Clinician: Referring Provider: Lenda Kelp Treating Provider/Extender: Linwood Dibbles, HOYT Weeks in Treatment: 3 Information Obtained From Patient Constitutional Symptoms (General Health) Complaints and Symptoms: Negative for: Fatigue; Fever; Chills; Marked Weight Change Respiratory Complaints and Symptoms: Negative for: Chronic or frequent coughs; Shortness of Breath Medical History: Negative for: Aspiration; Asthma; Chronic Obstructive Pulmonary Disease (COPD); Pneumothorax; Sleep Apnea; Tuberculosis Cardiovascular Complaints and Symptoms: Negative for: Chest pain; LE edema Medical History: Positive for: Coronary Artery Disease; Hypertension Negative for: Angina; Arrhythmia; Congestive Heart Failure; Deep Vein Thrombosis; Hypotension; Myocardial Infarction; Peripheral Arterial Disease; Peripheral Venous Disease; Phlebitis; Vasculitis Psychiatric Complaints and Symptoms: Negative for: Anxiety; Claustrophobia Medical History: Negative for: Anorexia/bulimia; Confinement Anxiety Eyes Medical History: Positive for: Cataracts Negative for: Glaucoma; Optic Neuritis Ear/Nose/Mouth/Throat Medical History: Negative for: Chronic sinus  problems/congestion; Middle ear problems Hematologic/Lymphatic Medical History: Negative for: Anemia; Hemophilia; Human Immunodeficiency Virus; Lymphedema; Sickle Cell Disease Chris ClearRYAN, Deacon ([id number]) Gastrointestinal Medical History: Negative for: Cirrhosis ; Colitis; Crohnos; Hepatitis A; Hepatitis B; Hepatitis C Endocrine Medical History: Positive for: Type II Diabetes Time with diabetes: 30 years Treated with: Insulin Blood sugar tested every day: Yes Tested : Genitourinary Medical History: Negative for: End Stage Renal Disease Immunological Medical History: Negative for: Lupus Erythematosus; Raynaudos; Scleroderma Integumentary (Skin) Medical History: Negative for: History of Burn; History of pressure  wounds Musculoskeletal Medical History: Negative for: Gout; Rheumatoid Arthritis; Osteoarthritis; Osteomyelitis Neurologic Medical History: Positive for: Neuropathy Negative for: Dementia; Quadriplegia; Paraplegia; Seizure Disorder Oncologic Medical History: Negative for: Received Chemotherapy; Received Radiation HBO Extended History Items Eyes: Cataracts Immunizations Pneumococcal Vaccine: Received Pneumococcal Vaccination: Yes Implantable Devices None Family and Social History Cancer: No; Diabetes: Yes - Mother,Maternal Grandparents; Heart Disease: No; Hereditary Spherocytosis: No; Hypertension: No; Kidney Disease: No; Lung Disease: No; Seizures: No; Stroke: No; Thyroid Problems: No; TuberculosisAudie Chris [name]: Chris, Chris [name] ([id number]) No; Never smoker; Marital Status - Married; Alcohol Use: Never; Drug Use: No History; Caffeine Use: Never; Financial Concerns: No; Food, Clothing or Shelter Needs: No; Support System Lacking: No; Transportation Concerns: No Physician Affirmation I have reviewed and agree with the above information. Electronic Signature(s) Signed: 07/26/2019 4:25:43 PM By: Arnette NorrisBiell, Kristina Signed: 07/28/2019 2:09:51 AM By: Lenda KelpStone III, Hoyt PA-C Entered By: Lenda KelpStone III, Hoyt on 07/26/2019 15:36:30 Chris ClearYAN, Chris [name] ([id number]) -------------------------------------------------------------------------------- SuperBill Details Patient Name: Chris ClearYAN, Chris [name] Date of Service: 07/26/2019 Medical Record Number: [id number] Patient Account Number: [id number] Date of Birth/Sex: 1940-10-22 (78 y.o. 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 I10 Essential (primary)  hypertension Facility Procedures CPT4 Code: [id number] Description: [id number] - WOUND CARE VISIT-LEV 2 EST PT Modifier: Quantity: 1 Physician Procedures CPT4 Code: [id number] Description: 99214 - WC PHYS LEVEL 4 - EST PT ICD-10 Diagnosis Description E11.622 Type 2 diabetes mellitus with other skin ulcer 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 I10 Essential (primary) hypertension Modifier: layer exposed Quantity: 1 Electronic Signature(s) Signed: 07/26/2019 3:37:49 PM By: Lenda KelpStone III, Hoyt PA-C Entered By: Lenda KelpStone III, Hoyt on 07/26/2019 15:37:48

## 2019-07-28 ENCOUNTER — Other Ambulatory Visit: Payer: Self-pay

## 2019-07-28 ENCOUNTER — Emergency Department: Payer: Medicare Other

## 2019-07-28 ENCOUNTER — Emergency Department
Admission: EM | Admit: 2019-07-28 | Discharge: 2019-07-28 | Disposition: A | Discharge status: Home / Self Care | Payer: Medicare Other | Attending: Emergency Medicine | Admitting: Emergency Medicine

## 2019-07-28 DIAGNOSIS — E039 Hypothyroidism, unspecified: Secondary | ICD-10-CM | POA: Diagnosis not present

## 2019-07-28 DIAGNOSIS — E119 Type 2 diabetes mellitus without complications: Secondary | ICD-10-CM | POA: Insufficient documentation

## 2019-07-28 DIAGNOSIS — I10 Essential (primary) hypertension: Secondary | ICD-10-CM | POA: Diagnosis not present

## 2019-07-28 DIAGNOSIS — R55 Syncope and collapse: Secondary | ICD-10-CM | POA: Diagnosis not present

## 2019-07-28 DIAGNOSIS — E860 Dehydration: Secondary | ICD-10-CM | POA: Insufficient documentation

## 2019-07-28 DIAGNOSIS — I259 Chronic ischemic heart disease, unspecified: Secondary | ICD-10-CM | POA: Diagnosis not present

## 2019-07-28 DIAGNOSIS — N179 Acute kidney failure, unspecified: Secondary | ICD-10-CM | POA: Insufficient documentation

## 2019-07-28 DIAGNOSIS — R531 Weakness: Secondary | ICD-10-CM | POA: Diagnosis present

## 2019-07-28 DIAGNOSIS — Z955 Presence of coronary angioplasty implant and graft: Secondary | ICD-10-CM | POA: Insufficient documentation

## 2019-07-28 DIAGNOSIS — Z794 Long term (current) use of insulin: Secondary | ICD-10-CM | POA: Insufficient documentation

## 2019-07-28 DIAGNOSIS — Z79899 Other long term (current) drug therapy: Secondary | ICD-10-CM | POA: Diagnosis not present

## 2019-07-28 DIAGNOSIS — Z7982 Long term (current) use of aspirin: Secondary | ICD-10-CM | POA: Diagnosis not present

## 2019-07-28 HISTORY — DX: Transient cerebral ischemic attack, unspecified: G45.9

## 2019-07-28 LAB — TROPONIN I (HIGH SENSITIVITY): Troponin I (High Sensitivity): 13 ng/L (ref <18)

## 2019-07-28 LAB — CBC WITH DIFFERENTIAL/PLATELET
Abs Immature Granulocytes: 0.02 10*3/uL (ref 0.00–0.07)
Basophils Absolute: 0 10*3/uL (ref 0.0–0.1)
Basophils Relative: 1 %
Eosinophils Absolute: 0.1 10*3/uL (ref 0.0–0.5)
Eosinophils Relative: 2 %
HCT: 38.1 % — ABNORMAL LOW (ref 39.0–52.0)
Hemoglobin: 12.6 g/dL — ABNORMAL LOW (ref 13.0–17.0)
Immature Granulocytes: 0 %
Lymphocytes Relative: 30 %
Lymphs Abs: 2.3 10*3/uL (ref 0.7–4.0)
MCH: 30 pg (ref 26.0–34.0)
MCHC: 33.1 g/dL (ref 30.0–36.0)
MCV: 90.7 fL (ref 80.0–100.0)
Monocytes Absolute: 1 10*3/uL (ref 0.1–1.0)
Monocytes Relative: 14 %
Neutro Abs: 4 10*3/uL (ref 1.7–7.7)
Neutrophils Relative %: 53 %
Platelets: 147 10*3/uL — ABNORMAL LOW (ref 150–400)
RBC: 4.2 MIL/uL — ABNORMAL LOW (ref 4.22–5.81)
RDW: 13 % (ref 11.5–15.5)
WBC: 7.5 10*3/uL (ref 4.0–10.5)
nRBC: 0 % (ref 0.0–0.2)

## 2019-07-28 LAB — BASIC METABOLIC PANEL
Anion gap: 9 (ref 5–15)
BUN: 32 mg/dL — ABNORMAL HIGH (ref 8–23)
CO2: 27 mmol/L (ref 22–32)
Calcium: 9 mg/dL (ref 8.9–10.3)
Chloride: 105 mmol/L (ref 98–111)
Creatinine, Ser: 1.88 mg/dL — ABNORMAL HIGH (ref 0.61–1.24)
GFR calc Af Amer: 39 mL/min — ABNORMAL LOW (ref >60)
GFR calc non Af Amer: 34 mL/min — ABNORMAL LOW (ref >60)
Glucose, Bld: 89 mg/dL (ref 70–99)
Potassium: 4.3 mmol/L (ref 3.5–5.1)
Sodium: 141 mmol/L (ref 135–145)

## 2019-07-28 MED ORDER — LACTATED RINGERS IV BOLUS
1000.0000 mL | Freq: Once | INTRAVENOUS | Status: AC
  Administered 2019-07-28: 1000 mL via INTRAVENOUS

## 2019-07-28 NOTE — ED Triage Notes (Signed)
Pt to the er for all over weakness. Pt tonight at 1830 felt dizzy and weak all over. Denies vision changes. Pt reports headache but denies being light headed. Denies chest pain, diff breathing, fever or cough. Pt seen at [facility] 10 days ago. Stayed 2 days to rule out stroke. Thought that it was TIA. Pt was diaphoretic when EMS arrived and was hypotensive at 79/42, heart rate 64. Blood glucose 139. Pt received 350 ml of fluid.

## 2019-07-28 NOTE — ED Provider Notes (Signed)
[facility] Emergency Department Provider Note   ____________________________________________   First MD Initiated Contact with Patient 07/28/19 2011     (approximate)  I have reviewed the triage vital signs and the nursing notes.   HISTORY  Chief Complaint Weakness    HPI Chris [name] is a 78 y.o. male with past medical history of CAD status post CABG, hypertension, diabetes who presents to the ED complaining of lightheadedness and weakness.  Patient reports that he initially walked out of the house to check the mail and when he got back, began to feel very weak and lightheaded.  This continued to worsen as he sat down for dinner and patient states lightheadedness progressed to the point that he felt like he might pass out.  He did not have any associated chest pain or shortness of breath, states he felt generally weak but did not have any focal numbness or weakness.  He reports having a very similar episode about 1 week ago, when he was admitted to the hospital for further evaluation.  He states that work-up was negative and they were not sure what caused his episode.  He has otherwise been feeling well recently with no fevers, cough, shortness of breath, dysuria, hematuria, abdominal pain, nausea, or vomiting.  Denies any sick contacts.        Past Medical History:  Diagnosis Date  . Coronary artery disease 2006   4-vessel CABG  . Hyperlipidemia   . Hypertension   . TIA (transient ischemic attack)   . Type 2 diabetes mellitus [facility])     Patient Active Problem List   Diagnosis Date Noted  . CVA (cerebral vascular accident) ([place]) 07/18/2019  . Ulcer of ankle ([place]) 04/26/2019  . Pain due to onychomycosis of toenails of both feet 03/15/2019  . Dysphagia 10/29/2016  . Impaired functional mobility, balance, gait, and endurance 10/29/2016  . Memory impairment 10/29/2016  . Right hip pain 10/29/2016  . Tremors of nervous system 10/29/2016  . Carpal  tunnel syndrome 08/07/2016  . Coronary artery disease involving coronary bypass graft of native heart without angina pectoris 07/18/2016  . Essential hypertension 07/18/2016  . HLD (hyperlipidemia) 07/18/2016  . Hypothyroidism (acquired) 07/18/2016  . Paresthesias 07/18/2016  . Risk for falls 07/18/2016  . Type 2 diabetes mellitus with diabetic neuropathy, without long-term current use of insulin ([place]) 07/18/2016  . Type 2 diabetes mellitus with pressure callus ([place]) 07/18/2016    Past Surgical History:  Procedure Laterality Date  . BYPASS GRAFT  2006   4-vessel  . TOE AMPUTATION Left     Prior to Admission medications   Medication Sig Start Date End Date Taking? Authorizing Provider  aspirin EC 81 MG tablet Take 81 mg by mouth daily.    [provider]  atorvastatin (LIPITOR) 40 MG tablet Take 40 mg by mouth daily at 6 PM.  08/11/18   [provider]  cetirizine (ZYRTEC) 10 MG tablet Take 10 mg by mouth daily.    [provider]  co-enzyme Q-10 30 MG capsule Take 30 mg by mouth daily.    [provider]  Cyanocobalamin (B-12) 2500 MCG TABS Take 1 tablet by mouth daily. 07/20/19   Gouru, Illene Silver, MD  furosemide (LASIX) 40 MG tablet TAKE 1 TABLET(40 MG) BY MOUTH EVERY DAY 09/22/18   [provider]  gabapentin (NEURONTIN) 300 MG capsule TAKE 4 CAPSULES(1200 MG) BY MOUTH TWICE DAILY 09/22/18   [provider]  insulin lispro protamine-lispro (HUMALOG 75/25  MIX) (75-25) 100 UNIT/ML SUSP injection Inject 30 Units into the skin 2 (two) times daily. 07/20/19   Ramonita Lab, MD  isosorbide mononitrate (IMDUR) 30 MG 24 hr tablet TK 1-2 TS PO D. TK 30 MG D. CAN INCREASE TO 60 MG D IF SYMPTOMS DO NOT RESOLVE 09/21/18   [provider]  levothyroxine (SYNTHROID, LEVOTHROID) 88 MCG tablet TAKE 1 TABLET BY MOUTH DAILY AT 6 AM 10/29/18   [provider]  losartan (COZAAR) 25 MG tablet Take 25 mg by mouth daily.     [provider]  Multiple Vitamins-Minerals (MULTIVITAMIN WITH MINERALS) tablet Take 1 tablet by mouth daily.     [provider]  omega-3 acid ethyl esters (LOVAZA) 1 g capsule Take 1 g by mouth 2 (two) times daily.    [provider]  pentoxifylline (TRENTAL) 400 MG CR tablet TAKE 1 TABLET(400 MG) BY MOUTH TWICE DAILY 07/29/18   [provider]    Allergies No known allergies  Family History  Problem Relation Age of Onset  . Seizures Brother   . Diabetes Mother   . Heart disease Sister     Social History Social History   Tobacco Use  . Smoking status: Never Smoker  . Smokeless tobacco: Never Used  Substance Use Topics  . Alcohol use: Not Currently    Comment: Two drinks/year  . Drug use: Never    Review of Systems  Constitutional: No fever/chills Eyes: No visual changes. ENT: No sore throat. Cardiovascular: Denies chest pain.  Positive for lightheadedness and near syncope. Respiratory: Denies shortness of breath. Gastrointestinal: No abdominal pain.  No nausea, no vomiting.  No diarrhea.  No constipation. Genitourinary: Negative for dysuria. Musculoskeletal: Negative for back pain. Skin: Negative for rash. Neurological: Positive for headaches, negative for focal weakness or numbness.  Positive for generalized weakness.  ____________________________________________   PHYSICAL EXAM:  VITAL SIGNS: ED Triage Vitals  Enc Vitals Group     BP      Pulse      Resp      Temp      Temp src      SpO2      Weight      Height      Head Circumference      Peak Flow      Pain Score      Pain Loc      Pain Edu?      Excl. in GC?     Constitutional: Alert and oriented. Eyes: Conjunctivae are normal.  Pupils equal round and reactive to light bilaterally, extraocular movements intact without nystagmus. Head: Atraumatic. Nose: No congestion/rhinnorhea. Mouth/Throat: Mucous membranes are moist. Neck: Normal ROM Cardiovascular: Normal rate,  regular rhythm. Grossly normal heart sounds. Respiratory: Normal respiratory effort.  No retractions. Lungs CTAB. Gastrointestinal: Soft and nontender. No distention. Genitourinary: deferred Musculoskeletal: No lower extremity tenderness nor edema. Neurologic:  Normal speech and language. No gross focal neurologic deficits are appreciated.  5 out of 5 strength in bilateral upper and lower extremities, no pronator drift.  Cranial nerves grossly intact, no facial droop. Skin:  Skin is warm, dry and intact. No rash noted. Psychiatric: Mood and affect are normal. Speech and behavior are normal.  ____________________________________________   LABS (all labs ordered are listed, but only abnormal results are displayed)  Labs Reviewed  BASIC METABOLIC PANEL - Abnormal; Notable for the following components:      Result Value   BUN 32 (*)  Creatinine, Ser 1.88 (*)    GFR calc non Af Amer 34 (*)    GFR calc Af Amer 39 (*)    All other components within normal limits  CBC WITH DIFFERENTIAL/PLATELET - Abnormal; Notable for the following components:   RBC 4.20 (*)    Hemoglobin 12.6 (*)    HCT 38.1 (*)    Platelets 147 (*)    All other components within normal limits  TROPONIN I (HIGH SENSITIVITY)  TROPONIN I (HIGH SENSITIVITY)   ____________________________________________  EKG  ED ECG REPORT I, Chesley Noonharles Niah Heinle, the attending physician, personally viewed and interpreted this ECG.   Date: 07/28/2019  EKG Time: 20:12  Rate: 57  Rhythm: sinus bradycardia  Axis: Normal  Intervals:none  ST&T Change: None   PROCEDURES  Procedure(s) performed (including Critical Care):  Procedures   ____________________________________________   INITIAL IMPRESSION / ASSESSMENT AND PLAN / ED COURSE       78 year old male with history of CAD status post CABG presents to the ED following episode of lightheadedness, generalized weakness, and near syncope.  EMS reports that he was very  diaphoretic during transport but he has now improved upon arrival to the ED.  EKG is without acute ischemic changes, does show some sinus bradycardia but doubt this would be the etiology of his symptoms.  Patient has no focal neurologic deficits on exam, doubt acute stroke and episode does not sound consistent with TIA.  Patient was admitted last week for very similar episode and work-up from that admission was reviewed.  At that time, he had negative CT head, MRI brain, echocardiogram, and EEG.  Will screen labs and chest x-ray today but his symptoms sound most consistent with vasovagal episode.  If labs are unremarkable today, do not feel readmission would be indicated given his recent negative work-up.  Labs are remarkable only for mild AKI, likely secondary to dehydration. Patient feels well after receiving fluid bolus and is asymptomatic at this time.  Suspect vasovagal episodes are contributing to his symptoms, will have him follow-up with cardiology and neurology as previously scheduled.  Counseled patient to return to the ED for new or worsening symptoms, patient and wife agree with plan.      ____________________________________________   FINAL CLINICAL IMPRESSION(S) / ED DIAGNOSES  Final diagnoses:  Near syncope  Vasovagal near-syncope  AKI (acute kidney injury) (HCC)  Dehydration     ED Discharge Orders    None       Note:  This document was prepared using Dragon voice recognition software and may include unintentional dictation errors.   Chesley NoonJessup, Minnetta Sandora, MD 07/28/19 [phone]

## 2019-08-02 ENCOUNTER — Ambulatory Visit: Payer: Medicare Other | Admitting: Physician Assistant

## 2019-08-06 ENCOUNTER — Other Ambulatory Visit: Payer: Self-pay

## 2019-08-06 ENCOUNTER — Encounter: Payer: Medicare Other | Admitting: Physician Assistant

## 2019-08-06 DIAGNOSIS — L97412 Non-pressure chronic ulcer of right heel and midfoot with fat layer exposed: Secondary | ICD-10-CM | POA: Diagnosis not present

## 2019-08-06 NOTE — Progress Notes (Addendum)
Chris [name], Chris [name] ([id number]) Visit Report for 08/06/2019 Chief Complaint Document Details Patient Name: Chris [name] Date of Service: 08/06/2019 9:15 AM Medical Record Number: [id number] Patient Account Number: [id number] Date of Birth/Sex: 12/13/1940 (78 y.o. M) Treating RN: Curtis Sites Primary Care Provider: Lenda Kelp Other Clinician: Referring Provider: Lenda Kelp Treating Provider/Extender: Linwood Dibbles, Caitlin Ainley Weeks in Treatment: 5 Information Obtained from: Patient Chief Complaint Right heel ulcer Electronic Signature(s) Signed: 08/06/2019 9:17:54 AM By: Lenda Kelp PA-C Entered By: Lenda Kelp on 08/06/2019 09:17:54 Chris [name] ([id number]) -------------------------------------------------------------------------------- Debridement Details Patient Name: Chris [name] Date of Service: 08/06/2019 9:15 AM Medical Record Number: [id number] Patient Account Number: [id number] Date of Birth/Sex: 04/22/1941 (78 y.o. M) Treating RN: Curtis Sites Primary Care Provider: Lenda Kelp Other Clinician: Referring Provider: Lenda Kelp Treating Provider/Extender: Linwood Dibbles, Haidan Nhan Weeks in Treatment: 5 Debridement Performed for Wound #1 Right Calcaneus Assessment: Performed By: Physician STONE III, Bayler Gehrig E., PA-C Debridement Type: Debridement Severity of Tissue Pre Fat layer exposed Debridement: Level of Consciousness (Pre- Awake and Alert procedure): Pre-procedure Verification/Time Yes - 09:31 Out Taken: Start Time: 09:31 Pain Control: Lidocaine 4% Topical Solution Total Area Debrided (L x W): 0.4 (cm) x 0.3 (cm) = 0.12 (cm) Tissue and other material Viable, Non-Viable, Slough, Subcutaneous, Skin: Dermis , Skin: Epidermis, Slough debrided: Level: Skin/Subcutaneous Tissue Debridement Description: Excisional Instrument: Curette Bleeding: Minimum Hemostasis Achieved: Pressure End Time: 09:34 Procedural Pain: 0 Post Procedural Pain: 0 Response to  Treatment: Procedure was tolerated well Level of Consciousness Awake and Alert (Post-procedure): Post Debridement Measurements of Total Wound Length: (cm) 0.5 Width: (cm) 0.4 Depth: (cm) 0.2 Volume: (cm) 0.031 Character of Wound/Ulcer Post Debridement: Improved Severity of Tissue Post Debridement: Fat layer exposed Post Procedure Diagnosis Same as Pre-procedure Electronic Signature(s) Signed: 08/06/2019 1:05:04 PM By: Curtis Sites Signed: 08/06/2019 3:30:31 PM By: Lenda Kelp PA-C Entered By: Curtis Sites on 08/06/2019 09:34:24 Chris [name] ([id number]) -------------------------------------------------------------------------------- HPI Details Patient Name: Chris [name] Date of Service: 08/06/2019 9:15 AM Medical Record Number: [id number] Patient Account Number: [id number] Date of Birth/Sex: 27-Feb-1941 (78 y.o. M) Treating RN: Curtis Sites Primary Care Provider: Lenda Kelp Other Clinician: Referring Provider: Lenda Kelp Treating Provider/Extender: Linwood Dibbles, Ann Bohne Weeks in Treatment: 5 History of Present Illness HPI Description: 07/02/2019 upon evaluation today patient presents for initial inspection concerning a wound that he has on the posterior aspect of his right heel. This has been present he tells me since earlier summer 2020. He does have a history of diabetes mellitus type 2 as well as hypertension. He has been seeing podiatry and was subsequently referred from podiatry to [place] for further evaluation and treatment due to the nonhealing wound. He states he is also been having some unfortunate pain although I believe this may be more of a nerve pain than anything else. There is no signs of infection and overall I am actually somewhat pleased with the appearance of what I see although there is some slough noted on the surface of the wound I think there may be some things we can do to speed along his recovery. No fevers, chills, nausea, vomiting, or diarrhea.  The patient's blood sugar is under fairly good control with a hemoglobin A1c of 7.5 that was taken last on September 3. He was given Keflex as well although he is done with that. It did not seem to change much 1 way or another. 07/16/2019 on evaluation today patient actually appears to be doing much better with regard to his lower extremity wound on  the heel. This is on the right. He has been tolerating the dressing changes which is the [facility] without complication and this seems to have done a great job for him. There is no signs of active infection at this time. No fevers, chills, nausea, vomiting, or diarrhea. 07/26/2019 upon evaluation today patient appears to be doing well today with regard to his right heel. He has been tolerating the dressing changes without complication and the good news is there is no signs of active infection at this time. With that being said he still is very touchy at this site I think this is more neuropathy than anything. With that being said I do believe that the patient is benefiting from the collagen dressing. 08/06/2019 on evaluation today patient actually appears to be doing better and is measuring smaller with regard to his heel ulcer. He has been tolerating the dressing changes without complication. Fortunately there is no signs of active infection at this time. No fevers, chills, nausea, vomiting, or diarrhea. He tells me he still has pain unfortunately but I believe this is nerve related pain in general. Even hurts around the wound on good skin and again that even with light touch. Electronic Signature(s) Signed: 08/06/2019 9:42:32 AM By: Lenda Kelp PA-C Entered By: Lenda Kelp on 08/06/2019 09:42:32 Chris [name] ([id number]) -------------------------------------------------------------------------------- Physical Exam Details Patient Name: Chris [name] Date of Service: 08/06/2019 9:15 AM Medical Record Number: [id number] Patient Account  Number: [id number] Date of Birth/Sex: Oct 14, 1940 (78 y.o. M) Treating RN: Curtis Sites Primary Care Provider: Lenda Kelp Other Clinician: Referring Provider: Lenda Kelp Treating Provider/Extender: STONE III, Shereena Berquist Weeks in Treatment: 5 Constitutional Well-nourished and well-hydrated in no acute distress. Respiratory normal breathing without difficulty. Psychiatric this patient is able to make decisions and demonstrates good insight into disease process. Alert and Oriented x 3. pleasant and cooperative. Notes Patient's wound bed currently showed signs of good granulation at this time. Fortunately there was only minimal slough noted that required sharp debridement today and the patient tolerated that without complication post debridement wound bed appears to be doing much better which is great news. Overall I am extremely pleased with how things are progressing. Electronic Signature(s) Signed: 08/06/2019 9:43:03 AM By: Lenda Kelp PA-C Entered By: Lenda Kelp on 08/06/2019 09:43:02 Chris [name] ([id number]) -------------------------------------------------------------------------------- Physician Orders Details Patient Name: Chris [name] Date of Service: 08/06/2019 9:15 AM Medical Record Number: [id number] Patient Account Number: [id number] Date of Birth/Sex: 1941-07-11 (77 y.o. 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 Primary Wound Dressing Wound #1 Right Calcaneus o Hydrafera Blue Ready Transfer Secondary Dressing Wound #1 Right Calcaneus o [place] Dressing Change Frequency Wound #1 Right Calcaneus o Change Dressing Monday, Wednesday, Friday Follow-up Appointments Wound #1 Right Calcaneus o Return Appointment in 1 week. Off-Loading Wound #1 Right Calcaneus o Other: - You can purchase a heel offloading shoe Electronic Signature(s) Signed: 08/06/2019 1:05:04 PM By: Curtis Sites Signed: 08/06/2019 3:30:31 PM By: Lenda Kelp PA-C Entered  By: Curtis Sites on 08/06/2019 09:35:15 Chris [name] ([id number]) -------------------------------------------------------------------------------- Problem List Details Patient Name: Chris [name] Date of Service: 08/06/2019 9:15 AM Medical Record Number: [id number] Patient Account Number: [id number] Date of Birth/Sex: [birth year] (77 y.o. M) Treating RN: Curtis Sites Primary Care Provider: Lenda Kelp Other Clinician: Referring Provider: Lenda Kelp Treating Provider/Extender: Linwood Dibbles, Dorette Hartel Weeks in Treatment: 5 Active Problems ICD-10 Evaluated Encounter Code Description Active Date Today Diagnosis E11.622 Type 2 diabetes mellitus with other skin ulcer 07/02/2019 No Yes 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 07/02/2019 No Yes layer exposed I10 Essential (primary) hypertension 07/02/2019 No Yes Inactive Problems Resolved Problems Electronic Signature(s) Signed: 08/06/2019 9:17:45 AM By: Lenda Kelp PA-C Entered By: Lenda Kelp on 08/06/2019 09:17:45 Chris [name] ([id number]) -------------------------------------------------------------------------------- Progress Note Details Patient Name: Chris [name] Date of Service: 08/06/2019 9:15 AM Medical Record Number: [id number] Patient Account Number: [id number] Date of Birth/Sex: 10-Jan-1941 (77  y.o. M) Treating RN: Curtis Sites Primary Care Provider: Lenda Kelp Other Clinician: Referring Provider: Lenda Kelp Treating Provider/Extender: Linwood Dibbles, Lorina Duffner Weeks in Treatment: 5 Subjective Chief Complaint Information obtained from Patient Right heel ulcer History of Present Illness (HPI) 07/02/2019 upon evaluation today patient presents for initial inspection concerning a wound that he has on the posterior aspect of his right heel. This has been present he tells me since earlier summer 2020. He does have a history of diabetes mellitus type 2 as well as hypertension. He has been seeing podiatry and was subsequently referred from podiatry to [place] for further evaluation and treatment due to the nonhealing wound. He states he is also been having some unfortunate pain although I believe this may be more of a nerve pain than anything else. There is no signs of infection and overall I am actually somewhat pleased with the appearance of what I see although there is some slough noted on the surface of the wound I think there may be some things we can do to speed along his recovery. No fevers, chills, nausea, vomiting, or diarrhea. The patient's blood sugar is under fairly good control with a hemoglobin A1c of 7.5 that was taken last on September 3. He was given Keflex as well although he is done with that. It did not seem to change much 1 way or another. 07/16/2019 on evaluation today patient actually appears to be doing much better with regard to his lower extremity wound on the heel. This is on the right. He has been tolerating the dressing changes which is the [facility] without complication and this seems to have done a great job for him. There is no signs of active infection at this time. No fevers, chills, nausea, vomiting, or diarrhea. 07/26/2019 upon evaluation today patient appears to be doing well today with regard to his right heel. He has been tolerating the dressing changes  without complication and the good news is there is no signs of active infection at this time. With that being said he still is very touchy at this site I think this is more neuropathy than anything. With that being said I do believe that the patient is benefiting from the collagen dressing. 08/06/2019 on evaluation today patient actually appears to be doing better and is measuring smaller with regard to his heel ulcer. He has been tolerating the dressing changes without complication. Fortunately there is no signs of active infection at this time. No fevers, chills, nausea, vomiting, or diarrhea. He tells me he still has pain unfortunately  but I believe this is nerve related pain in general. Even hurts around the wound on good skin and again that even with light touch. Patient History Information obtained from Patient. Family History Diabetes - Mother,Maternal Grandparents, No family history of Cancer, Heart Disease, Hereditary Spherocytosis, Hypertension, Kidney Disease, Lung Disease, Seizures, Stroke, Thyroid Problems, Tuberculosis. Social History Never smoker, Marital Status - Married, Alcohol Use - Never, Drug Use - No History, Caffeine Use - Never. Medical History Eyes Patient has history of Cataracts Denies history of Glaucoma, Optic Neuritis Chris [name], Chris [name] ([id number]) Ear/Nose/Mouth/Throat Denies history of Chronic sinus problems/congestion, Middle ear problems Hematologic/Lymphatic Denies history of Anemia, Hemophilia, Human Immunodeficiency Virus, Lymphedema, Sickle Cell Disease Respiratory Denies history of Aspiration, Asthma, Chronic Obstructive Pulmonary Disease (COPD), Pneumothorax, Sleep Apnea, Tuberculosis Cardiovascular Patient has history of Coronary Artery Disease, Hypertension Denies history of Angina, Arrhythmia, Congestive Heart Failure, Deep Vein Thrombosis, Hypotension, Myocardial Infarction, Peripheral Arterial Disease, Peripheral Venous Disease, Phlebitis,  Vasculitis Gastrointestinal Denies history of Cirrhosis , Colitis, Crohn s, Hepatitis A, Hepatitis B, Hepatitis C Endocrine Patient has history of Type II Diabetes Genitourinary Denies history of End Stage Renal Disease Immunological Denies history of Lupus Erythematosus, Raynaud s, Scleroderma Integumentary (Skin) Denies history of History of Burn, History of pressure wounds Musculoskeletal Denies history of Gout, Rheumatoid Arthritis, Osteoarthritis, Osteomyelitis Neurologic Patient has history of Neuropathy Denies history of Dementia, Quadriplegia, Paraplegia, Seizure Disorder Oncologic Denies history of Received Chemotherapy, Received Radiation Psychiatric Denies history of Anorexia/bulimia, Confinement Anxiety Review of Systems (ROS) Constitutional Symptoms (General Health) Denies complaints or symptoms of Fatigue, Fever, Chills, Marked Weight Change. Respiratory Denies complaints or symptoms of Chronic or frequent coughs, Shortness of Breath. Cardiovascular Denies complaints or symptoms of Chest pain, LE edema. Psychiatric Denies complaints or symptoms of Anxiety, Claustrophobia. Objective Constitutional Well-nourished and well-hydrated in no acute distress. Vitals Time Taken: 9:19 AM, Height: 73 in, Weight: 250 lbs, BMI: 33, Temperature: 98.2 F, Pulse: 63 bpm, Respiratory Rate: 16 breaths/min, Blood Pressure: 148/50 mmHg. Respiratory Chris [name], Chris [name] ([id number]) normal breathing without difficulty. Psychiatric this patient is able to make decisions and demonstrates good insight into disease process. Alert and Oriented x 3. pleasant and cooperative. General Notes: Patient's wound bed currently showed signs of good granulation at this time. Fortunately there was only minimal slough noted that required sharp debridement today and the patient tolerated that without complication post debridement wound bed appears to be doing much better which is great news. Overall I am  extremely pleased with how things are progressing. Integumentary (Hair, Skin) Wound #1 status is Open. Original cause of wound was Gradually Appeared. The wound is located on the Right Calcaneus. The wound measures 0.4cm length x 0.3cm width x 0.1cm depth; 0.094cm^2 area and 0.009cm^3 volume. There is Fat Layer (Subcutaneous Tissue) Exposed exposed. There is no tunneling or undermining noted. There is a medium amount of serosanguineous drainage noted. The wound margin is flat and intact. There is large (67-100%) pink granulation within the wound bed. There is a small (1-33%) amount of necrotic tissue within the wound bed including Adherent Slough. Assessment Active Problems ICD-10 Type 2 diabetes mellitus with other skin ulcer Non-pressure chronic ulcer of right heel and midfoot with fat layer exposed Essential (primary) hypertension Procedures Wound #1 Pre-procedure diagnosis of Wound #1 is a Diabetic Wound/Ulcer of the Lower Extremity located on the Right Calcaneus .Severity of Tissue Pre Debridement is: Fat layer exposed. There was a Excisional Skin/Subcutaneous Tissue Debridement with a total area of 0.12 sq cm performed by STONE  III, Griffon Herberg E., PA-C. With the following instrument(s): Curette to remove Viable and Non-Viable tissue/material. Material removed includes Subcutaneous Tissue, Slough, Skin: Dermis, and Skin: Epidermis after achieving pain control using Lidocaine 4% Topical Solution. No specimens were taken. A time out was conducted at 09:31, prior to the start of the procedure. A Minimum amount of bleeding was controlled with Pressure. The procedure was tolerated well with a pain level of 0 throughout and a pain level of 0 following the procedure. Post Debridement Measurements: 0.5cm length x 0.4cm width x 0.2cm depth; 0.031cm^3 volume. Character of Wound/Ulcer Post Debridement is improved. Severity of Tissue Post Debridement is: Fat layer exposed. Post procedure Diagnosis Wound  #1: Same as Pre-Procedure Plan Wound Cleansing: Chris [name], Chris [name] ([id number]) Wound #1 Right Calcaneus: Dial antibacterial soap, wash wounds, rinse and pat dry prior to dressing wounds May Shower, gently pat wound dry prior to applying new dressing. Primary Wound Dressing: Wound #1 Right Calcaneus: Hydrafera Blue Ready Transfer Secondary Dressing: Wound #1 Right Calcaneus: [place] Dressing Change Frequency: Wound #1 Right Calcaneus: Change Dressing Monday, Wednesday, Friday Follow-up Appointments: Wound #1 Right Calcaneus: Return Appointment in 1 week. Off-Loading: Wound #1 Right Calcaneus: Other: - You can purchase a heel offloading shoe 1. My suggestion at this time based on what I am seeing is good to be that we go ahead and continue with the [facility] I think that is doing a good job for him. 2. I am also going to suggest that we continue to have him as much as possible avoid any pressure to the posterior portion of his heel. He states that he does try to be as careful as he can in this regard although he does not have a heel offloading shoe he was never able to purchase 1 of these. 3. With regard to the plantar aspect of his heel the dry skin there did peel off today and everything appears to be doing okay at that location I see no issues at this point. We will see patient back for reevaluation in 1 week here in the clinic. If anything worsens or changes patient will contact our office for additional recommendations. Electronic Signature(s) Signed: 08/06/2019 9:43:44 AM By: Worthy Keeler PA-C Entered By: Worthy Keeler on 08/06/2019 09:43:44 Chris [name] ([id number]) -------------------------------------------------------------------------------- ROS/PFSH Details Patient Name: Chris [name] Date of Service: 08/06/2019 9:15 AM Medical Record Number: [id number] Patient Account Number: [id number] Date of Birth/Sex: 1941/07/08 (77 y.o. M) Treating RN: Montey Hora Primary Care Provider: Peri Jefferson Other Clinician: Referring Provider: Peri Jefferson Treating Provider/Extender: Melburn Hake, Star Cheese Weeks in Treatment: 5 Information Obtained From Patient Constitutional Symptoms (General Health) Complaints and Symptoms: Negative for: Fatigue; Fever; Chills; Marked Weight Change Respiratory Complaints and Symptoms: Negative for: Chronic or frequent coughs; Shortness of Breath Medical History: Negative for: Aspiration; Asthma; Chronic Obstructive Pulmonary Disease (COPD); Pneumothorax; Sleep Apnea; Tuberculosis Cardiovascular Complaints and Symptoms: Negative for: Chest pain; LE edema Medical History: Positive for: Coronary Artery Disease; Hypertension Negative for: Angina; Arrhythmia; Congestive Heart Failure; Deep Vein Thrombosis; Hypotension; Myocardial Infarction; Peripheral Arterial Disease; Peripheral Venous Disease; Phlebitis; Vasculitis Psychiatric Complaints and Symptoms: Negative for: Anxiety; Claustrophobia Medical History: Negative for: Anorexia/bulimia; Confinement Anxiety Eyes Medical History: Positive for: Cataracts Negative for: Glaucoma; Optic Neuritis Ear/Nose/Mouth/Throat Medical History: Negative for: Chronic sinus problems/congestion; Middle ear problems Hematologic/Lymphatic Medical History: Negative for: Anemia; Hemophilia; Human Immunodeficiency Virus; Lymphedema; Sickle Cell Disease Chris [name], Chris [name] ([id number]) Gastrointestinal Medical History: Negative for: Cirrhosis ; Colitis; Crohnos; Hepatitis A; Hepatitis B; Hepatitis C  Endocrine Medical History: Positive for: Type II Diabetes Time with diabetes: 30 years Treated with: Insulin Blood sugar tested every day: Yes Tested : Genitourinary Medical History: Negative for: End Stage Renal Disease Immunological Medical History: Negative for: Lupus Erythematosus; Raynaudos; Scleroderma Integumentary (Skin) Medical History: Negative for: History of Burn;  History of pressure wounds Musculoskeletal Medical History: Negative for: Gout; Rheumatoid Arthritis; Osteoarthritis; Osteomyelitis Neurologic Medical History: Positive for: Neuropathy Negative for: Dementia; Quadriplegia; Paraplegia; Seizure Disorder Oncologic Medical History: Negative for: Received Chemotherapy; Received Radiation HBO Extended History Items Eyes: Cataracts Immunizations Pneumococcal Vaccine: Received Pneumococcal Vaccination: Yes Implantable Devices None Family and Social History Cancer: No; Diabetes: Yes - Mother,Maternal Grandparents; Heart Disease: No; Hereditary Spherocytosis: No; Hypertension: No; Kidney Disease: No; Lung Disease: No; Seizures: No; Stroke: No; Thyroid Problems: No; TuberculosisSTEVEN, Chris [name] ([id number]) No; Never smoker; Marital Status - Married; Alcohol Use: Never; Drug Use: No History; Caffeine Use: Never; Financial Concerns: No; Food, Clothing or Shelter Needs: No; Support System Lacking: No; Transportation Concerns: No Physician Affirmation I have reviewed and agree with the above information. Electronic Signature(s) Signed: 08/06/2019 1:05:04 PM By: Curtis Sites Signed: 08/06/2019 3:30:31 PM By: Lenda Kelp PA-C Entered By: Lenda Kelp on 08/06/2019 09:42:49 Chris [name] ([id number]) -------------------------------------------------------------------------------- SuperBill Details Patient Name: Chris [name] Date of Service: 08/06/2019 Medical Record Number: [id number] Patient Account Number: [id number] Date of Birth/Sex: Aug 02, 1941 (77 y.o. 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 I10  Essential (primary) hypertension Facility Procedures CPT4 Code: [id number] Description: 11042 - DEB SUBQ TISSUE 20 SQ CM/< ICD-10 Diagnosis Description 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 Modifier: layer exposed Quantity: 1 Physician Procedures CPT4 Code: [id number] Description: 11042 - WC PHYS SUBQ TISS 20 SQ CM ICD-10 Diagnosis Description 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 Modifier: layer exposed Quantity: 1 Electronic Signature(s) Signed: 08/06/2019 9:43:51 AM By: Lenda Kelp PA-C Entered By: Lenda Kelp on 08/06/2019 09:43:51

## 2019-08-09 NOTE — Progress Notes (Signed)
Camps, Arias ([id number]) Visit Report for 08/06/2019 Arrival Information Details Patient Name: Chris [name], Chris [name] Date of Service: 08/06/2019 9:15 AM Medical Record Number: [id number] Patient Account Number: [id number] Date of Birth/Sex: [birth year] (77 y.o. M) Treating RN: Chris [name], Chris [name] Primary Care Chris [name]: Chris KelpPATEL, Chris Other Clinician: Referring Chris [name]: Chris KAudie ClearelpPATEL, Chris Treating Chris [name]/Extender: Chris [name], Chris [name] Weeks in Treatment: 5 Visit Information History Since Last Visit Added or deleted any medications: No Patient Arrived: Cane Any new allergies or adverse reactions: No Arrival Time: 09:17 Had a fall or experienced change in No Accompanied By: self activities of daily living that may affect Transfer Assistance: None risk of falls: Patient Identification Verified: Yes Signs or symptoms of abuse/neglect since last visito No Patient Has Alerts: Yes Hospitalized since last visit: No Patient Alerts: Patient on Blood Thinner Has Dressing in Place as Prescribed: Yes Pain Present Now: No Electronic Signature(s) Signed: 08/09/2019 4:22:40 PM By: Chris [name], Chris [name] Entered By: Chris [name], Chris [name] on 08/06/2019 09:17:41 Chris [name], Chris [name] ([id number]) -------------------------------------------------------------------------------- Encounter Discharge Information Details Patient Name: Chris [name], Chris [name] Date of Service: 08/06/2019 9:15 AM Medical Record Number: [id number] Patient Account Number: [id number] Date of Birth/Sex: [birth year] (77 y.o. M) Treating RN: Chris [name], Chris [name] Primary Care Chris [name]: Chris KelpPATEL, Chris Other Clinician: Referring Kyrstal Monterrosa: Chris KelpPATEL, Chris Treating Chae Shuster/Extender: Chris [name], Chris [name] Weeks in Treatment: 5 Encounter Discharge Information Items Post Procedure Vitals Discharge Condition: Stable Temperature (F): 98.2 Ambulatory Status: Cane Pulse (bpm): 63 Discharge Destination: Home Respiratory Rate (breaths/min): 16 Transportation: Private Auto Blood Pressure (mmHg):  148/50 Accompanied By: self Schedule Follow-up Appointment: Yes Clinical Summary of Care: Electronic Signature(s) Signed: 08/06/2019 1:05:04 PM By: Chris [name], Chris [name] Entered By: Chris [name], Chris [name] on 08/06/2019 09:36:10 Chris [name], Chris [name] ([id number]) -------------------------------------------------------------------------------- Lower Extremity Assessment Details Patient Name: Chris [name], Chris [name] Date of Service: 08/06/2019 9:15 AM Medical Record Number: [id number] Patient Account Number: [id number] Date of Birth/Sex: [birth year] (77 y.o. M) Treating RN: Chris [name], Chris [name] Primary Care Oda Lansdowne: Chris KelpPATEL, Chris Other Clinician: Referring Chris [name]: Chris KelpPATEL, Chris Treating Edmund Holcomb/Extender: STONE [name], Chris [name] Weeks in Treatment: 5 Edema Assessment Assessed: [Left: No] [Right: No] Edema: [Left: N] [Right: o] Vascular Assessment Pulses: Dorsalis Pedis Palpable: [Right:Yes] Electronic Signature(s) Signed: 08/09/2019 4:22:40 PM By: Chris [name], Chris [name] Entered By: Chris [name], Chris [name] on 08/06/2019 09:21:57 Chris [name], Chris [name] ([id number]) -------------------------------------------------------------------------------- Multi Wound Chart Details Patient Name: Chris [name], Chris [name] Date of Service: 08/06/2019 9:15 AM Medical Record Number: [id number] Patient Account Number: [id number] Date of Birth/Sex: [birth year] (77 y.o. M) Treating RN: Chris [name], Chris [name] Primary Care Aubrianna Orchard: Chris KelpPATEL, Chris Other Clinician: Referring Chris [name]: Chris KelpPATEL, Chris Treating Chris [name]/Extender: Chris [name], Chris [name] Weeks in Treatment: 5 Vital Signs Height(in): 73 Pulse(bpm): 63 Weight(lbs): 250 Blood Pressure(mmHg): 148/50 Body Mass Index(BMI): 33 Temperature(F): 98.2 Respiratory Rate 16 (breaths/min): Photos: [N/A:N/A] Wound Location: Right Calcaneus N/A N/A Wounding Event: Gradually Appeared N/A N/A Primary Etiology: Diabetic Wound/Ulcer of the N/A N/A Lower Extremity Comorbid History: Cataracts, Coronary Artery N/A N/A Disease, Hypertension, Type  II Diabetes, Neuropathy Date Acquired: 04/05/2019 N/A N/A Weeks of Treatment: 5 N/A N/A Wound Status: Open N/A N/A Measurements L x W x D 0.4x0.3x0.1 N/A N/A (cm) Area (cm) : 0.094 N/A N/A Volume (cm) : 0.009 N/A N/A % Reduction in Area: 80.00% N/A N/A % Reduction in Volume: 80.90% N/A N/A Classification: Grade 1 N/A N/A Exudate Amount: Medium N/A N/A Exudate Type: Serosanguineous N/A N/A Exudate Color: red, brown N/A N/A Wound Margin: Flat and Intact N/A N/A Granulation Amount: Large (67-100%) N/A N/A Granulation Quality: Pink N/A N/A Necrotic Amount: Small (1-33%) N/A N/A Exposed Structures: Fat Layer (Subcutaneous N/A N/A Tissue) Exposed: Yes Fascia: No Tendon: No  Muscle: No Chris [name] ([id number]) Joint: No Bone: No Epithelialization: Medium (34-66%) N/A N/A Treatment Notes Electronic Signature(s) Signed: 08/06/2019 1:05:04 PM By: Chris [name] Entered By: Chris [name] on 08/06/2019 09:29:33 Chris [name] ([id number]) -------------------------------------------------------------------------------- Multi-Disciplinary Care Plan Details Patient Name: Chris [name] Date of Service: 08/06/2019 9:15 AM Medical Record Number: [id number] Patient Account Number: [id number] Date of Birth/Sex: 1941-06-15 (77 y.o. M) Treating RN: Chris [name] Primary Care Bettylee Feig: Chris [name] Other Clinician: Referring Chris [name]: Chris [name] Treating Chris Chris [name], Chris [name] Weeks in Treatment: 5 Active Inactive Necrotic Tissue Nursing Diagnoses: Knowledge deficit related to management of necrotic/devitalized tissue Goals: Necrotic/devitalized tissue will be minimized in the wound bed Date Initiated: 07/02/2019 Target Resolution Date: 10/02/2019 Goal Status: Active Interventions: Provide education on necrotic tissue and debridement process Notes: Orientation to the Wound Care Program Nursing Diagnoses: Knowledge deficit related to the wound healing center  program Goals: Patient/caregiver will verbalize understanding of the [place] Program Date Initiated: 07/02/2019 Target Resolution Date: 10/02/2019 Goal Status: Active Interventions: Provide education on orientation to the wound center Notes: Peripheral Neuropathy Nursing Diagnoses: Potential alteration in peripheral tissue perfusion (select prior to confirmation of diagnosis) Goals: Patient/caregiver will verbalize understanding of disease process and disease management Date Initiated: 07/02/2019 Target Resolution Date: 10/02/2019 Goal Status: Active Interventions: Assess signs and symptoms of neuropathy upon admission and as needed Chris [name], Chris [name] ([id number]) Notes: Wound/Skin Impairment Nursing Diagnoses: Impaired tissue integrity Goals: Ulcer/skin breakdown will heal within 14 weeks Date Initiated: 07/02/2019 Target Resolution Date: 10/02/2019 Goal Status: Active Interventions: Assess patient/caregiver ability to obtain necessary supplies Assess patient/caregiver ability to perform ulcer/skin care regimen upon admission and as needed Assess ulceration(s) every visit Notes: Electronic Signature(s) Signed: 08/06/2019 1:05:04 PM By: Chris [name] Entered By: Chris [name] on 08/06/2019 09:29:13 Chris [name] ([id number]) -------------------------------------------------------------------------------- Pain Assessment Details Patient Name: Chris [name] Date of Service: 08/06/2019 9:15 AM Medical Record Number: [id number] Patient Account Number: [id number] Date of Birth/Sex: 1941/02/27 (77 y.o. M) Treating RN: Army Melia Primary Care Chey Rachels: Chris [name] Other Clinician: Referring Tyrene Nader: Chris [name] Treating Cong Hightower/Extender: Chris [name], Chris [name] Weeks in Treatment: 5 Active Problems Location of Pain Severity and Description of Pain Patient Has Paino No Site Locations Pain Management and Medication Current Pain Management: Electronic  Signature(s) Signed: 08/09/2019 4:22:40 PM By: Army Melia Entered By: Army Melia on 08/06/2019 [place], [place] ([id number]) -------------------------------------------------------------------------------- Patient/Caregiver Education Details Patient Name: Chris [name] Date of Service: 08/06/2019 9:15 AM Medical Record Number: [id number] Patient Account Number: [id number] Date of Birth/Gender: [birth year] (77 y.o. M) Treating RN: Chris [name] Primary Care Physician: Chris [name] Other Clinician: Referring Physician: Peri [name] Treating Physician/Extender: Sharalyn Ink in Treatment: 5 Education Assessment Education Provided To: Patient Education Topics Provided Wound/Skin Impairment: Handouts: Other: wound care as ordered Methods: Demonstration, Explain/Verbal Responses: State content correctly Electronic Signature(s) Signed: 08/06/2019 1:05:04 PM By: Chris [name] Entered By: Chris [name] on 08/06/2019 09:34:45 Chris [name] ([id number]) -------------------------------------------------------------------------------- Wound Assessment Details Patient Name: Chris [name] Date of Service: 08/06/2019 9:15 AM Medical Record Number: [id number] Patient Account Number: [id number] Date of Birth/Sex: 1940-12-05 (77 y.o. M) Treating RN: Army Melia Primary Care Aubrie Lucien: Chris [name] Other Clinician: Referring Mackinley Cassaday: Chris [name] Treating Idalie Canto/Extender: STONE [name], Chris [name] Weeks in Treatment: 5 Wound Status Wound Number: 1 Primary Diabetic Wound/Ulcer of the Lower Extremity Etiology: Wound Location: Right Calcaneus Wound Open Wounding Event: Gradually Appeared Status: Date Acquired: 04/05/2019 Comorbid Cataracts, Coronary Artery Disease, Weeks Of Treatment: 5 History: Hypertension, Type II Diabetes, Neuropathy Clustered Wound: No Photos Wound Measurements Length: (cm)  0.4 % Reduction in Width: (cm) 0.3 % Reduction in Depth: (cm) 0.1  Epithelializati Area: (cm) 0.094 Tunneling: Volume: (cm) 0.009 Undermining: Area: 80% Volume: 80.9% on: Medium (34-66%) No No Wound Description Classification: Grade 1 Foul Odor Afte Wound Margin: Flat and Intact Slough/Fibrino Exudate Amount: Medium Exudate Type: Serosanguineous Exudate Color: red, brown r Cleansing: No Yes Wound Bed Granulation Amount: Large (67-100%) Exposed Structure Granulation Quality: Pink Fascia Exposed: No Necrotic Amount: Small (1-33%) Fat Layer (Subcutaneous Tissue) Exposed: Yes Necrotic Quality: Adherent Slough Tendon Exposed: No Muscle Exposed: No Joint Exposed: No Bone Exposed: No Treatment Notes Chris [name], Chris [name] ([id number]) Wound #1 (Right Calcaneus) Notes Hydrofera blue and telfa Chiropodist) Signed: 08/09/2019 4:22:40 PM By: Chris Perna Entered By: Chris Perna on 08/06/2019 09:21:45 Chris Clear ([id number]) -------------------------------------------------------------------------------- Vitals Details Patient Name: Chris Clear Date of Service: 08/06/2019 9:15 AM Medical Record Number: [id number] Patient Account Number: [id number] Date of Birth/Sex: 08-Jun-1941 (77 y.o. M) Treating RN: Chris Perna Primary Care Cody Albus: Chris [name] Other Clinician: Referring Amit Leece: Chris [name] Treating Marbella Markgraf/Extender: Chris Dibbles, Chris [name] Weeks in Treatment: 5 Vital Signs Time Taken: 09:19 Temperature (F): 98.2 Height (in): 73 Pulse (bpm): 63 Weight (lbs): 250 Respiratory Rate (breaths/min): 16 Body Mass Index (BMI): 33 Blood Pressure (mmHg): 148/50 Reference Range: 80 - 120 mg / dl Electronic Signature(s) Signed: 08/09/2019 4:22:40 PM By: Chris Perna Entered By: Chris Perna on 08/06/2019 09:19:25

## 2019-08-13 ENCOUNTER — Encounter: Payer: Medicare Other | Admitting: Physician Assistant

## 2019-08-13 ENCOUNTER — Other Ambulatory Visit: Payer: Self-pay

## 2019-08-13 DIAGNOSIS — L97412 Non-pressure chronic ulcer of right heel and midfoot with fat layer exposed: Secondary | ICD-10-CM | POA: Diagnosis not present

## 2019-08-13 NOTE — Progress Notes (Addendum)
Chris [name], Chris [name] ([id number]) Visit Report for 08/13/2019 Chief Complaint Document Details Patient Name: Chris [name] Date of Service: 08/13/2019 9:15 AM Medical Record Number: [id number] Patient Account Number: [id number] Date of Birth/Sex: Nov 20, 1940 (78 y.o. M) Treating RN: Curtis Sites Primary Care Provider: Lenda Kelp Other Clinician: Referring Provider: Lenda Kelp Treating Provider/Extender: Linwood Dibbles, Ailene Royal Weeks in Treatment: 6 Information Obtained from: Patient Chief Complaint Right heel ulcer Electronic Signature(s) Signed: 08/13/2019 9:29:17 AM By: Lenda Kelp PA-C Entered By: Lenda Kelp on 08/13/2019 09:29:17 Chris [name] ([id number]) -------------------------------------------------------------------------------- HPI Details Patient Name: Chris [name] Date of Service: 08/13/2019 9:15 AM Medical Record Number: [id number] Patient Account Number: [id number] Date of Birth/Sex: March 21, 1941 (78 y.o. M) Treating RN: Curtis Sites Primary Care Provider: Lenda Kelp Other Clinician: Referring Provider: Lenda Kelp Treating Provider/Extender: Linwood Dibbles, Benedict Kue Weeks in Treatment: 6 History of Present Illness HPI Description: 07/02/2019 upon evaluation today patient presents for initial inspection concerning a wound that he has on the posterior aspect of his right heel. This has been present he tells me since earlier summer 2020. He does have a history of diabetes mellitus type 2 as well as hypertension. He has been seeing podiatry and was subsequently referred from podiatry to [place] for further evaluation and treatment due to the nonhealing wound. He states he is also been having some unfortunate pain although I believe this may be more of a nerve pain than anything else. There is no signs of infection and overall I am actually somewhat pleased with the appearance of what I see although there is some slough noted on the surface of the wound I think there may be  some things we can do to speed along his recovery. No fevers, chills, nausea, vomiting, or diarrhea. The patient's blood sugar is under fairly good control with a hemoglobin A1c of 7.5 that was taken last on September 3. He was given Keflex as well although he is done with that. It did not seem to change much 1 way or another. 07/16/2019 on evaluation today patient actually appears to be doing much better with regard to his lower extremity wound on the heel. This is on the right. He has been tolerating the dressing changes which is the [facility] without complication and this seems to have done a great job for him. There is no signs of active infection at this time. No fevers, chills, nausea, vomiting, or diarrhea. 07/26/2019 upon evaluation today patient appears to be doing well today with regard to his right heel. He has been tolerating the dressing changes without complication and the good news is there is no signs of active infection at this time. With that being said he still is very touchy at this site I think this is more neuropathy than anything. With that being said I do believe that the patient is benefiting from the collagen dressing. 08/06/2019 on evaluation today patient actually appears to be doing better and is measuring smaller with regard to his heel ulcer. He has been tolerating the dressing changes without complication. Fortunately there is no signs of active infection at this time. No fevers, chills, nausea, vomiting, or diarrhea. He tells me he still has pain unfortunately but I believe this is nerve related pain in general. Even hurts around the wound on good skin and again that even with light touch. 08/13/2019 on evaluation today patient actually appears to be doing quite well with regard to his right heel ulcer. He has been tolerating the dressing changes without complication though  he tells me he really does not have significant drainage at this point. He also has  intermittent pain that comes and goes obviously this is more nerve related. With that being said I think the wound may be becoming too dry at this point which may be slowing down his healing in general. Electronic Signature(s) Signed: 08/13/2019 10:31:25 AM By: Lenda KelpStone III, Maitland Muhlbauer PA-C Entered By: Lenda KelpStone III, Terrace Fontanilla on 08/13/2019 10:31:25 Chris [name], Chris [name] ([id number]) -------------------------------------------------------------------------------- Physical Exam Details Patient Name: Chris [name], Chris [name] Date of Service: 08/13/2019 9:15 AM Medical Record Number: [id number] Patient Account Number: [id number] Date of Birth/Sex: 1941/01/26 (78 y.o. M) Treating RN: Curtis Sitesorthy, Joanna Primary Care Provider: Lenda KelpPATEL, BANSARI Other Clinician: Referring Provider: Lenda KelpPATEL, BANSARI Treating Provider/Extender: STONE III, Gean Larose Weeks in Treatment: 6 Constitutional Well-nourished and well-hydrated in no acute distress. Respiratory normal breathing without difficulty. [name] to auscultation bilaterally. Cardiovascular regular rate and rhythm with normal S1, S2. Psychiatric this patient is able to make decisions and demonstrates good insight into disease process. Alert and Oriented x 3. pleasant and cooperative. Notes Patient's wound bed currently upon inspection actually did not require any sharp debridement and seems to be healing decently well although he has somewhat stalled over the past 2 weeks. I think we may want to switch the dressing to a silver collagen dressing to see if this will be beneficial in helping to stimulate additional epithelization at this point. No sharp debridement performed today. Electronic Signature(s) Signed: 08/13/2019 10:31:54 AM By: Lenda KelpStone III, Trygve Thal PA-C Entered By: Lenda KelpStone III, Emilynn Srinivasan on 08/13/2019 10:31:53 Chris [name], Chris [name] ([id number]) -------------------------------------------------------------------------------- Physician Orders Details Patient Name: Chris [name], Chris [name] Date of Service: 08/13/2019  9:15 AM Medical Record Number: [id number] Patient Account Number: [id number] Date of Birth/Sex: 1941/01/26 (78 y.o. 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 Primary Wound Dressing Wound #1 Right Calcaneus o Silver Collagen - moisten with saline Secondary Dressing Wound #1 Right Calcaneus o [place] Dressing Change Frequency Wound #1 Right Calcaneus o Change Dressing Monday, Wednesday, Friday Follow-up Appointments Wound #1 Right Calcaneus o Return Appointment in 2 weeks. Off-Loading Wound #1 Right Calcaneus o Other: - You can purchase a heel offloading shoe Electronic Signature(s) Signed: 08/13/2019 1:21:18 PM By: Curtis Sitesorthy, Joanna Signed: 08/13/2019 1:53:04 PM By: Lenda KelpStone III, Treven Holtman PA-C Entered By: Curtis Sitesorthy, Joanna on 08/13/2019 09:36:17 Chris [name], Drelyn ([id number]) -------------------------------------------------------------------------------- Problem List Details Patient Name: Chris [name], Chris [name] Date of Service: 08/13/2019 9:15 AM Medical Record Number: [id number] Patient Account Number: [id number] Date of Birth/Sex: 1941/01/26 (78 y.o. M) Treating RN: Curtis Sitesorthy, Joanna Primary Care Provider: Lenda KelpPATEL, BANSARI Other Clinician: Referring Provider: Lenda KelpPATEL, BANSARI Treating Provider/Extender: Linwood DibblesSTONE III, Bexley Mclester Weeks in Treatment: 6 Active Problems ICD-10 Evaluated Encounter Code Description  Active Date Today Diagnosis E11.622 Type 2 diabetes mellitus with other skin ulcer 07/02/2019 No Yes 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 07/02/2019 No Yes layer exposed I10 Essential (primary) hypertension 07/02/2019 No Yes Inactive Problems Resolved Problems Electronic Signature(s) Signed: 08/13/2019 9:29:12 AM By: Lenda KelpStone III, Yanelle Sousa PA-C Entered By: Lenda KelpStone III, Gavynn Duvall  on 08/13/2019 09:29:11 KIPPY, GOHMAN ([id number]) -------------------------------------------------------------------------------- Progress Note Details Patient Name: Chris [name], Chris [name] Date of Service: 08/13/2019 9:15 AM Medical Record Number: [id number] Patient Account Number: [id number] Date of Birth/Sex: 1941-08-02 (77 y.o. M) Treating RN: Curtis Sites Primary Care Provider: Lenda Kelp Other Clinician: Referring Provider: Lenda Kelp Treating Provider/Extender: Linwood Dibbles, Kharson Rasmusson Weeks in Treatment: 6 Subjective Chief Complaint Information obtained from Patient Right heel ulcer History of Present Illness (HPI) 07/02/2019 upon evaluation today patient presents for initial inspection concerning a wound that he has on the posterior aspect of his right heel. This has been present he tells me since earlier summer 2020. He does have a history of diabetes mellitus type 2 as well as hypertension. He has been seeing podiatry and was subsequently referred from podiatry to [place] for further evaluation and treatment due to the nonhealing wound. He states he is also been having some unfortunate pain although I believe this may be more of a nerve pain than anything else. There is no signs of infection and overall I am actually somewhat pleased with the appearance of what I see although there is some slough noted on the surface of the wound I think there may be some things we can do to speed along his recovery. No fevers, chills, nausea, vomiting, or diarrhea. The patient's blood sugar is under fairly good  control with a hemoglobin A1c of 7.5 that was taken last on September 3. He was given Keflex as well although he is done with that. It did not seem to change much 1 way or another. 07/16/2019 on evaluation today patient actually appears to be doing much better with regard to his lower extremity wound on the heel. This is on the right. He has been tolerating the dressing changes which is the [facility] without complication and this seems to have done a great job for him. There is no signs of active infection at this time. No fevers, chills, nausea, vomiting, or diarrhea. 07/26/2019 upon evaluation today patient appears to be doing well today with regard to his right heel. He has been tolerating the dressing changes without complication and the good news is there is no signs of active infection at this time. With that being said he still is very touchy at this site I think this is more neuropathy than anything. With that being said I do believe that the patient is benefiting from the collagen dressing. 08/06/2019 on evaluation today patient actually appears to be doing better and is measuring smaller with regard to his heel ulcer. He has been tolerating the dressing changes without complication. Fortunately there is no signs of active infection at this time. No fevers, chills, nausea, vomiting, or diarrhea. He tells me he still has pain unfortunately but I believe this is nerve related pain in general. Even hurts around the wound on good skin and again that even with light touch. 08/13/2019 on evaluation today patient actually appears to be doing quite well with regard to his right heel ulcer. He has been tolerating the dressing changes without complication though he tells me he really does not have significant drainage at this point. He also has intermittent pain that comes and goes obviously this is more nerve related. With that being said I think the wound may be becoming too dry at this point  which may be slowing down his healing in general. Patient History Information obtained from Patient. Family History Diabetes - Mother,Maternal Grandparents, No family history of Cancer, Heart Disease, Hereditary Spherocytosis,  Hypertension, Kidney Disease, Lung Disease, Seizures, Stroke, Thyroid Problems, Tuberculosis. Social History Never smoker, Marital Status - Married, Alcohol Use - Never, Drug Use - No History, Caffeine Use - Never. Chris [name], Chris [name] ([id number]) Medical History Eyes Patient has history of Cataracts Denies history of Glaucoma, Optic Neuritis Ear/Nose/Mouth/Throat Denies history of Chronic sinus problems/congestion, Middle ear problems Hematologic/Lymphatic Denies history of Anemia, Hemophilia, Human Immunodeficiency Virus, Lymphedema, Sickle Cell Disease Respiratory Denies history of Aspiration, Asthma, Chronic Obstructive Pulmonary Disease (COPD), Pneumothorax, Sleep Apnea, Tuberculosis Cardiovascular Patient has history of Coronary Artery Disease, Hypertension Denies history of Angina, Arrhythmia, Congestive Heart Failure, Deep Vein Thrombosis, Hypotension, Myocardial Infarction, Peripheral Arterial Disease, Peripheral Venous Disease, Phlebitis, Vasculitis Gastrointestinal Denies history of Cirrhosis , Colitis, Crohn s, Hepatitis A, Hepatitis B, Hepatitis C Endocrine Patient has history of Type II Diabetes Genitourinary Denies history of End Stage Renal Disease Immunological Denies history of Lupus Erythematosus, Raynaud s, Scleroderma Integumentary (Skin) Denies history of History of Burn, History of pressure wounds Musculoskeletal Denies history of Gout, Rheumatoid Arthritis, Osteoarthritis, Osteomyelitis Neurologic Patient has history of Neuropathy Denies history of Dementia, Quadriplegia, Paraplegia, Seizure Disorder Oncologic Denies history of Received Chemotherapy, Received Radiation Psychiatric Denies history of Anorexia/bulimia, Confinement  Anxiety Review of Systems (ROS) Constitutional Symptoms (General Health) Denies complaints or symptoms of Fatigue, Fever, Chills, Marked Weight Change. Respiratory Denies complaints or symptoms of Chronic or frequent coughs, Shortness of Breath. Cardiovascular Denies complaints or symptoms of Chest pain, LE edema. Psychiatric Denies complaints or symptoms of Anxiety, Claustrophobia. Objective Constitutional Well-nourished and well-hydrated in no acute distress. Chris [name], Chris [name] ([id number]) Vitals Time Taken: 9:19 AM, Height: 73 in, Weight: 250 lbs, BMI: 33, Temperature: 97.9 F, Pulse: 75 bpm, Respiratory Rate: 16 breaths/min, Blood Pressure: 129/50 mmHg. Respiratory normal breathing without difficulty. [name] to auscultation bilaterally. Cardiovascular regular rate and rhythm with normal S1, S2. Psychiatric this patient is able to make decisions and demonstrates good insight into disease process. Alert and Oriented x 3. pleasant and cooperative. General Notes: Patient's wound bed currently upon inspection actually did not require any sharp debridement and seems to be healing decently well although he has somewhat stalled over the past 2 weeks. I think we may want to switch the dressing to a silver collagen dressing to see if this will be beneficial in helping to stimulate additional epithelization at this point. No sharp debridement performed today. Integumentary (Hair, Skin) Wound #1 status is Open. Original cause of wound was Gradually Appeared. The wound is located on the Right Calcaneus. The wound measures 0.5cm length x 0.3cm width x 0.1cm depth; 0.118cm^2 area and 0.012cm^3 volume. There is Fat Layer (Subcutaneous Tissue) Exposed exposed. There is no tunneling or undermining noted. There is a medium amount of serosanguineous drainage noted. The wound margin is flat and intact. There is large (67-100%) pink granulation within the wound bed. There is a small (1-33%) amount of  necrotic tissue within the wound bed including Adherent Slough. Assessment Active Problems ICD-10 Type 2 diabetes mellitus with other skin ulcer Non-pressure chronic ulcer of right heel and midfoot with fat layer exposed Essential (primary) hypertension Plan Wound Cleansing: Wound #1 Right Calcaneus: Dial antibacterial soap, wash wounds, rinse and pat dry prior to dressing wounds May Shower, gently pat wound dry prior to applying new dressing. Primary Wound Dressing: Wound #1 Right Calcaneus: Silver Collagen - moisten with saline Secondary Dressing: Wound #1 Right Calcaneus: [place] Dressing Change Frequency: Wound #1 Right CalcaneusWILVER, Chris [name] ([id number]) Change Dressing Monday, Wednesday, Friday Follow-up Appointments: Wound #1  Right Calcaneus: Return Appointment in 2 weeks. Off-Loading: Wound #1 Right Calcaneus: Other: - You can purchase a heel offloading shoe 1 we will get a switch over to a silver collagen dressing at this time which the patient is in agreement with at this point. 2. I am also going to suggest that we go ahead and continue with the [place] dressing to cover as the patient seems to have done well with that in the past. 3. I am also going to recommend at this point that he continue with protecting the heel as far as offloading and pressure/friction relief is concerned. We will see patient back for reevaluation in 2 weeks here in the clinic. If anything worsens or changes patient will contact our office for additional recommendations. Electronic Signature(s) Signed: 08/13/2019 10:32:34 AM By: Lenda Kelp PA-C Entered By: Lenda Kelp on 08/13/2019 10:32:34 Chris [name] ([id number]) -------------------------------------------------------------------------------- ROS/PFSH Details Patient Name: Chris [name] Date of Service: 08/13/2019 9:15 AM Medical Record Number: [id number] Patient Account Number: [id number] Date of Birth/Sex:  19-Dec-1940 (77 y.o. M) Treating RN: Curtis Sites Primary Care Provider: Lenda Kelp Other Clinician: Referring Provider: Lenda Kelp Treating Provider/Extender: Linwood Dibbles, Janziel Hockett Weeks in Treatment: 6 Information Obtained From Patient Constitutional Symptoms (General Health) Complaints and Symptoms: Negative for: Fatigue; Fever; Chills; Marked Weight Change Respiratory Complaints and Symptoms: Negative for: Chronic or frequent coughs; Shortness of Breath Medical History: Negative for: Aspiration; Asthma; Chronic Obstructive Pulmonary Disease (COPD); Pneumothorax; Sleep Apnea; Tuberculosis Cardiovascular Complaints and Symptoms: Negative for: Chest pain; LE edema Medical History: Positive for: Coronary Artery Disease; Hypertension Negative for: Angina; Arrhythmia; Congestive Heart Failure; Deep Vein Thrombosis; Hypotension; Myocardial Infarction; Peripheral Arterial Disease; Peripheral Venous Disease; Phlebitis; Vasculitis Psychiatric Complaints and Symptoms: Negative for: Anxiety; Claustrophobia Medical History: Negative for: Anorexia/bulimia; Confinement Anxiety Eyes Medical History: Positive for: Cataracts Negative for: Glaucoma; Optic Neuritis Ear/Nose/Mouth/Throat Medical History: Negative for: Chronic sinus problems/congestion; Middle ear problems Hematologic/Lymphatic Medical History: Negative for: Anemia; Hemophilia; Human Immunodeficiency Virus; Lymphedema; Sickle Cell Disease Chris [name], Chris [name] ([id number]) Gastrointestinal Medical History: Negative for: Cirrhosis ; Colitis; Crohnos; Hepatitis A; Hepatitis B; Hepatitis C Endocrine Medical History: Positive for: Type II Diabetes Time with diabetes: 30 years Treated with: Insulin Blood sugar tested every day: Yes Tested : Genitourinary Medical History: Negative for: End Stage Renal Disease Immunological Medical History: Negative for: Lupus Erythematosus; Raynaudos; Scleroderma Integumentary  (Skin) Medical History: Negative for: History of Burn; History of pressure wounds Musculoskeletal Medical History: Negative for: Gout; Rheumatoid Arthritis; Osteoarthritis; Osteomyelitis Neurologic Medical History: Positive for: Neuropathy Negative for: Dementia; Quadriplegia; Paraplegia; Seizure Disorder Oncologic Medical History: Negative for: Received Chemotherapy; Received Radiation HBO Extended History Items Eyes: Cataracts Immunizations Pneumococcal Vaccine: Received Pneumococcal Vaccination: Yes Implantable Devices None Family and Social History Cancer: No; Diabetes: Yes - Mother,Maternal Grandparents; Heart Disease: No; Hereditary Spherocytosis: No; Hypertension: No; Kidney Disease: No; Lung Disease: No; Seizures: No; Stroke: No; Thyroid Problems: No; TuberculosisTYVON, Chris [name] ([id number]) No; Never smoker; Marital Status - Married; Alcohol Use: Never; Drug Use: No History; Caffeine Use: Never; Financial Concerns: No; Food, Clothing or Shelter Needs: No; Support System Lacking: No; Transportation Concerns: No Physician Affirmation I have reviewed and agree with the above information. Electronic Signature(s) Signed: 08/13/2019 1:21:18 PM By: Curtis Sites Signed: 08/13/2019 1:53:04 PM By: Lenda Kelp PA-C Entered By: Lenda Kelp on 08/13/2019 10:31:39 Chris [name] ([id number]) -------------------------------------------------------------------------------- SuperBill Details Patient Name: Chris [name] Date of Service: 08/13/2019 Medical Record Number: [id number] Patient Account Number: [id number] Date of Birth/Sex: 04-Mar-1941 (77 y.o.  M) Treating RN: Curtis Sites Primary Care Provider: Lenda Kelp Other Clinician: Referring Provider: Lenda Kelp Treating Provider/Extender: Linwood Dibbles, Chia Rock Weeks in Treatment: 6 Diagnosis Coding ICD-10 Codes Code Description E11.622 Type 2 diabetes mellitus with other skin ulcer L97.412 Non-pressure chronic ulcer  of right heel and midfoot with fat layer exposed I10 Essential (primary) hypertension Facility Procedures CPT4 Code: [id number] Description: 99213 - WOUND CARE VISIT-LEV 3 EST PT Modifier: Quantity: 1 Physician Procedures CPT4 Code: [id number] Description: 99214 - WC PHYS LEVEL 4 - EST PT ICD-10 Diagnosis Description E11.622 Type 2 diabetes mellitus with other skin ulcer 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 I10 Essential (primary) hypertension Modifier: layer exposed Quantity: 1 Electronic Signature(s) Signed: 08/13/2019 10:32:44 AM By: Lenda Kelp PA-C Entered By: Lenda Kelp on 08/13/2019 10:32:44

## 2019-08-23 ENCOUNTER — Other Ambulatory Visit: Payer: Self-pay

## 2019-08-23 ENCOUNTER — Ambulatory Visit (INDEPENDENT_AMBULATORY_CARE_PROVIDER_SITE_OTHER): Payer: Medicare Other | Admitting: Podiatry

## 2019-08-23 ENCOUNTER — Encounter: Payer: Self-pay | Admitting: Podiatry

## 2019-08-23 DIAGNOSIS — B351 Tinea unguium: Secondary | ICD-10-CM | POA: Diagnosis not present

## 2019-08-23 DIAGNOSIS — M79675 Pain in left toe(s): Secondary | ICD-10-CM | POA: Diagnosis not present

## 2019-08-23 DIAGNOSIS — M79674 Pain in right toe(s): Secondary | ICD-10-CM | POA: Diagnosis not present

## 2019-08-23 DIAGNOSIS — E1140 Type 2 diabetes mellitus with diabetic neuropathy, unspecified: Secondary | ICD-10-CM

## 2019-08-23 MED ORDER — LIDOCAINE 5 % EX PTCH: 1.0000 | MEDICATED_PATCH | CUTANEOUS | 0 refills | Status: DC

## 2019-08-23 NOTE — Progress Notes (Signed)
This patient presents the office with chief complaint of painful long nails. Patient has had an ulcer right ankle which is being treated at wound care center.[name]  His nails are painful walking and wearing his shoes. He presents for preventative foot care services. Patient is diabetic.      GENERAL APPEARANCE: Alert, conversant. Appropriately groomed. No acute distress.  VASCULAR: Pedal pulses are  palpable at  [facility] and PT right foot but absent left foot.  Capillary refill time is immediate to all digits,  Normal temperature gradient.   NEUROLOGIC: sensation is absent  to 5.07 monofilament at 5/5 sites bilateral.  Light touch is intact bilateral, Muscle strength normal.  MUSCULOSKELETAL: acceptable muscle strength, tone and stability bilateral.  Intrinsic muscluature intact bilateral.  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  DERMATOLOGIC: skin color, texture, and turgor are within normal limits.  No preulcerative lesions or ulcers  are seen, no interdigital maceration noted.  No open lesions present.  . No drainage noted. Peeling right heel.  NAILS  Thick disfigured discolored nails both feet.  Onychomycosis  B/L  Diabetes with neuropathy  Hallux Limitus  B/L  Foot arthritis left foot.   Debridement of nails  B/L.[name]  RTC 10 weeks.. Prescribe lidoderm 5 %.   Gardiner Barefoot DPM

## 2019-08-25 ENCOUNTER — Telehealth: Payer: Self-pay | Admitting: *Deleted

## 2019-08-25 MED ORDER — LIDOCAINE 5 % EX OINT: 1.0000 "application " | TOPICAL_OINTMENT | CUTANEOUS | 0 refills | Status: DC | PRN

## 2019-08-25 NOTE — Telephone Encounter (Signed)
Pt states he saw Dr. Prudence Davidson Monday and was to receive lidocaine at the pharmacy, but it is not at the pharmacy.

## 2019-08-25 NOTE — Telephone Encounter (Signed)
I spoke to pt and informed that the Lidocaine was confirmed received 08/23/2019 at 10:31am [facility] [address]. Pt states he did not want the patch he wanted a cream.

## 2019-08-27 ENCOUNTER — Other Ambulatory Visit: Payer: Self-pay

## 2019-08-27 ENCOUNTER — Encounter: Payer: Medicare Other | Attending: Physician Assistant | Admitting: Physician Assistant

## 2019-08-27 DIAGNOSIS — E1151 Type 2 diabetes mellitus with diabetic peripheral angiopathy without gangrene: Secondary | ICD-10-CM | POA: Insufficient documentation

## 2019-08-27 DIAGNOSIS — L97412 Non-pressure chronic ulcer of right heel and midfoot with fat layer exposed: Secondary | ICD-10-CM | POA: Insufficient documentation

## 2019-08-27 DIAGNOSIS — E11622 Type 2 diabetes mellitus with other skin ulcer: Secondary | ICD-10-CM | POA: Diagnosis present

## 2019-08-27 DIAGNOSIS — I10 Essential (primary) hypertension: Secondary | ICD-10-CM | POA: Insufficient documentation

## 2019-08-27 DIAGNOSIS — E1140 Type 2 diabetes mellitus with diabetic neuropathy, unspecified: Secondary | ICD-10-CM | POA: Insufficient documentation

## 2019-08-27 NOTE — Progress Notes (Addendum)
JAKYRIE, TOTHEROW ([id number]) Visit Report for 08/27/2019 Chief Complaint Document Details Patient Name: Chris [name], Chris [name] Date of Service: 08/27/2019 9:15 AM Medical Record Number: [id number] Patient Account Number: [id number] Date of Birth/Sex: [birth year] (77 y.o. M) Treating RN: Curtis Sites Primary Care Provider: Lenda Kelp Other Clinician: Referring Provider: Lenda Kelp Treating Provider/Extender: Linwood Dibbles, HOYT Weeks in Treatment: 8 Information Obtained from: Patient Chief Complaint Right heel ulcer Electronic Signature(s) Signed: 08/27/2019 9:17:10 AM By: Lenda Kelp PA-C Entered By: Lenda Kelp on 08/27/2019 09:17:10 Chris [name] ([id number]) -------------------------------------------------------------------------------- HPI Details Patient Name: Chris [name] Date of Service: 08/27/2019 9:15 AM Medical Record Number: [id number] Patient Account Number: [id number] Date of Birth/Sex: November 16, 1940 (77 y.o. M) Treating RN: Curtis Sites Primary Care Provider: Lenda Kelp Other Clinician: Referring Provider: Lenda Kelp Treating Provider/Extender: Linwood Dibbles, HOYT Weeks in Treatment: 8 History of Present Illness HPI Description: 07/02/2019 upon evaluation today patient presents for initial inspection concerning a wound that he has on the posterior aspect of his right heel. This has been present he tells me since earlier summer 2020. He does have a history of diabetes mellitus type 2 as well as hypertension. He has been seeing podiatry and was subsequently referred from podiatry to [place] for further evaluation and treatment due to the nonhealing wound. He states he is also been having some unfortunate pain although I believe this may be more of a nerve pain than anything else. There is no signs of infection and overall I am actually somewhat pleased with the appearance of what I see although there is some slough noted on the surface of the wound I think there may be some  things we can do to speed along his recovery. No fevers, chills, nausea, vomiting, or diarrhea. The patient's blood sugar is under fairly good control with a hemoglobin A1c of 7.5 that was taken last on September 3. He was given Keflex as well although he is done with that. It did not seem to change much 1 way or another. 07/16/2019 on evaluation today patient actually appears to be doing much better with regard to his lower extremity wound on the heel. This is on the right. He has been tolerating the dressing changes which is the [facility] without complication and this seems to have done a great job for him. There is no signs of active infection at this time. No fevers, chills, nausea, vomiting, or diarrhea. 07/26/2019 upon evaluation today patient appears to be doing well today with regard to his right heel. He has been tolerating the dressing changes without complication and the good news is there is no signs of active infection at this time. With that being said he still is very touchy at this site I think this is more neuropathy than anything. With that being said I do believe that the patient is benefiting from the collagen dressing. 08/06/2019 on evaluation today patient actually appears to be doing better and is measuring smaller with regard to his heel ulcer. He has been tolerating the dressing changes without complication. Fortunately there is no signs of active infection at this time. No fevers, chills, nausea, vomiting, or diarrhea. He tells me he still has pain unfortunately but I believe this is nerve related pain in general. Even hurts around the wound on good skin and again that even with light touch. 08/13/2019 on evaluation today patient actually appears to be doing quite well with regard to his right heel ulcer. He has been tolerating the dressing changes without complication though  he tells me he really does not have significant drainage at this point. He also has  intermittent pain that comes and goes obviously this is more nerve related. With that being said I think the wound may be becoming too dry at this point which may be slowing down his healing in general. 08/27/2019 on evaluation today patient appears to be doing excellent in regard to his heel ulcer. He has been tolerating the dressing changes without complication. At this point he appears to be completely healed. Electronic Signature(s) Signed: 08/27/2019 9:57:28 AM By: Lenda Kelp PA-C Entered By: Lenda Kelp on 08/27/2019 09:57:27 Chris [name] ([id number]) -------------------------------------------------------------------------------- Physical Exam Details Patient Name: Chris [name] Date of Service: 08/27/2019 9:15 AM Medical Record Number: [id number] Patient Account Number: [id number] Date of Birth/Sex: December 29, 1940 (77 y.o. M) Treating RN: Curtis Sites Primary Care Provider: Lenda Kelp Other Clinician: Referring Provider: Lenda Kelp Treating Provider/Extender: STONE III, HOYT Weeks in Treatment: 8 Constitutional Well-nourished and well-hydrated in no acute distress. Respiratory normal breathing without difficulty. Psychiatric this patient is able to make decisions and demonstrates good insight into disease process. Alert and Oriented x 3. pleasant and cooperative. Notes His wound bed currently showed signs again of complete epithelization there is no signs of open wound at this time and overall he seems to be doing excellent. No fevers, chills, nausea, vomiting, or diarrhea. Electronic Signature(s) Signed: 08/27/2019 9:58:08 AM By: Lenda Kelp PA-C Entered By: Lenda Kelp on 08/27/2019 09:58:07 Chris [name] ([id number]) -------------------------------------------------------------------------------- Physician Orders Details Patient Name: Chris [name] Date of Service: 08/27/2019 9:15 AM Medical Record Number: [id number] Patient Account Number:  [id number] Date of Birth/Sex: August 10, 1941 (77 y.o. 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 M) Treating RN: Curtis Sites Primary Care Provider: Lenda Kelp Other Clinician: Referring Provider: Lenda Kelp Treating Provider/Extender: Linwood Dibbles, HOYT Weeks in Treatment: 8 Active Problems ICD-10 Evaluated Encounter Code Description Active Date Today Diagnosis E11.622 Type 2 diabetes mellitus with other skin ulcer 07/02/2019 No Yes 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 07/02/2019 No Yes layer exposed I10 Essential (primary) hypertension 07/02/2019 No Yes Inactive Problems Resolved Problems Electronic Signature(s) Signed: 08/27/2019 9:17:05 AM By: Lenda Kelp PA-C Entered By: Lenda Kelp on 08/27/2019 09:17:05 Chris [name] ([id number]) -------------------------------------------------------------------------------- Progress Note Details Patient Name: Chris [name] Date of Service: 08/27/2019 9:15 AM Medical  Record Number: [id number] Patient Account Number: [id number] Date of Birth/Sex: 03-Dec-1940 (77 y.o. M) Treating RN: Curtis Sites Primary Care Provider: Lenda Kelp Other Clinician: Referring Provider: Lenda Kelp Treating Provider/Extender: Linwood Dibbles, HOYT Weeks in Treatment: 8 Subjective Chief Complaint Information obtained from Patient Right heel ulcer History of Present Illness (HPI) 07/02/2019 upon evaluation today patient presents for initial inspection concerning a wound that he has on the posterior aspect of  his right heel. This has been present he tells me since earlier summer 2020. He does have a history of diabetes mellitus type 2 as well as hypertension. He has been seeing podiatry and was subsequently referred from podiatry to [place] for further evaluation and treatment due to the nonhealing wound. He states he is also been having some unfortunate pain although I believe this may be more of a nerve pain than anything else. There is no signs of infection and overall I am actually somewhat pleased with the appearance of what I see although there is some slough noted on the surface of the wound I think there may be some things we can do to speed along his recovery. No fevers, chills, nausea, vomiting, or diarrhea. The patient's blood sugar is under fairly good control with a hemoglobin A1c of 7.5 that was taken last on September 3. He was given Keflex as well although he is done with that. It did not seem to change much 1 way or another. 07/16/2019 on evaluation today patient actually appears to be doing much better with regard to his lower extremity wound on the heel. This is on the right. He has been tolerating the dressing changes which is the [facility] without complication and this seems to have done a great job for him. There is no signs of active infection at this time. No fevers, chills, nausea, vomiting, or diarrhea. 07/26/2019 upon evaluation today patient appears to  be doing well today with regard to his right heel. He has been tolerating the dressing changes without complication and the good news is there is no signs of active infection at this time. With that being said he still is very touchy at this site I think this is more neuropathy than anything. With that being said I do believe that the patient is benefiting from the collagen dressing. 08/06/2019 on evaluation today patient actually appears to be doing better and is measuring smaller with regard to his heel ulcer. He has been tolerating the dressing changes without complication. Fortunately there is no signs of active infection at this time. No fevers, chills, nausea, vomiting, or diarrhea. He tells me he still has pain unfortunately but I believe this is nerve related pain in general. Even hurts around the wound on good skin and again that even with light touch. 08/13/2019 on evaluation today patient actually appears to be doing quite well with regard to his right heel ulcer. He has been tolerating the dressing changes without complication though he tells me he really does not have significant drainage at this point. He also has intermittent pain that comes and goes obviously this is more nerve related. With that being said I think the wound may be becoming too dry at this point which may be slowing down his healing in general. 08/27/2019 on evaluation today patient appears to be doing excellent in regard to his heel ulcer. He has been tolerating the dressing changes without complication. At this point he appears to be completely healed. Patient History Information obtained from Patient. Family History Diabetes - Mother,Maternal Grandparents, No family history of Cancer, Heart Disease, Hereditary Spherocytosis, Hypertension, Kidney Disease, Lung Disease, Seizures, Stroke, Thyroid Problems, Tuberculosis. HUNNER, GARCON ([id number]) Social History Never smoker, Marital Status - Married, Alcohol Use -  Never, Drug Use - No History, Caffeine Use - Never. Medical History Eyes Patient has history of Cataracts Denies history of Glaucoma, Optic Neuritis Ear/Nose/Mouth/Throat Denies history of Chronic sinus problems/congestion, Middle ear problems Hematologic/Lymphatic Denies  history of Anemia, Hemophilia, Human Immunodeficiency Virus, Lymphedema, Sickle Cell Disease Respiratory Denies history of Aspiration, Asthma, Chronic Obstructive Pulmonary Disease (COPD), Pneumothorax, Sleep Apnea, Tuberculosis Cardiovascular Patient has history of Coronary Artery Disease, Hypertension Denies history of Angina, Arrhythmia, Congestive Heart Failure, Deep Vein Thrombosis, Hypotension, Myocardial Infarction, Peripheral Arterial Disease, Peripheral Venous Disease, Phlebitis, Vasculitis Gastrointestinal Denies history of Cirrhosis , Colitis, Crohn s, Hepatitis A, Hepatitis B, Hepatitis C Endocrine Patient has history of Type II Diabetes Genitourinary Denies history of End Stage Renal Disease Immunological Denies history of Lupus Erythematosus, Raynaud s, Scleroderma Integumentary (Skin) Denies history of History of Burn, History of pressure wounds Musculoskeletal Denies history of Gout, Rheumatoid Arthritis, Osteoarthritis, Osteomyelitis Neurologic Patient has history of Neuropathy Denies history of Dementia, Quadriplegia, Paraplegia, Seizure Disorder Oncologic Denies history of Received Chemotherapy, Received Radiation Psychiatric Denies history of Anorexia/bulimia, Confinement Anxiety Review of Systems (ROS) Constitutional Symptoms (General Health) Denies complaints or symptoms of Fatigue, Fever, Chills, Marked Weight Change. Respiratory Denies complaints or symptoms of Chronic or frequent coughs, Shortness of Breath. Cardiovascular Denies complaints or symptoms of Chest pain, LE edema. Psychiatric Denies complaints or symptoms of Anxiety, Claustrophobia. Objective SWANSON, FARNELL  ([id number]) Constitutional Well-nourished and well-hydrated in no acute distress. Vitals Time Taken: 9:23 AM, Height: 73 in, Weight: 250 lbs, BMI: 33, Temperature: 97.9 F, Pulse: 62 bpm, Respiratory Rate: 16 breaths/min, Blood Pressure: 154/52 mmHg. Respiratory normal breathing without difficulty. Psychiatric this patient is able to make decisions and demonstrates good insight into disease process. Alert and Oriented x 3. pleasant and cooperative. General Notes: His wound bed currently showed signs again of complete epithelization there is no signs of open wound at this time and overall he seems to be doing excellent. No fevers, chills, nausea, vomiting, or diarrhea. Integumentary (Hair, Skin) Wound #1 status is Healed - Epithelialized. Original cause of wound was Gradually Appeared. The wound is located on the Right Calcaneus. The wound measures 0cm length x 0cm width x 0cm depth; 0cm^2 area and 0cm^3 volume. There is no tunneling or undermining noted. There is a none present amount of drainage noted. The wound margin is flat and intact. There is large (67-100%) pink granulation within the wound bed. There is no necrotic tissue within the wound bed. Assessment Active Problems ICD-10 Type 2 diabetes mellitus with other skin ulcer Non-pressure chronic ulcer of right heel and midfoot with fat layer exposed Essential (primary) hypertension Plan Discharge From [facility] Services: Discharge from [place] 1. I would recommend at this time that we discontinue wound care services as the patient is completely healed and seems to be doing excellent. 2. I would recommend a protective padded dressing for the next month in order just to ensure this does not reopen he is in agreement with this plan. After he gets out of that he will then transition into his new shoes. Follow-up as needed. NECHEMIA, CHIAPPETTA ([id number]) Electronic Signature(s) Signed: 08/27/2019 9:58:33 AM By: Worthy Keeler  PA-C Entered By: Worthy Keeler on 08/27/2019 09:58:33 Lyda Kalata ([id number]) -------------------------------------------------------------------------------- ROS/PFSH Details Patient Name: Lyda Kalata Date of Service: 08/27/2019 9:15 AM Medical Record Number: [id number] Patient Account Number: [id number] Date of Birth/Sex: 31-Oct-1940 (77 y.o. M) Treating RN: Montey Hora Primary Care Provider: Peri Jefferson Other Clinician: Referring Provider: Peri Jefferson Treating Provider/Extender: Melburn Hake, HOYT Weeks in Treatment: 8 Information Obtained From Patient Constitutional Symptoms (General Health) Complaints and Symptoms: Negative for: Fatigue; Fever; Chills; Marked Weight Change Respiratory Complaints and Symptoms: Negative for: Chronic or frequent coughs; Shortness  of Breath Medical History: Negative for: Aspiration; Asthma; Chronic Obstructive Pulmonary Disease (COPD); Pneumothorax; Sleep Apnea; Tuberculosis Cardiovascular Complaints and Symptoms: Negative for: Chest pain; LE edema Medical History: Positive for: Coronary Artery Disease; Hypertension Negative for: Angina; Arrhythmia; Congestive Heart Failure; Deep Vein Thrombosis; Hypotension; Myocardial Infarction; Peripheral Arterial Disease; Peripheral Venous Disease; Phlebitis; Vasculitis Psychiatric Complaints and Symptoms: Negative for: Anxiety; Claustrophobia Medical History: Negative for: Anorexia/bulimia; Confinement Anxiety Eyes Medical History: Positive for: Cataracts Negative for: Glaucoma; Optic Neuritis Ear/Nose/Mouth/Throat Medical History: Negative for: Chronic sinus problems/congestion; Middle ear problems Hematologic/Lymphatic Medical History: Negative for: Anemia; Hemophilia; Human Immunodeficiency Virus; Lymphedema; Sickle Cell Disease Chris ClearRYAN, Wai ([id number]) Gastrointestinal Medical History: Negative for: Cirrhosis ; Colitis; Crohnos; Hepatitis A; Hepatitis B; Hepatitis  C Endocrine Medical History: Positive for: Type II Diabetes Time with diabetes: 30 years Treated with: Insulin Blood sugar tested every day: Yes Tested : Genitourinary Medical History: Negative for: End Stage Renal Disease Immunological Medical History: Negative for: Lupus Erythematosus; Raynaudos; Scleroderma Integumentary (Skin) Medical History: Negative for: History of Burn; History of pressure wounds Musculoskeletal Medical History: Negative for: Gout; Rheumatoid Arthritis; Osteoarthritis; Osteomyelitis Neurologic Medical History: Positive for: Neuropathy Negative for: Dementia; Quadriplegia; Paraplegia; Seizure Disorder Oncologic Medical History: Negative for: Received Chemotherapy; Received Radiation HBO Extended History Items Eyes: Cataracts Immunizations Pneumococcal Vaccine: Received Pneumococcal Vaccination: Yes Implantable Devices None Family and Social History Cancer: No; Diabetes: Yes - Mother,Maternal Grandparents; Heart Disease: No; Hereditary Spherocytosis: No; Hypertension: No; Kidney Disease: No; Lung Disease: No; Seizures: No; Stroke: No; Thyroid Problems: No; TuberculosisAudie [name]: Iglesia, Bence ([id number]) No; Never smoker; Marital Status - Married; Alcohol Use: Never; Drug Use: No History; Caffeine Use: Never; Financial Concerns: No; Food, Clothing or Shelter Needs: No; Support System Lacking: No; Transportation Concerns: No Physician Affirmation I have reviewed and agree with the above information. Electronic Signature(s) Signed: 08/27/2019 5:08:51 PM By: Curtis Sitesorthy, Joanna Signed: 08/28/2019 11:37:39 PM By: Lenda KelpStone III, Hoyt PA-C Entered By: Lenda KelpStone III, Hoyt on 08/27/2019 09:57:49 Chris ClearYAN, Vonn ([id number]) -------------------------------------------------------------------------------- SuperBill Details Patient Name: Chris ClearYAN, Mandy Date of Service: 08/27/2019 Medical Record Number: [id number] Patient Account Number: [id number] Date of Birth/Sex: 1941/03/02 (77  y.o. 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 I10 Essential (primary) hypertension Facility Procedures CPT4 Code: [id number] Description: 99213 - WOUND CARE VISIT-LEV 3 EST PT Modifier: Quantity: 1 Physician Procedures CPT4 Code: [id number] Description: 99213 - WC PHYS LEVEL 3 - EST PT ICD-10 Diagnosis Description E11.622 Type 2 diabetes mellitus with other skin ulcer 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 I10 Essential (primary) hypertension Modifier: layer exposed Quantity: 1 Electronic Signature(s) Signed: 08/27/2019 10:19:42 AM By: Curtis Sitesorthy, Joanna Signed: 08/28/2019 11:37:39 PM By: Lenda KelpStone III, Hoyt PA-C Previous Signature: 08/27/2019 9:58:46 AM Version By: Lenda KelpStone III, Hoyt PA-C Entered By: Curtis Sitesorthy, Joanna on 08/27/2019 10:19:42

## 2019-08-27 NOTE — Progress Notes (Addendum)
TAEDYN, GLASSCOCK ([id number]) Visit Report for 08/27/2019 Arrival Information Details Patient Name: Chris, [name] Date of Service: 08/27/2019 9:15 AM Medical Record Number: [id number] Patient Account Number: [id number] Date of Birth/Sex: 09-Jun-1941 (77 y.o. M) Treating RN: Huel Coventry Primary Care Bessie Boyte: Lenda Kelp Other Clinician: Referring Modelle Vollmer: Lenda Kelp Treating Caridad Silveira/Extender: Linwood Dibbles, HOYT Weeks in Treatment: 8 Visit Information History Since Last Visit Added or deleted any medications: No Patient Arrived: Cane Any new allergies or adverse reactions: No Arrival Time: 09:20 Had a fall or experienced change in No Accompanied By: self activities of daily living that may affect Transfer Assistance: None risk of falls: Patient Identification Verified: Yes Signs or symptoms of abuse/neglect since last visito No Secondary Verification Process Yes Hospitalized since last visit: No Completed: Implantable device outside of the clinic excluding No Patient Has Alerts: Yes cellular tissue based products placed in the center Patient Alerts: Patient on Blood since last visit: Thinner Has Dressing in Place as Prescribed: Yes Pain Present Now: No Electronic Signature(s) Signed: 08/27/2019 11:14:50 AM By: Elliot Gurney, BSN, RN, CWS, Kim RN, BSN Entered By: Elliot Gurney, BSN, RN, CWS, Kim on 08/27/2019 09:21:23 Chris [name] ([id number]) -------------------------------------------------------------------------------- Clinic Level of Care Assessment Details Patient Name: Chris [name] Date of Service: 08/27/2019 9:15 AM Medical Record Number: [id number] Patient Account Number: [id number] Date of Birth/Sex: 1940/12/25 (77 y.o. M) Treating RN: Curtis Sites Primary Care Blake Goya: Lenda Kelp Other Clinician: Referring Madyn Ivins: Lenda Kelp Treating Margan Elias/Extender: Linwood Dibbles, HOYT Weeks in Treatment: 8 Clinic Level of Care Assessment Items TOOL 4 Quantity Score []  - Use when  only an EandM is performed on FOLLOW-UP visit 0 ASSESSMENTS - Nursing Assessment / Reassessment X - Reassessment of Co-morbidities (includes updates in patient status) 1 10 X- 1 5 Reassessment of Adherence to Treatment Plan ASSESSMENTS - Wound and Skin Assessment / Reassessment X - Simple Wound Assessment / Reassessment - one wound 1 5 []  - 0 Complex Wound Assessment / Reassessment - multiple wounds []  - 0 Dermatologic / Skin Assessment (not related to wound area) ASSESSMENTS - Focused Assessment []  - Circumferential Edema Measurements - multi extremities 0 []  - 0 Nutritional Assessment / Counseling / Intervention X- 1 5 Lower Extremity Assessment (monofilament, tuning fork, pulses) []  - 0 Peripheral Arterial Disease Assessment (using hand held doppler) ASSESSMENTS - Ostomy and/or Continence Assessment and Care []  - Incontinence Assessment and Management 0 []  - 0 Ostomy Care Assessment and Management (repouching, etc.) PROCESS - Coordination of Care X - Simple Patient / Family Education for ongoing care 1 15 []  - 0 Complex (extensive) Patient / Family Education for ongoing care X- 1 10 Staff obtains , Records, Test Results / Process Orders []  - 0 Staff telephones HHA, Nursing Homes / Clarify orders / etc []  - 0 Routine Transfer to another Facility (non-emergent condition) []  - 0 Routine Hospital Admission (non-emergent condition) []  - 0 New Admissions / / Ordering NPWT, Apligraf, etc. []  - 0 Emergency Hospital Admission (emergent condition) X- 1 10 Simple Discharge Coordination Chris, [name] ( ) []  - 0 Complex (extensive) Discharge Coordination PROCESS - Special Needs []  - Pediatric / Minor Patient Management 0 []  - 0 Isolation Patient Management []  - 0 Hearing / Language / Visual special needs []  - 0 Assessment of Community assistance (transportation, D/C planning, etc.) []  - 0 Additional assistance / Altered mentation []   - 0 Support Surface(s) Assessment (bed, cushion, seat, etc.) INTERVENTIONS - Wound Cleansing / Measurement X - Simple Wound Cleansing - one wound 1 5 []  -  0 Complex Wound Cleansing - multiple wounds X- 1 5 Wound Imaging (photographs - any number of wounds) []  - 0 Wound Tracing (instead of photographs) X- 1 5 Simple Wound Measurement - one wound []  - 0 Complex Wound Measurement - multiple wounds INTERVENTIONS - Wound Dressings []  - Small Wound Dressing one or multiple wounds 0 []  - 0 Medium Wound Dressing one or multiple wounds []  - 0 Large Wound Dressing one or multiple wounds []  - 0 Application of Medications - topical []  - 0 Application of Medications - injection INTERVENTIONS - Miscellaneous []  - External ear exam 0 []  - 0 Specimen Collection (cultures, biopsies, blood, body fluids, etc.) []  - 0 Specimen(s) / Culture(s) sent or taken to Lab for analysis []  - 0 Patient Transfer (multiple staff / Nurse, adultHoyer Lift / Similar devices) []  - 0 Simple Staple / Suture removal (25 or less) []  - 0 Complex Staple / Suture removal (26 or more) []  - 0 Hypo / Hyperglycemic Management (close monitor of Blood Glucose) []  - 0 Ankle / Brachial Index (ABI) - do not check if billed separately X- 1 5 Vital Signs Chris, [name] ([id number]) Has the patient been seen at the hospital within the last three years: Yes Total Score: 80 Level Of Care: New/Established - Level 3 Electronic Signature(s) Signed: 08/27/2019 5:08:51 PM By: Curtis Sitesorthy, Joanna Entered By: Curtis Sitesorthy, Joanna on 08/27/2019 10:19:35 Chris [name], Redford ([id number]) -------------------------------------------------------------------------------- Encounter Discharge Information Details Patient Name: Chris [name], [name] Date of Service: 08/27/2019 9:15 AM Medical Record Number: [id number] Patient Account Number: [id number] Date of Birth/Sex: April 02, 1941 (77 y.o. M) Treating RN: Curtis Sitesorthy, Joanna Primary Care Jaleen Grupp: Lenda KelpPATEL, BANSARI Other  Clinician: Referring Adis Sturgill: Lenda KelpPATEL, BANSARI Treating Dietrich Ke/Extender: Linwood DibblesSTONE III, HOYT Weeks in Treatment: 8 Encounter Discharge Information Items Discharge Condition: Stable Ambulatory Status: Cane Discharge Destination: Home Transportation: Private Auto Accompanied By: self Schedule Follow-up Appointment: No Clinical Summary of Care: Electronic Signature(s) Signed: 08/27/2019 10:20:16 AM By: Curtis Sitesorthy, Joanna Entered By: Curtis Sitesorthy, Joanna on 08/27/2019 10:20:16 Chris [name], Chris [name] ([id number]) -------------------------------------------------------------------------------- Lower Extremity Assessment Details Patient Name: Chris [name], Chris [name] Date of Service: 08/27/2019 9:15 AM Medical Record Number: [id number] Patient Account Number: [id number] Date of Birth/Sex: April 02, 1941 (77 y.o. M) Treating RN: Huel CoventryWoody, Kim Primary Care Loxley Cibrian: Lenda KelpPATEL, BANSARI Other Clinician: Referring Mashanda Ishibashi: Lenda KelpPATEL, BANSARI Treating Lyman Balingit/Extender: Linwood DibblesSTONE III, HOYT Weeks in Treatment: 8 Vascular Assessment Pulses: Dorsalis Pedis Palpable: [Right:Yes] Electronic Signature(s) Signed: 08/27/2019 11:14:50 AM By: Elliot GurneyWoody, BSN, RN, CWS, Kim RN, BSN Entered By: Elliot GurneyWoody, BSN, RN, CWS, Kim on 08/27/2019 09:30:33 Chris [name], Chris [name] ([id number]) -------------------------------------------------------------------------------- Multi-Disciplinary Care Plan Details Patient Name: Chris [name], Chris [name] Date of Service: 08/27/2019 9:15 AM Medical Record Number: [id number] Patient Account Number: [id number] Date of Birth/Sex: April 02, 1941 (77 y.o. M) Treating RN: Curtis Sitesorthy, Joanna Primary Care Tyrhonda Georgiades: Lenda KelpPATEL, BANSARI Other Clinician: Referring Darnel Mchan: Lenda KelpPATEL, BANSARI Treating Anysa Tacey/Extender: Linwood DibblesSTONE III, HOYT Weeks in Treatment: 8 Active Inactive Electronic Signature(s) Signed: 08/27/2019 10:19:10 AM By: Curtis Sitesorthy, Joanna Entered By: Curtis Sitesorthy, Joanna on 08/27/2019 10:19:09 Chris [name], Chris [name]  ([id number]) -------------------------------------------------------------------------------- Pain Assessment Details Patient Name: Chris [name], Stokely Date of Service: 08/27/2019 9:15 AM Medical Record Number: [id number] Patient Account Number: [id number] Date of Birth/Sex: April 02, 1941 (77 y.o. M) Treating RN: Huel CoventryWoody, Kim Primary Care Lee-Ann Gal: Lenda KelpPATEL, BANSARI Other Clinician: Referring Darienne Belleau: Lenda KelpPATEL, BANSARI Treating Rohan Juenger/Extender: Linwood DibblesSTONE III, HOYT Weeks in Treatment: 8 Active Problems Location of Pain Severity and Description of Pain Patient Has Paino No Site Locations Pain Management and Medication Current Pain Management: Notes Patient denies pain at this time. Electronic Signature(s) Signed: 08/27/2019 11:14:50 AM By: Elliot GurneyWoody, BSN, RN, CWS, Kim  RN, BSN Entered By: Gretta Cool, BSN, RN, CWS, Kim on 08/27/2019 09:23:07 Chris [name] ([id number]) -------------------------------------------------------------------------------- Patient/Caregiver Education Details Patient Name: Chris [name] Date of Service: 08/27/2019 9:15 AM Medical Record Number: [id number] Patient Account Number: [id number] Date of Birth/Gender: 09-09-1941 (77 y.o. M) Treating RN: Montey Hora Primary Care Physician: Peri Jefferson Other Clinician: Referring Physician: Peri Jefferson Treating Physician/Extender: Sharalyn Ink in Treatment: 8 Education Assessment Education Provided To: Patient Education Topics Provided Basic Hygiene: Handouts: Other: care of newly healed wound site Methods: Explain/Verbal Responses: State content correctly Electronic Signature(s) Signed: 08/27/2019 5:08:51 PM By: Montey Hora Entered By: Montey Hora on 08/27/2019 10:19:59 Chris [name] ([id number]) -------------------------------------------------------------------------------- Wound Assessment Details Patient Name: Chris [name] Date of Service: 08/27/2019 9:15 AM Medical Record Number: [id number] Patient Account  Number: [id number] Date of Birth/Sex: 05-30-1941 (77 y.o. M) Treating RN: Montey Hora Primary Care Coleen Cardiff: Peri Jefferson Other Clinician: Referring Amerie Beaumont: Peri Jefferson Treating Yuchen Fedor/Extender: Melburn Hake, HOYT Weeks in Treatment: 8 Wound Status Wound Number: 1 Primary Diabetic Wound/Ulcer of the Lower Extremity Etiology: Wound Location: Right Calcaneus Wound Healed - Epithelialized Wounding Event: Gradually Appeared Status: Date Acquired: 04/05/2019 Comorbid Cataracts, Coronary Artery Disease, Weeks Of Treatment: 8 History: Hypertension, Type II Diabetes, Neuropathy Clustered Wound: No Photos Wound Measurements Length: (cm) 0 % Reduct Width: (cm) 0 % Reduct Depth: (cm) 0 Epitheli Area: (cm) 0 Tunneli Volume: (cm) 0 Undermi ion in Area: 100% ion in Volume: 100% alization: Large (67-100%) ng: No ning: No Wound Description Classification: Grade 1 Wound Margin: Flat and Intact Exudate Amount: None Present Foul Odor After Cleansing: No Slough/Fibrino No Wound Bed Granulation Amount: Large (67-100%) Exposed Structure Granulation Quality: Pink Fascia Exposed: No Necrotic Amount: None Present (0%) Fat Layer (Subcutaneous Tissue) Exposed: No Tendon Exposed: No Muscle Exposed: No Joint Exposed: No Bone Exposed: No Electronic Signature(s) Signed: 08/27/2019 5:08:51 PM By: Montey Hora Entered By: Montey Hora on 08/27/2019 09:53:13 Chris [name] ([id number] [name] ([id number]) -------------------------------------------------------------------------------- Vitals Details Patient Name: Chris [name] Date of Service: 08/27/2019 9:15 AM Medical Record Number: [id number] Patient Account Number: [id number] Date of Birth/Sex: [birth year] (78 y.o. M) Treating RN: Cornell Barman Primary Care Tu Bayle: Peri Jefferson Other Clinician: Referring Jeanet Lupe: Peri Jefferson Treating Gisele Pack/Extender: Melburn Hake, HOYT Weeks in Treatment: 8 Vital Signs Time Taken:  09:23 Temperature (F): 97.9 Height (in): 73 Pulse (bpm): 62 Weight (lbs): 250 Respiratory Rate (breaths/min): 16 Body Mass Index (BMI): 33 Blood Pressure (mmHg): 154/52 Reference Range: 80 - 120 mg / dl Electronic Signature(s) Signed: 08/27/2019 11:14:50 AM By: Gretta Cool, BSN, RN, CWS, Kim RN, BSN Entered By: Gretta Cool, BSN, RN, CWS, Kim on 08/27/2019 [phone]

## 2019-09-02 ENCOUNTER — Ambulatory Visit: Payer: Medicare Other | Attending: Neurology

## 2019-09-02 DIAGNOSIS — G4733 Obstructive sleep apnea (adult) (pediatric): Secondary | ICD-10-CM | POA: Insufficient documentation

## 2019-09-20 ENCOUNTER — Other Ambulatory Visit: Payer: Self-pay

## 2019-09-23 ENCOUNTER — Telehealth: Payer: Self-pay

## 2019-09-23 MED ORDER — LIDOCAINE 5 % EX OINT: 1.0000 "application " | TOPICAL_OINTMENT | CUTANEOUS | 2 refills | Status: DC | PRN

## 2019-09-23 NOTE — Telephone Encounter (Signed)
Patient called requesting a refill for Lidocaine ointment.  Per Dr. Burnell Blanks verbal order, ok to give 2 refills.  Script has been sent to pharmacy

## 2019-10-18 ENCOUNTER — Other Ambulatory Visit: Payer: Self-pay

## 2019-10-18 ENCOUNTER — Other Ambulatory Visit
Admission: RE | Admit: 2019-10-18 | Discharge: 2019-10-18 | Disposition: A | Discharge status: Home / Self Care | Payer: Medicare Other | Source: Ambulatory Visit | Attending: Acute Care | Admitting: Acute Care

## 2019-10-18 DIAGNOSIS — Z20822 Contact with and (suspected) exposure to covid-19: Secondary | ICD-10-CM | POA: Insufficient documentation

## 2019-10-18 DIAGNOSIS — Z01812 Encounter for preprocedural laboratory examination: Secondary | ICD-10-CM | POA: Insufficient documentation

## 2019-10-18 LAB — SARS CORONAVIRUS 2 (TAT 6-24 HRS): SARS Coronavirus 2: NEGATIVE

## 2019-10-20 ENCOUNTER — Ambulatory Visit: Payer: Medicare Other | Attending: Neurology

## 2019-10-20 DIAGNOSIS — G4761 Periodic limb movement disorder: Secondary | ICD-10-CM | POA: Insufficient documentation

## 2019-10-20 DIAGNOSIS — G4733 Obstructive sleep apnea (adult) (pediatric): Secondary | ICD-10-CM | POA: Diagnosis present

## 2019-10-21 ENCOUNTER — Other Ambulatory Visit: Payer: Self-pay

## 2019-11-01 ENCOUNTER — Ambulatory Visit: Payer: Medicare Other | Admitting: Podiatry

## 2021-07-13 IMAGING — MR MR HEAD W/O CM
9 of 10 series · 42 of 48 positions shown · non-contrast
Comparison: Head CT 07/18/2019

CLINICAL DATA: Stroke follow-up. Encephalopathy.

EXAM:
MRI HEAD WITHOUT CONTRAST
TECHNIQUE: Multiplanar, multiecho pulse sequences of the brain and surrounding
structures were obtained without intravenous contrast.

[Series 2: ax dwi_tracew · axial · 3.0mm · 0.83mm/px · z∈[-27,+135]mm · 7 of 56 slices shown]
[im 1/56]
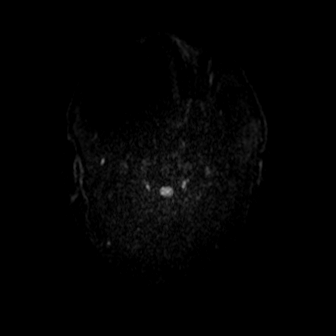
[im 10/56]
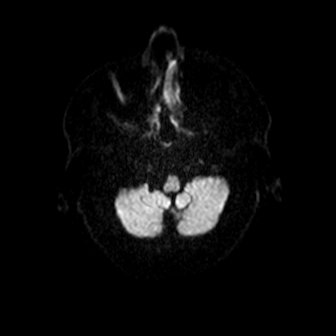
[im 19/56]
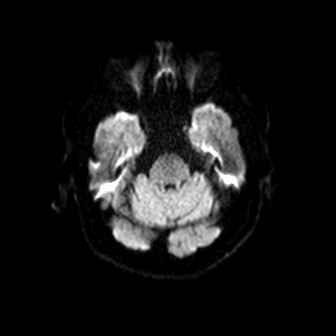
[im 28/56]
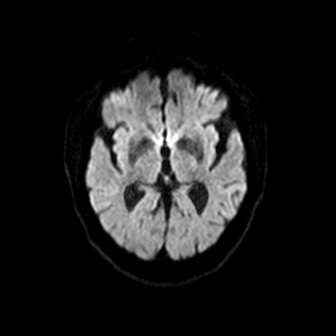
[im 37/56]
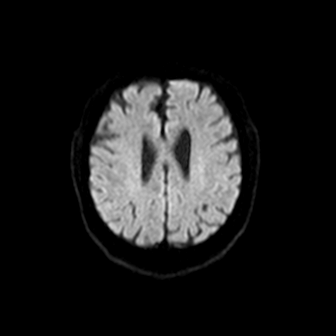
[im 46/56]
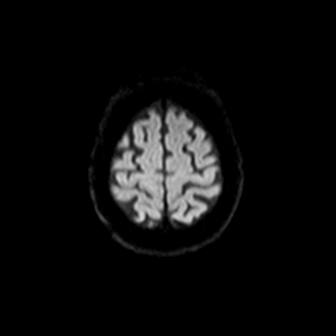
[im 56/56]
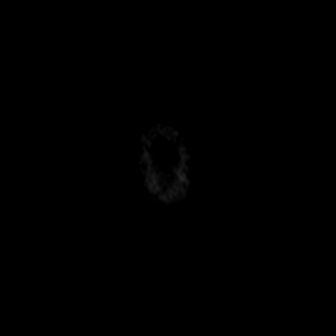

[Series 3: ax dwi_adc · axial · 3.0mm · 0.83mm/px · z∈[-27,+135]mm · 8 of 56 slices shown]
[im 1/56]
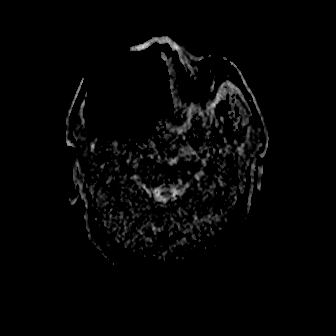
[im 8/56]
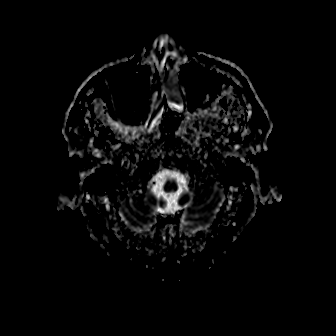
[im 16/56]
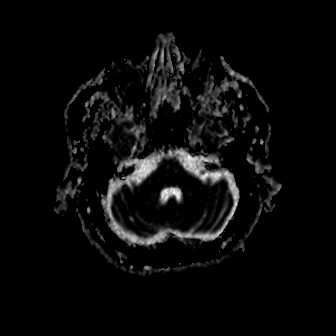
[im 24/56]
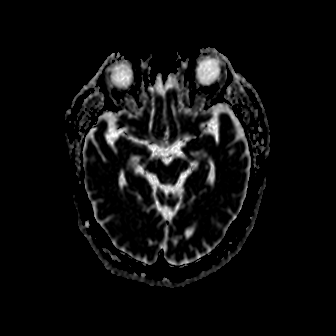
[im 32/56]
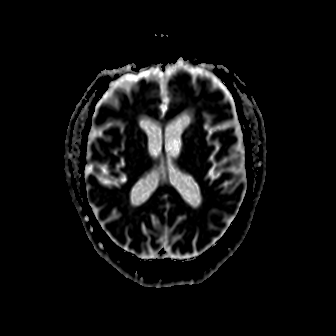
[im 40/56]
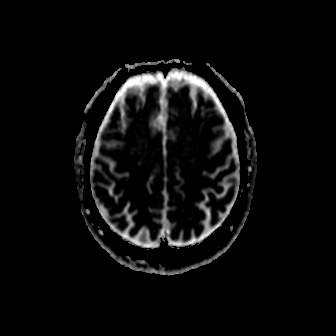
[im 48/56]
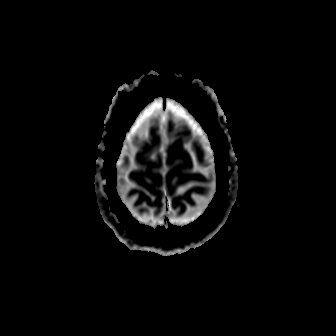
[im 56/56]
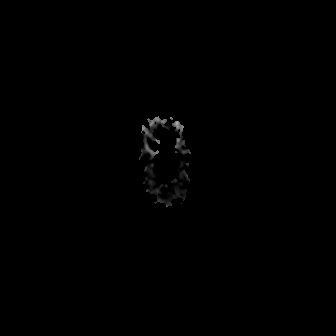

[Series 4: cor dwi_tracew · coronal · 5.0mm · 0.68mm/px · 5 of 38 slices shown]
[im 1/38]
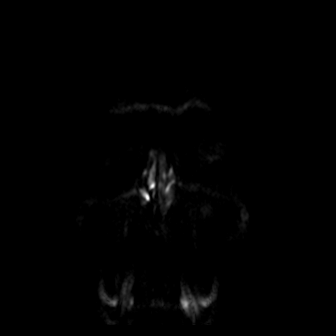
[im 10/38]
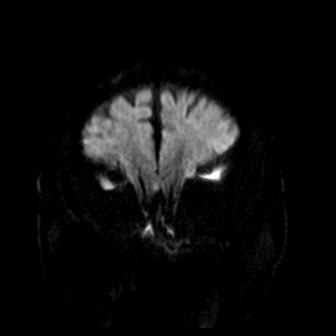
[im 19/38]
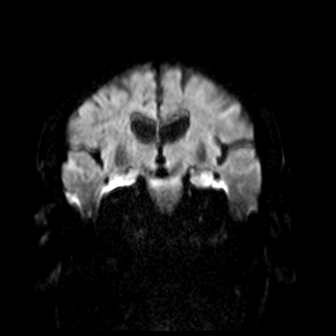
[im 28/38]
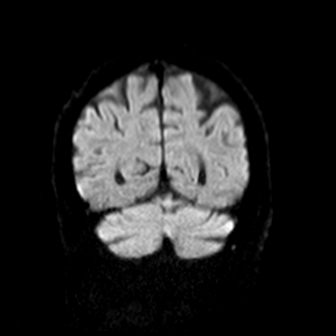
[im 38/38]
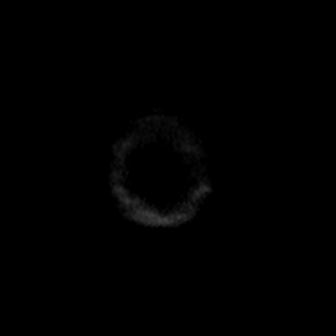

[Series 5: cor dwi_adc · coronal · 5.0mm · 0.68mm/px · 3 of 38 slices shown]
[im 1/38]
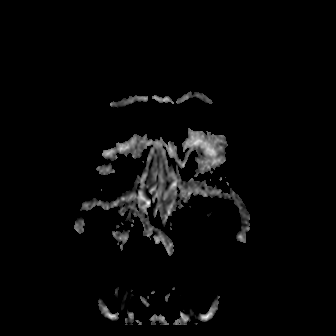
[im 10/38]
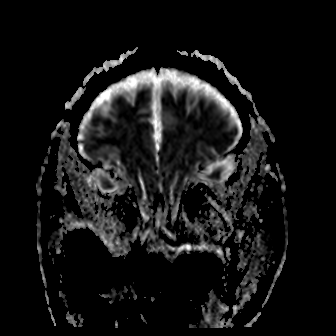
[im 19/38]
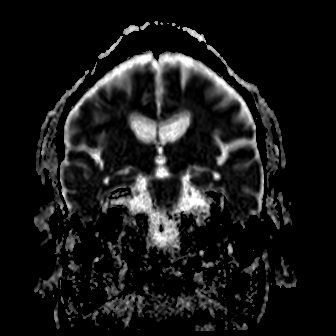

[Series 6: T1 · sagittal · 5.0mm · 0.94mm/px · 3 of 23 slices shown (1 of 2)]
[im 1/23]
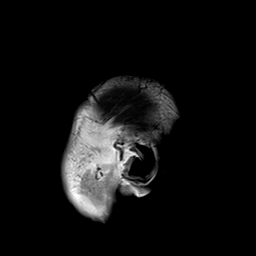
[im 12/23]
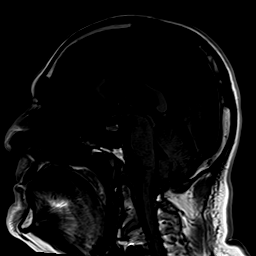
[im 23/23]
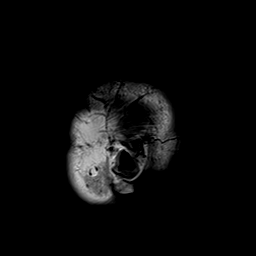

[Series 7: T2 · axial · 5.0mm · 0.45mm/px · z∈[-27,+126]mm · 4 of 27 slices shown (1 of 2)]
[im 1/27]
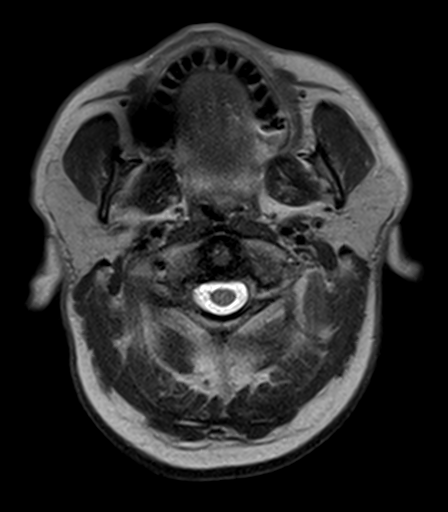
[im 9/27]
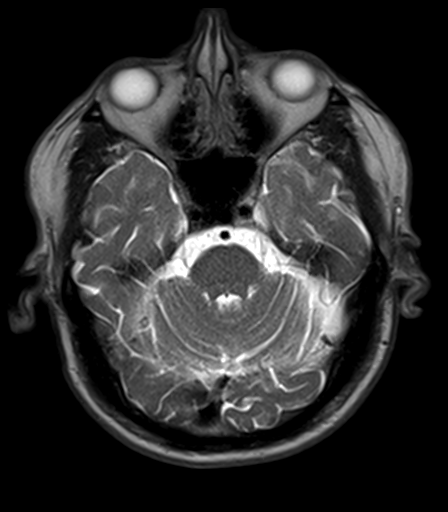
[im 18/27]
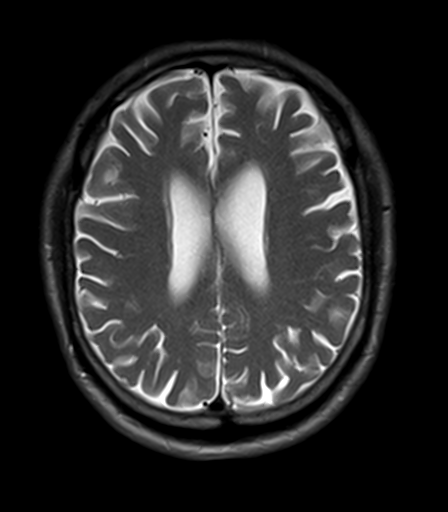
[im 27/27]
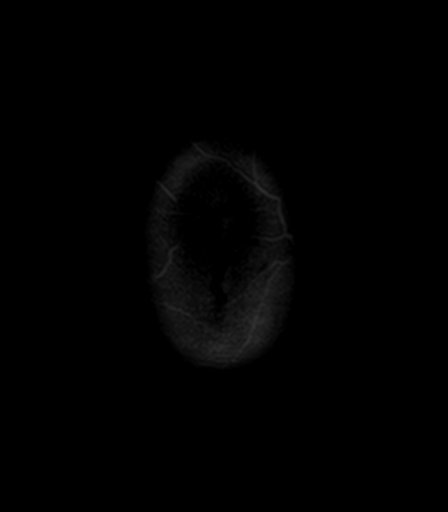

[Series 9: FLAIR · axial · 5.0mm · 1.20mm/px · z∈[-27,+126]mm · 4 of 27 slices shown]
[im 1/27]
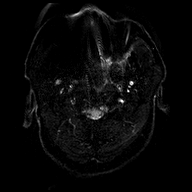
[im 9/27]
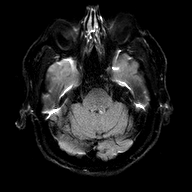
[im 18/27]
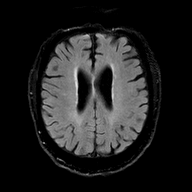
[im 27/27]
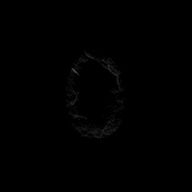

[Series 10: T1 · axial · 5.0mm · 0.90mm/px · z∈[-27,+126]mm · 4 of 27 slices shown (2 of 2)]
[im 1/27]
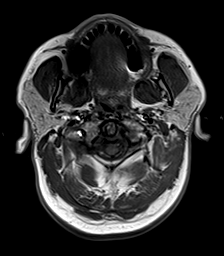
[im 9/27]
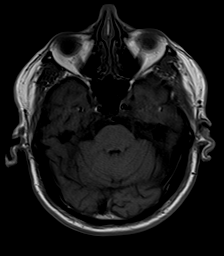
[im 18/27]
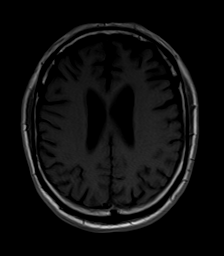
[im 27/27]
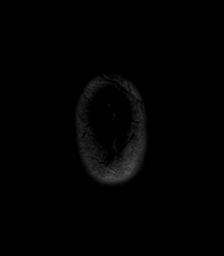

[Series 11: T2 · coronal · 5.0mm · 0.45mm/px · 4 of 31 slices shown (2 of 2)]
[im 1/31]
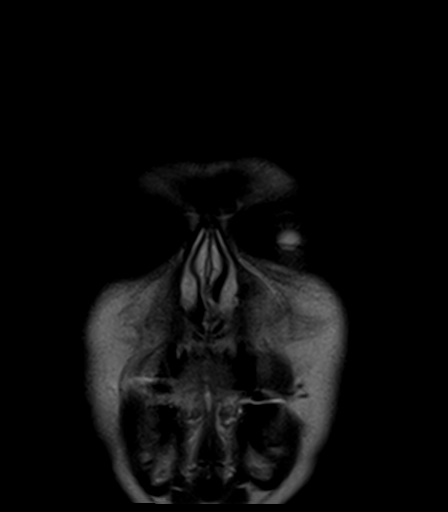
[im 11/31]
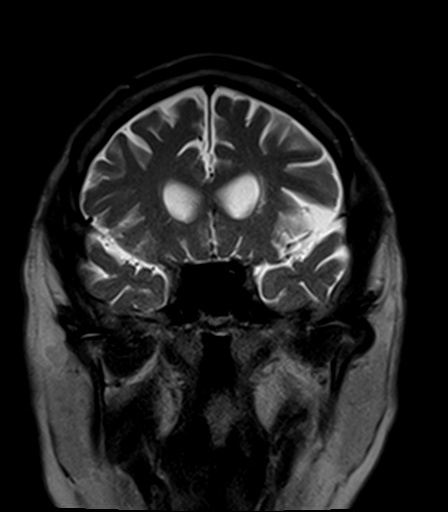
[im 21/31]
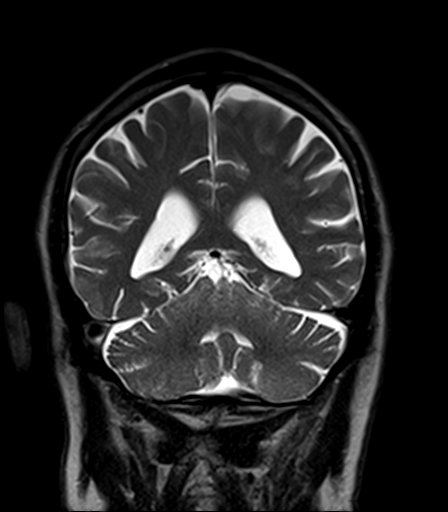
[im 31/31]
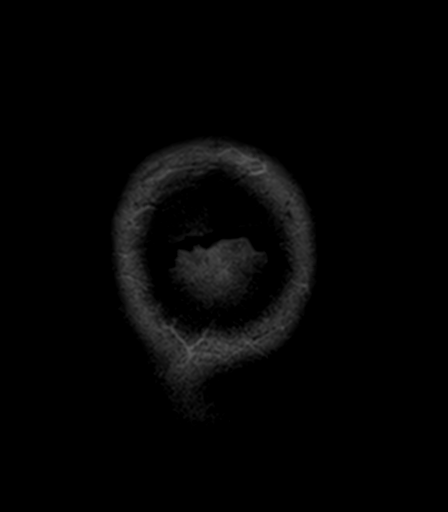

[42 of 48 positions shown; findings below may reference images not displayed]

FINDINGS: BRAIN: There is no acute infarct, acute hemorrhage or extra-axial
collection. Multifocal white matter hyperintensity, most commonly
due to chronic ischemic microangiopathy. There is generalized
atrophy without lobar predilection. The midline structures are
normal. There is an old right cerebellar small vessel infarct.

VASCULAR: The major intracranial arterial and venous sinus flow
voids are normal. Susceptibility-sensitive sequences show no chronic
microhemorrhage or superficial siderosis.

SKULL AND UPPER CERVICAL SPINE: Calvarial bone marrow signal is
normal. There is no skull base mass. The visualized upper cervical
spine and soft tissues are normal.

SINUSES/ORBITS: There are no fluid levels or advanced mucosal
thickening. The mastoid air cells and middle ear cavities are free
of fluid. The orbits are normal.
IMPRESSION: 1. No acute intracranial abnormality.
2. Generalized atrophy and chronic ischemic microangiopathy.
3. Old right cerebellar small vessel infarct.

## 2021-07-22 IMAGING — CR DG CHEST 2V
3 series · 3 of 3 positions shown · non-contrast
Comparison: 07/18/2011

CLINICAL DATA: 77-year-old male with a history of syncope

EXAM:
CHEST - 2 VIEW

[chest pa]
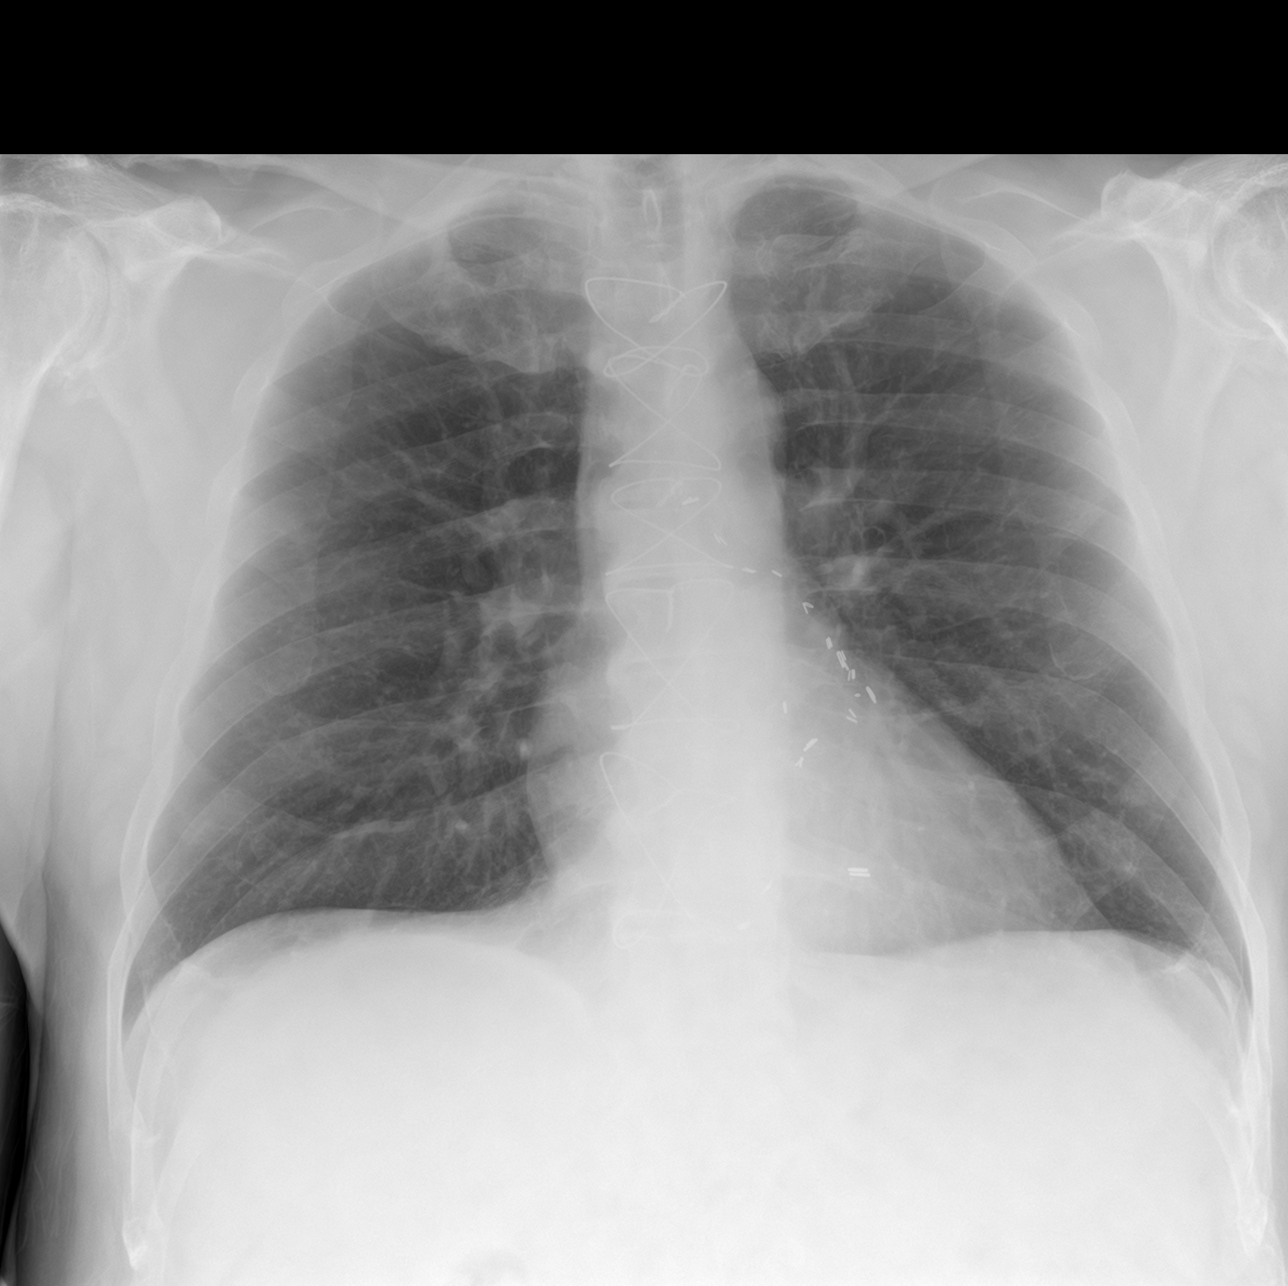

[chest lat (1 of 2)]
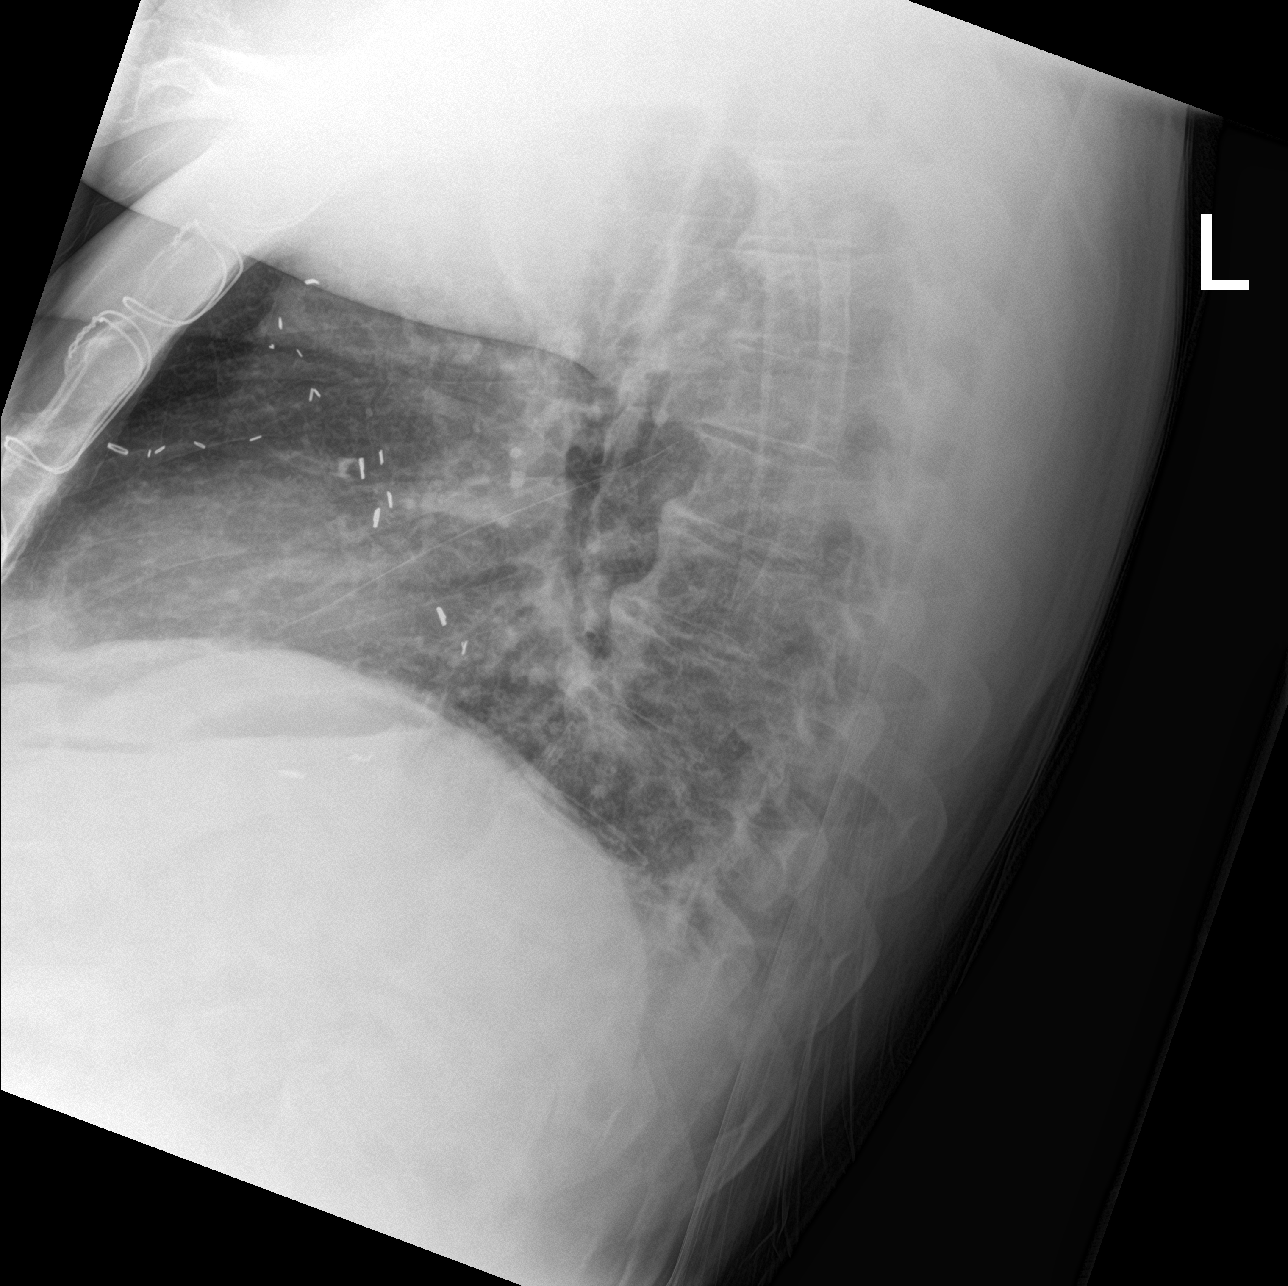

[chest lat (2 of 2)]
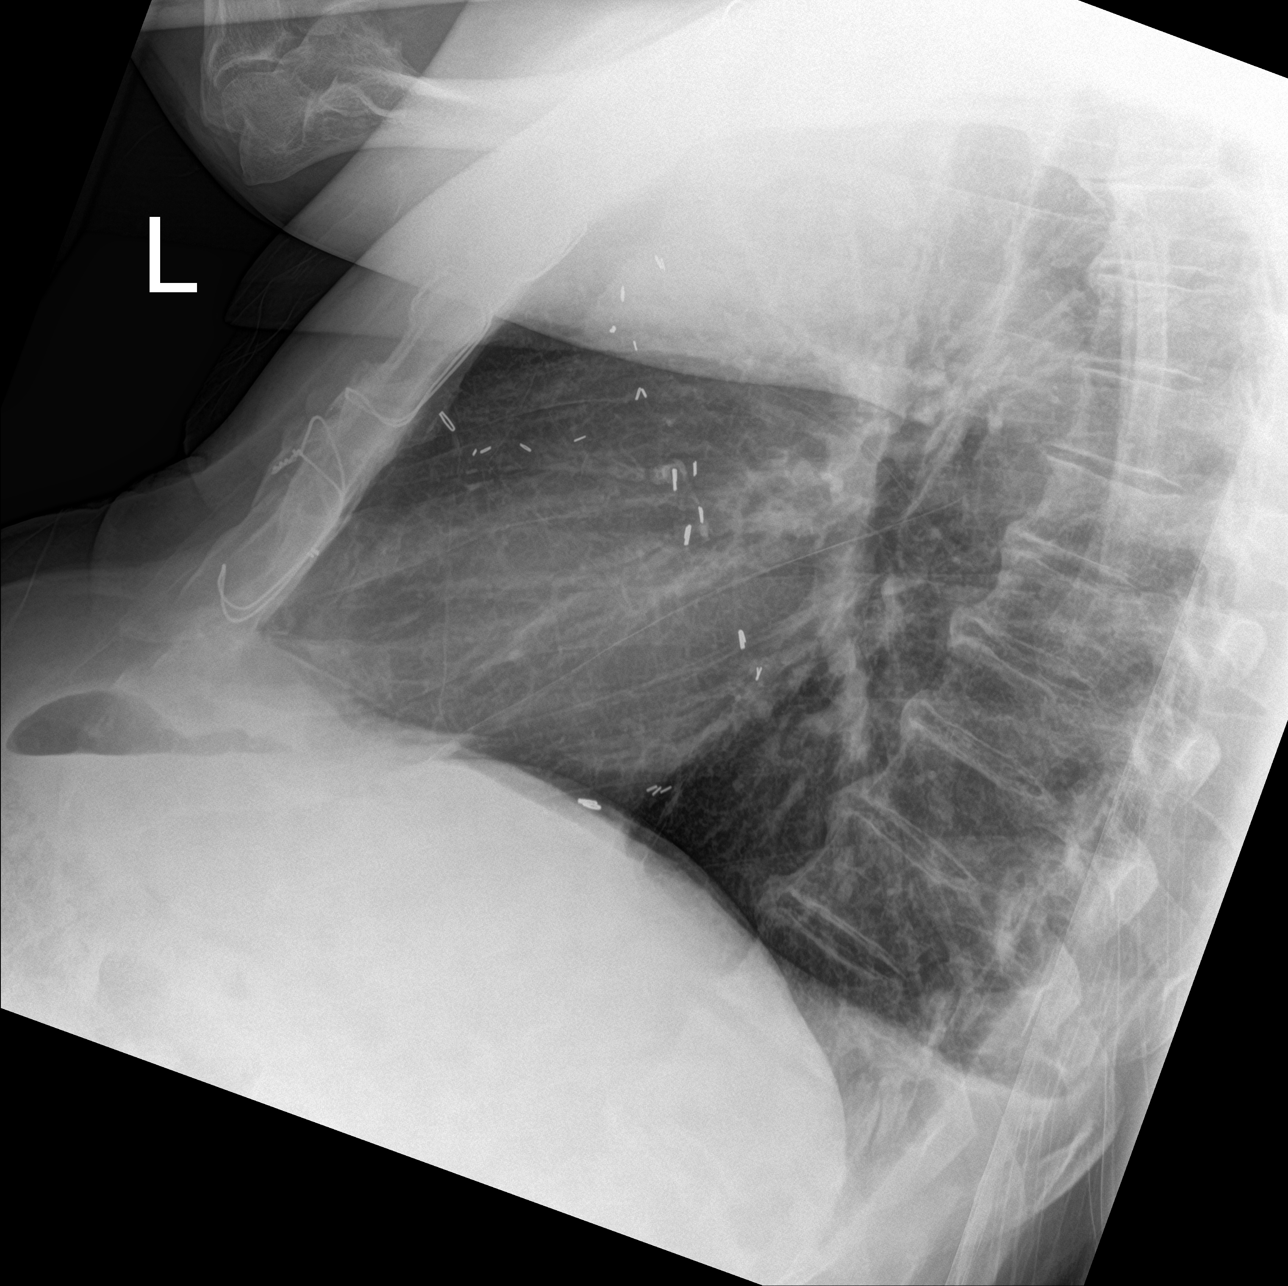

[3 of 3 positions shown; findings below may reference images not displayed]

FINDINGS: Cardiomediastinal silhouette unchanged in size and contour with
surgical changes of median sternotomy and CABG.

No central vascular congestion. No pneumothorax or pleural effusion.
No confluent airspace disease. Coarsened interstitial markings
similar to the prior plain film.

Stigmata of emphysema, with increased retrosternal airspace,
flattened hemidiaphragms, increased AP diameter, and hyperinflation
on the AP view.

No displaced fracture with degenerative changes of the spine
IMPRESSION: Chronic lung changes and emphysema with no evidence of acute
cardiopulmonary disease

Surgical changes of median sternotomy and CABG

## 2022-04-10 ENCOUNTER — Other Ambulatory Visit: Payer: Self-pay

## 2022-04-10 MED ORDER — PERMETHRIN 5 % EX CREA: TOPICAL_CREAM | CUTANEOUS | 1 refills | Status: DC

## 2022-11-18 ENCOUNTER — Observation Stay
Admission: EM | Admit: 2022-11-18 | Discharge: 2022-11-19 | Disposition: A | Discharge status: Home / Self Care | Payer: Medicare Other | Attending: Student | Admitting: Student

## 2022-11-18 ENCOUNTER — Observation Stay: Payer: Medicare Other

## 2022-11-18 ENCOUNTER — Emergency Department: Payer: Medicare Other

## 2022-11-18 DIAGNOSIS — Z794 Long term (current) use of insulin: Secondary | ICD-10-CM | POA: Insufficient documentation

## 2022-11-18 DIAGNOSIS — Z951 Presence of aortocoronary bypass graft: Secondary | ICD-10-CM | POA: Diagnosis not present

## 2022-11-18 DIAGNOSIS — R531 Weakness: Secondary | ICD-10-CM | POA: Insufficient documentation

## 2022-11-18 DIAGNOSIS — E58 Dietary calcium deficiency: Secondary | ICD-10-CM | POA: Insufficient documentation

## 2022-11-18 DIAGNOSIS — R55 Syncope and collapse: Secondary | ICD-10-CM | POA: Diagnosis not present

## 2022-11-18 DIAGNOSIS — E1140 Type 2 diabetes mellitus with diabetic neuropathy, unspecified: Secondary | ICD-10-CM | POA: Diagnosis present

## 2022-11-18 DIAGNOSIS — E162 Hypoglycemia, unspecified: Secondary | ICD-10-CM | POA: Diagnosis not present

## 2022-11-18 DIAGNOSIS — I2510 Atherosclerotic heart disease of native coronary artery without angina pectoris: Secondary | ICD-10-CM | POA: Diagnosis not present

## 2022-11-18 DIAGNOSIS — Z79899 Other long term (current) drug therapy: Secondary | ICD-10-CM | POA: Diagnosis not present

## 2022-11-18 DIAGNOSIS — Z8673 Personal history of transient ischemic attack (TIA), and cerebral infarction without residual deficits: Secondary | ICD-10-CM | POA: Diagnosis not present

## 2022-11-18 DIAGNOSIS — E785 Hyperlipidemia, unspecified: Secondary | ICD-10-CM | POA: Diagnosis present

## 2022-11-18 DIAGNOSIS — R001 Bradycardia, unspecified: Secondary | ICD-10-CM | POA: Diagnosis present

## 2022-11-18 DIAGNOSIS — Z7982 Long term (current) use of aspirin: Secondary | ICD-10-CM | POA: Insufficient documentation

## 2022-11-18 DIAGNOSIS — E11649 Type 2 diabetes mellitus with hypoglycemia without coma: Secondary | ICD-10-CM | POA: Diagnosis not present

## 2022-11-18 DIAGNOSIS — E039 Hypothyroidism, unspecified: Secondary | ICD-10-CM | POA: Diagnosis not present

## 2022-11-18 DIAGNOSIS — R29898 Other symptoms and signs involving the musculoskeletal system: Secondary | ICD-10-CM

## 2022-11-18 DIAGNOSIS — I10 Essential (primary) hypertension: Secondary | ICD-10-CM | POA: Diagnosis present

## 2022-11-18 LAB — COMPREHENSIVE METABOLIC PANEL
ALT: 27 U/L (ref 0–44)
AST: 30 U/L (ref 15–41)
Albumin: 3.4 g/dL — ABNORMAL LOW (ref 3.5–5.0)
Alkaline Phosphatase: 88 U/L (ref 38–126)
Anion gap: 8 (ref 5–15)
BUN: 33 mg/dL — ABNORMAL HIGH (ref 8–23)
CO2: 24 mmol/L (ref 22–32)
Calcium: 7.9 mg/dL — ABNORMAL LOW (ref 8.9–10.3)
Chloride: 107 mmol/L (ref 98–111)
Creatinine, Ser: 1.36 mg/dL — ABNORMAL HIGH (ref 0.61–1.24)
GFR, Estimated: 52 mL/min — ABNORMAL LOW (ref >60)
Glucose, Bld: 58 mg/dL — ABNORMAL LOW (ref 70–99)
Potassium: 3.8 mmol/L (ref 3.5–5.1)
Sodium: 139 mmol/L (ref 135–145)
Total Bilirubin: 0.6 mg/dL (ref 0.3–1.2)
Total Protein: 6 g/dL — ABNORMAL LOW (ref 6.5–8.1)

## 2022-11-18 LAB — CBC WITH DIFFERENTIAL/PLATELET
Abs Immature Granulocytes: 0.02 10*3/uL (ref 0.00–0.07)
Basophils Absolute: 0 10*3/uL (ref 0.0–0.1)
Basophils Relative: 1 %
Eosinophils Absolute: 0.2 10*3/uL (ref 0.0–0.5)
Eosinophils Relative: 3 %
HCT: 37.8 % — ABNORMAL LOW (ref 39.0–52.0)
Hemoglobin: 12.4 g/dL — ABNORMAL LOW (ref 13.0–17.0)
Immature Granulocytes: 0 %
Lymphocytes Relative: 30 %
Lymphs Abs: 2 10*3/uL (ref 0.7–4.0)
MCH: 30.8 pg (ref 26.0–34.0)
MCHC: 32.8 g/dL (ref 30.0–36.0)
MCV: 93.8 fL (ref 80.0–100.0)
Monocytes Absolute: 0.7 10*3/uL (ref 0.1–1.0)
Monocytes Relative: 11 %
Neutro Abs: 3.7 10*3/uL (ref 1.7–7.7)
Neutrophils Relative %: 55 %
Platelets: 123 10*3/uL — ABNORMAL LOW (ref 150–400)
RBC: 4.03 MIL/uL — ABNORMAL LOW (ref 4.22–5.81)
RDW: 12.6 % (ref 11.5–15.5)
WBC: 6.6 10*3/uL (ref 4.0–10.5)
nRBC: 0 % (ref 0.0–0.2)

## 2022-11-18 LAB — PROTIME-INR
INR: 1.3 — ABNORMAL HIGH (ref 0.8–1.2)
Prothrombin Time: 15.6 seconds — ABNORMAL HIGH (ref 11.4–15.2)

## 2022-11-18 LAB — ETHANOL: Alcohol, Ethyl (B): <10 mg/dL (ref <10)

## 2022-11-18 LAB — LACTIC ACID, PLASMA
Lactic Acid, Venous: 1.8 mmol/L (ref 0.5–1.9)
Lactic Acid, Venous: 1.4 mmol/L (ref 0.5–1.9)

## 2022-11-18 LAB — TROPONIN I (HIGH SENSITIVITY)
Troponin I (High Sensitivity): 12 ng/L (ref <18)
Troponin I (High Sensitivity): 11 ng/L (ref <18)

## 2022-11-18 LAB — MAGNESIUM: Magnesium: 2.4 mg/dL (ref 1.7–2.4)

## 2022-11-18 LAB — APTT: aPTT: 32 seconds (ref 24–36)

## 2022-11-18 LAB — D-DIMER, QUANTITATIVE: D-Dimer, Quant: 0.74 ug/mL-FEU — ABNORMAL HIGH (ref 0.00–0.50)

## 2022-11-18 MED ORDER — INSULIN ASPART 100 UNIT/ML IJ SOLN
0.0000 [IU] | Freq: Three times a day (TID) | INTRAMUSCULAR | Status: DC
  Administered 2022-11-19: 2 [IU] via SUBCUTANEOUS
  Filled 2022-11-18: qty 1

## 2022-11-18 MED ORDER — ONDANSETRON HCL 4 MG/2ML IJ SOLN: 4.0000 mg | Freq: Four times a day (QID) | INTRAMUSCULAR | Status: DC | PRN

## 2022-11-18 MED ORDER — GABAPENTIN 300 MG PO CAPS
900.0000 mg | ORAL_CAPSULE | Freq: Two times a day (BID) | ORAL | Status: DC
  Administered 2022-11-19 (×2): 900 mg via ORAL
  Filled 2022-11-18 (×2): qty 3

## 2022-11-18 MED ORDER — ACETAMINOPHEN 650 MG RE SUPP: 650.0000 mg | Freq: Four times a day (QID) | RECTAL | Status: DC | PRN

## 2022-11-18 MED ORDER — INSULIN ASPART 100 UNIT/ML IJ SOLN: 0.0000 [IU] | Freq: Every day | INTRAMUSCULAR | Status: DC

## 2022-11-18 MED ORDER — LISINOPRIL 5 MG PO TABS: 2.5000 mg | ORAL_TABLET | Freq: Every day | ORAL | Status: DC

## 2022-11-18 MED ORDER — SODIUM CHLORIDE 0.9 % IV SOLN: INTRAVENOUS | Status: DC

## 2022-11-18 MED ORDER — IOHEXOL 350 MG/ML SOLN
100.0000 mL | Freq: Once | INTRAVENOUS | Status: AC | PRN
  Administered 2022-11-19: 100 mL via INTRAVENOUS

## 2022-11-18 MED ORDER — ACETAMINOPHEN 325 MG PO TABS: 650.0000 mg | ORAL_TABLET | Freq: Four times a day (QID) | ORAL | Status: DC | PRN

## 2022-11-18 MED ORDER — FUROSEMIDE 40 MG PO TABS: 60.0000 mg | ORAL_TABLET | Freq: Every day | ORAL | Status: DC

## 2022-11-18 MED ORDER — ONDANSETRON HCL 4 MG PO TABS: 4.0000 mg | ORAL_TABLET | Freq: Four times a day (QID) | ORAL | Status: DC | PRN

## 2022-11-18 MED ORDER — LEVOTHYROXINE SODIUM 88 MCG PO TABS
88.0000 ug | ORAL_TABLET | Freq: Every day | ORAL | Status: DC
  Administered 2022-11-19: 88 ug via ORAL
  Filled 2022-11-18 (×2): qty 1

## 2022-11-18 MED ORDER — DEXTROSE-NACL 5-0.9 % IV SOLN: INTRAVENOUS | Status: AC

## 2022-11-18 MED ORDER — ENOXAPARIN SODIUM 40 MG/0.4ML IJ SOSY
40.0000 mg | PREFILLED_SYRINGE | INTRAMUSCULAR | Status: DC
  Administered 2022-11-19: 40 mg via SUBCUTANEOUS
  Filled 2022-11-18: qty 0.4

## 2022-11-18 NOTE — H&P (Addendum)
History and Physical    Patient: Chris [name] [id number] DOB: Jan 27, 1941 DOA: 11/18/2022 DOS: the patient was seen and examined on 11/18/2022 PCP: Margette Fast, MD  Patient coming from: Home  Chief Complaint:  Chief Complaint  Patient presents with   Loss of Consciousness   HPI: Chris [name] is a 82 y.o. male with medical history significant of hypertension, hyperlipidemia, Dm2, CAD s/p CABG, h/o TIA, apparently was sitting at home when he was witnessed to briefly pass out.  Pt's wife states that his bs was 21.  Pt notes that he dropped a spoon out of his right hand.  Pt denies fever, chills, cp, palp, sob, n/v, abd pain, diarrhea, brbpr, black stool, dysuria, hematuria, focal weakness, incontinence, tongue lac, seizure activity.  Pt states that he has had syncope in the past.    In Ed, T 97.9, P 63, Bp 124/56  pox 60% on RA, 100% on RA  Na 139, K 3.8, Bun 33, Creatinine 1.36 Ast 30, Alt 27  Wbc 6.6, hgb 12.4, Plt 123   D dimer 0.74  CT brain  FINDINGS: Brain: No intracranial hemorrhage, mass effect, or evidence of acute infarct. No hydrocephalus. No extra-axial fluid collection. Generalized cerebral atrophy. Ill-defined hypoattenuation within the cerebral white matter is nonspecific but consistent with chronic small vessel ischemic disease.   Vascular: No hyperdense vessel. Intracranial arterial calcification.   Skull: No fracture or focal lesion.   Sinuses/Orbits: No acute finding.   Other: None.   IMPRESSION: 1. No evidence of acute intracranial abnormality. 2. 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  Pt will be admitted for w/up of syncope likely related to hypoglycemia   Review of Systems: negative for all 10 organ systems except for + above   Past Medical History:  Diagnosis Date   Coronary artery disease 2006   4-vessel CABG   Hyperlipidemia    Hypertension    TIA (transient ischemic attack)    Type 2 diabetes mellitus  ([place])    Past Surgical History:  Procedure Laterality Date   BYPASS GRAFT  2006   4-vessel   TOE AMPUTATION Left    Social History:  reports that he has never smoked. He has never used smokeless tobacco. He reports that he does not currently use alcohol. He reports that he does not use drugs.  Allergies  Allergen Reactions   No Known Allergies     Family History  Problem Relation Age of Onset   Seizures Brother    Diabetes Mother    Heart disease Sister     Prior to Admission medications   Medication Sig Start Date End Date Taking? Authorizing Provider  aspirin EC 81 MG tablet Take 81 mg by mouth daily.    [provider]  atorvastatin (LIPITOR) 40 MG tablet Take 40 mg by mouth daily at 6 PM.  08/11/18   [provider]  cetirizine (ZYRTEC) 10 MG tablet Take 10 mg by mouth daily.    [provider]  co-enzyme Q-10 30 MG capsule Take 30 mg by mouth daily.    [provider]  Cyanocobalamin (B-12) 2500 MCG TABS Take 1 tablet by mouth daily. 07/20/19   Gouru, Illene Silver, MD  furosemide (LASIX) 40 MG tablet TAKE 1 TABLET(40 MG) BY MOUTH EVERY DAY 09/22/18   [provider]  gabapentin (NEURONTIN) 300 MG capsule TAKE 4 CAPSULES(1200 MG) BY MOUTH TWICE DAILY 09/22/18   [provider]  insulin lispro protamine-lispro (HUMALOG 75/25 MIX) (75-25) 100  UNIT/ML SUSP injection Inject 30 Units into the skin 2 (two) times daily. 07/20/19   Nicholes Mango, MD  isosorbide mononitrate (IMDUR) 30 MG 24 hr tablet TK 1-2 TS PO D. TK 30 MG D. CAN INCREASE TO 60 MG D IF SYMPTOMS DO NOT RESOLVE 09/21/18   [provider]  levothyroxine (SYNTHROID, LEVOTHROID) 88 MCG tablet TAKE 1 TABLET BY MOUTH DAILY AT 6 AM 10/29/18   [provider]  lidocaine (XYLOCAINE) 5 % ointment Apply 1 application topically as needed. 09/23/19   Gardiner Barefoot, DPM  losartan (COZAAR) 25 MG tablet Take 25 mg by mouth daily.     [provider]  Multiple  Vitamins-Minerals (MULTIVITAMIN WITH MINERALS) tablet Take 1 tablet by mouth daily.     [provider]  omega-3 acid ethyl esters (LOVAZA) 1 g capsule Take 1 g by mouth 2 (two) times daily.    [provider]  pentoxifylline (TRENTAL) 400 MG CR tablet TAKE 1 TABLET(400 MG) BY MOUTH TWICE DAILY 07/29/18   [provider]  permethrin (ELIMITE) 5 % cream Apply from neck down, in between toes and fingers, leave on overnight. Wash off in am and repeat in 1 week. 04/10/22   Moye, [place], MD    Physical Exam: Vitals:   11/18/22 1956 11/18/22 1959 11/18/22 2000  BP:  (!) 124/56   Pulse:  63   Resp:  20   Temp:  97.9 F (36.6 C)   TempSrc:  Oral   SpO2: (!) 60% 100%   Weight:   102.1 kg  Height:   '6\' 1"'$  (1.854 m)   Heent: anicteric, pupils 1.24m symmetric, direct, consensual, near intact , eomi Neck: no jvd, no bruit Heart: rrr s1, s2, no m/g/r, midline scar Lung: CTAB Abd: soft, obese, nt, nd, +bs Ext: no c/c/e,  Skin : no rash Lymph: no adenopathy Neuro: no resting tremor, cn2-12 intact, reflexes 2+ symmetric, diffuse with no clonus, motor 5/5 in all 4 ext, pinprik intact   Data Reviewed:  Assessment and Plan:  Syncope likely related to hypoglycemia Tele MRI brain  Check carotid uKoreaCheck cardiac echo Check CTA chest r/o PE  Dm2 Fsbs ac and qhs, ISS  Hypoglycemia D5ns at 39mper hour x 10 hrs Hold oral agents for now, along with his regular insulin regimen  Hypertension , Hyperlipidemia, CAD s/p CABG Cont lisinopril 2.'5mg'$  po qday Hold Metoprolol XL 12.'5mg'$  po qday (due to bradycardia ?) Cont Lipitor '80mg'$  po qhs  DVT prophylaxis: lovenox FULL CODE Dispo: home  Pt will be admitted observation < 2 nites stay for evaluation of syncope, bradycardia       Advance Care Planning:   Code Status: Prior FULL CODE  Consults: none  Family Communication: w wife  Severity of Illness: The appropriate patient status for this patient is  OBSERVATION. Observation status is judged to be reasonable and necessary in order to provide the required intensity of service to ensure the patient's safety. The patient's presenting symptoms, physical exam findings, and initial radiographic and laboratory data in the context of their medical condition is felt to place them at decreased risk for further clinical deterioration. Furthermore, it is anticipated that the patient will be medically stable for discharge from the hospital within 2 midnights of admission.   Author: JaJani GravelMD 11/18/2022 11:15 PM  For on call review [email or web address]

## 2022-11-18 NOTE — ED Triage Notes (Signed)
Pt bib ems from home. Wife states pt was eating and went completely unresponsive. When ems arrived they said his oxygen was 60% RA. Pt awake and oriented upon arrival to ed but still complaining of weakness/dizziness. Cbg 79 on scene with ems. Ems administered 1538m ns in route.

## 2022-11-18 NOTE — ED Provider Notes (Addendum)
[facility] Provider Note    Event Date/Time   First MD Initiated Contact with Patient 11/18/22 2001     (approximate)   History   Loss of Consciousness   HPI  Chris [name] is a 82 y.o. male   Past medical history of insulin-dependent type 2 diabetic, TIA, hypertension hyperlipidemia, CAD who presents to the emergency department with an episode of syncope at dinner.  He was in his regular state of health today and in the last several days no acute illnesses trauma or any other medical complaints when he sat down to have his dinner.  He took his insulin before his meal as he normally does and ate an entire dinner and towards the end of the dinner he noticed that he kept dropping his fork with his right hand, and then the next thing he knows he was in the ambulance.  His wife was present at the time and notes that he dropped his fork several times became pale stared off blankly and closed his eyes and was unresponsive for several minutes.  She called EMS after this and when EMS arrived to evaluate the patient he was still unresponsive they noted some transient periods of apnea as well as bradycardia, hypotension with systolic in the [id number] and they gave 1500 cc normal saline en route but by the time he got to the emergency department he was normotensive responsive normal without any complaints.  When I evaluate the patient he is awake alert pleasant smiling interactive oriented without any complaints.  He denies chest pain headache vision changes and he has no focal neurologic deficits, see physical exam portion.  He states that he has been in his regular state of health and has had no medical complaints recently.  His wife states that he has been doing well and had a similar episode of near syncope/syncope years ago evaluated at our hospital.  No history of seizures  Independent Historian contributed to assessment above: EMS and wife  External Medical Documents Reviewed:  Discharge summary from hospitalization and a subsequent emergency department visit from 2020 for lightheadedness generalized weakness and near syncopal episodes.      Physical Exam   Triage Vital Signs: ED Triage Vitals  Enc Vitals Group     BP 11/18/22 1959 (!) 124/56     Pulse Rate 11/18/22 1959 63     Resp 11/18/22 1959 20     Temp 11/18/22 1959 97.9 F (36.6 C)     Temp Source 11/18/22 1959 Oral     SpO2 11/18/22 1956 (!) 60 %     Weight 11/18/22 2000 225 lb (102.1 kg)     Height 11/18/22 2000 '6\' 1"'$  (1.854 m)     Head Circumference --      Peak Flow --      Pain Score 11/18/22 2000 0     Pain Loc --      Pain Edu? --      Excl. in [place]? --     Most recent vital signs: Vitals:   11/18/22 1956 11/18/22 1959  BP:  (!) 124/56  Pulse:  63  Resp:  20  Temp:  97.9 F (36.6 C)  SpO2: (!) 60% 100%    General: Awake, no distress.  CV:  Good peripheral perfusion.  Resp:  Normal effort.  Abd:  No distention.  Other:  Alert oriented pleasant cooperative no focal neurologic deficits including dysarthria, facial asymmetry, motor or sensory deficits to all extremities,  finger-nose is normal.  Abdomen soft nontender lungs clear to auscultation bilaterally radial pulses intact bilaterally skin appears warm well-perfused and his vital signs are within normal limit.   ED Results / Procedures / Treatments   Labs (all labs ordered are listed, but only abnormal results are displayed) Labs Reviewed  PROTIME-INR - Abnormal; Notable for the following components:      Result Value   Prothrombin Time 15.6 (*)    INR 1.3 (*)    All other components within normal limits  COMPREHENSIVE METABOLIC PANEL - Abnormal; Notable for the following components:   Glucose, Bld 58 (*)    BUN 33 (*)    Creatinine, Ser 1.36 (*)    Calcium 7.9 (*)    Total Protein 6.0 (*)    Albumin 3.4 (*)    GFR, Estimated 52 (*)    All other components within normal limits  CBC WITH DIFFERENTIAL/PLATELET -  Abnormal; Notable for the following components:   RBC 4.03 (*)    Hemoglobin 12.4 (*)    HCT 37.8 (*)    Platelets 123 (*)    All other components within normal limits  D-DIMER, QUANTITATIVE - Abnormal; Notable for the following components:   D-Dimer, Quant 0.74 (*)    All other components within normal limits  ETHANOL  APTT  MAGNESIUM  LACTIC ACID, PLASMA  LACTIC ACID, PLASMA  CBC  DIFFERENTIAL  URINE DRUG SCREEN, QUALITATIVE ([facility] ONLY)  URINALYSIS, ROUTINE W REFLEX MICROSCOPIC  TROPONIN I (HIGH SENSITIVITY)  TROPONIN I (HIGH SENSITIVITY)     I ordered and reviewed the above labs they are notable for CT of the head and see no obvious bleeding or midline shift  EKG  ED ECG REPORT I, Lucillie Garfinkel, the attending physician, personally viewed and interpreted this ECG.   Date: 11/18/2022  EKG Time: 1957  Rate: 68  Rhythm: nsr  Axis: nl  Intervals: incomplete LBBB  ST&T Change: no ischemic changes    RADIOLOGY I independently reviewed and interpreted CT scan of the head and see no obvious bleeding or midline shift   PROCEDURES:  Critical Care performed: No  Procedures   MEDICATIONS ORDERED IN ED: Medications - No data to display  External physician / consultants:  I spoke with hospitalist for admit and regarding care plan for this patient.   IMPRESSION / MDM / ASSESSMENT AND PLAN / ED COURSE  I reviewed the triage vital signs and the nursing notes.                                Patient's presentation is most consistent with acute presentation with potential threat to life or bodily function.  Differential diagnosis includes, but is not limited to, TIA/stroke, intracranial bleeding, ACS, dysrhythmia, electrolyte disturbance, hypoglycemia, seizure   The patient is on the cardiac monitor to evaluate for evidence of arrhythmia and/or significant heart rate changes.  MDM: This is a patient with a syncopal episode that was preceded by perhaps some focal  weakness of his right upper extremity and that he was dropping his fork.  Consider seizure absence, given his awake but unresponsive episode described by wife, dysrhythmias history of cad.  Fortunately he is awake alert no focal deficits currently and given his rapid improvement even if this was a stroke I do not think he is a candidate for thrombolysis at this time given no focal neurologic deficits currently.  Will keep him in  the emergency department on cardiopulmonary monitoring (noted labile heart rates with extreme bradycardic episodes by EMS but none noted here, normal sinus rhythm so far) and get workup including CT head, EKG, troponin, electrolytes, and admission for syncope/TIA workup         FINAL CLINICAL IMPRESSION(S) / ED DIAGNOSES   Final diagnoses:  Syncope and collapse  Weakness of right arm     Rx / DC Orders   ED Discharge Orders     None        Note:  This document was prepared using Dragon voice recognition software and may include unintentional dictation errors.    Lucillie Garfinkel, MD 11/18/22 2025    Lucillie Garfinkel, MD 11/18/22 [id number]

## 2022-11-19 ENCOUNTER — Observation Stay: Payer: Medicare Other

## 2022-11-19 ENCOUNTER — Other Ambulatory Visit: Payer: Self-pay

## 2022-11-19 ENCOUNTER — Observation Stay (HOSPITAL_BASED_OUTPATIENT_CLINIC_OR_DEPARTMENT_OTHER)
Admit: 2022-11-19 | Discharge: 2022-11-19 | Disposition: A | Discharge status: Home / Self Care | Payer: Medicare Other | Attending: Internal Medicine | Admitting: Internal Medicine

## 2022-11-19 DIAGNOSIS — E11649 Type 2 diabetes mellitus with hypoglycemia without coma: Secondary | ICD-10-CM | POA: Diagnosis not present

## 2022-11-19 DIAGNOSIS — Z794 Long term (current) use of insulin: Secondary | ICD-10-CM

## 2022-11-19 DIAGNOSIS — R55 Syncope and collapse: Secondary | ICD-10-CM

## 2022-11-19 LAB — BASIC METABOLIC PANEL
Anion gap: 9 (ref 5–15)
BUN: 29 mg/dL — ABNORMAL HIGH (ref 8–23)
CO2: 26 mmol/L (ref 22–32)
Calcium: 8.6 mg/dL — ABNORMAL LOW (ref 8.9–10.3)
Chloride: 106 mmol/L (ref 98–111)
Creatinine, Ser: 1.11 mg/dL (ref 0.61–1.24)
GFR, Estimated: >60 mL/min (ref >60)
Glucose, Bld: 107 mg/dL — ABNORMAL HIGH (ref 70–99)
Potassium: 4.5 mmol/L (ref 3.5–5.1)
Sodium: 141 mmol/L (ref 135–145)

## 2022-11-19 LAB — CBC
HCT: 39.2 % (ref 39.0–52.0)
Hemoglobin: 12.8 g/dL — ABNORMAL LOW (ref 13.0–17.0)
MCH: 30.5 pg (ref 26.0–34.0)
MCHC: 32.7 g/dL (ref 30.0–36.0)
MCV: 93.6 fL (ref 80.0–100.0)
Platelets: 124 10*3/uL — ABNORMAL LOW (ref 150–400)
RBC: 4.19 MIL/uL — ABNORMAL LOW (ref 4.22–5.81)
RDW: 12.6 % (ref 11.5–15.5)
WBC: 7.1 10*3/uL (ref 4.0–10.5)
nRBC: 0 % (ref 0.0–0.2)

## 2022-11-19 LAB — GLUCOSE, CAPILLARY
Glucose-Capillary: 104 mg/dL — ABNORMAL HIGH (ref 70–99)
Glucose-Capillary: 207 mg/dL — ABNORMAL HIGH (ref 70–99)

## 2022-11-19 LAB — HEMOGLOBIN A1C
Hgb A1c MFr Bld: 6.4 % — ABNORMAL HIGH (ref 4.8–5.6)
Mean Plasma Glucose: 137 mg/dL

## 2022-11-19 LAB — FOLATE: Folate: 24 ng/mL (ref >5.9)

## 2022-11-19 LAB — IRON AND TIBC
Iron: 57 ug/dL (ref 45–182)
Saturation Ratios: 22 % (ref 17.9–39.5)
TIBC: 258 ug/dL (ref 250–450)
UIBC: 201 ug/dL

## 2022-11-19 LAB — ECHOCARDIOGRAM COMPLETE
AR max vel: 2.04 cm2
AV Area VTI: 2.96 cm2
AV Area mean vel: 2.13 cm2
AV Mean grad: 3 mmHg
AV Peak grad: 5.2 mmHg
Ao pk vel: 1.14 m/s
Area-P 1/2: 2.14 cm2
Height: 73 in
S' Lateral: 2.9 cm
Weight: 3600 oz

## 2022-11-19 LAB — VITAMIN D 25 HYDROXY (VIT D DEFICIENCY, FRACTURES): Vit D, 25-Hydroxy: 44.61 ng/mL (ref 30–100)

## 2022-11-19 LAB — VITAMIN B12: Vitamin B-12: 400 pg/mL (ref 180–914)

## 2022-11-19 MED ORDER — LOSARTAN POTASSIUM 25 MG PO TABS
25.0000 mg | ORAL_TABLET | Freq: Every day | ORAL | Status: DC
  Administered 2022-11-19: 25 mg via ORAL
  Filled 2022-11-19: qty 1

## 2022-11-19 MED ORDER — ISOSORBIDE MONONITRATE ER 30 MG PO TB24
30.0000 mg | ORAL_TABLET | Freq: Every day | ORAL | Status: DC
  Administered 2022-11-19: 30 mg via ORAL
  Filled 2022-11-19: qty 1

## 2022-11-19 MED ORDER — FUROSEMIDE 40 MG PO TABS
40.0000 mg | ORAL_TABLET | Freq: Every day | ORAL | Status: DC
  Administered 2022-11-19: 40 mg via ORAL
  Filled 2022-11-19: qty 1

## 2022-11-19 MED ORDER — ATORVASTATIN CALCIUM 20 MG PO TABS: 40.0000 mg | ORAL_TABLET | Freq: Every day | ORAL | Status: DC

## 2022-11-19 MED ORDER — ASPIRIN 81 MG PO TBEC
81.0000 mg | DELAYED_RELEASE_TABLET | Freq: Every day | ORAL | Status: DC
  Administered 2022-11-19: 81 mg via ORAL
  Filled 2022-11-19: qty 1

## 2022-11-19 NOTE — Discharge Summary (Signed)
[facility] Discharge Summary   Patient: Chris [name] [id number]  PCP: Margette Fast, MD  Date of admission: 11/18/2022   Date of discharge:  11/19/2022     Discharge Diagnoses:  Principal Problem:   Syncope Active Problems:   Essential hypertension   HLD (hyperlipidemia)   Hypothyroidism (acquired)   Type 2 diabetes mellitus with diabetic neuropathy, without long-term current use of insulin (HCC)   Bradycardia   Hypoglycemia   Admitted From: Home Disposition:  Home   Recommendations for Outpatient Follow-up:  PCP: inn 1 wk Follow up LABS/TEST:  CBG   Diet recommendation: Carb modified diet  Activity: The patient is advised to gradually reintroduce usual activities, as tolerated  Discharge Condition: stable  Code Status: Full code   History of present illness: As per the H and P dictated on admission Hospital Course:  Darth Crismon is a 82 y.o. male with medical history significant of hypertension, hyperlipidemia, Dm2, CAD s/p CABG, h/o TIA, apparently was sitting at home when he was witnessed to briefly pass out.  Pt's wife states that his bs was 26.  Pt notes that he dropped a spoon out of his right hand.  Pt denies fever, chills, cp, palp, sob, n/v, abd pain, diarrhea, brbpr, black stool, dysuria, hematuria, focal weakness, incontinence, tongue lac, seizure activity.  Pt states that he has had syncope in the past.   In Ed, T 97.9, P 63, Bp 124/56  pox 60% on RA, 100% on RA Na 139, K 3.8, Bun 33, Creatinine 1.36, Ast 30, Alt 27 Wbc 6.6, hgb 12.4, Plt 123 D dimer 0.74 CT brain:  IMPRESSION: 1. No evidence of acute intracranial abnormality. 2. 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 Pt was admitted for w/up of syncope likely related to hypoglycemia  Assessment and Plan:   # Syncope likely related to hypoglycemia Monitor on telemetry, no events.  MRI brain:  No acute intracranial abnormality. Mild age-related cerebral atrophy with  chronic small vessel ischemic disease, with a small remote right cerebellar infarct Carotid US: Minimal amount of bilateral atherosclerotic plaque, left subjectively greater than right, unchanged to slightly progressed compared to the 2020 examination, though again not resulting in a hemodynamically significant narrowing. CTA chest negative for  PE TTE LVEF 55 to 60%, no wall motion abnormality, moderate asymmetric LV hypertrophy of septal segment, grade 2 diastolic dysfunction.  No significant valvular dysfunction. Patient remained asymptomatic, cleared for discharge.  Syncope most likely due to hypoglycemia, blood sugar remained stable and patient remained asymptomatic. # IDDM T2, continue diabetic diet, continue home dose insulin, continue to monitor FSBG and follow with PCP for further management. Hypoglycemia, Resolved D5ns at 60m per hour x 10 hrs. Held oral agents during hospital stay.  Patient was on insulin.  CBG currently stable. # Hypertension , Hyperlipidemia, CAD s/p CABG Resume home medications, patient was advised to monitor BP at home and follow-up with PCP.  Body mass index is 29.69 kg/m.  Nutrition Interventions:  Patient was ambulatory without any assistance. On the day of the discharge the patient's vitals were stable, and no other acute medical condition were reported by patient. the patient was felt safe to be discharge at Home.  Consultants: None Procedures: None  Discharge Exam: General: Appear in no distress, no Rash; Oral Mucosa Clear, moist. Cardiovascular: S1 and S2 Present, no Murmur, Respiratory: normal respiratory effort, Bilateral Air entry present and no Crackles, no wheezes Abdomen: Bowel Sound present, Soft and no tenderness, no hernia Extremities: no  Pedal edema, no calf tenderness Neurology: alert and oriented to time, place, and person affect appropriate.  Filed Weights   11/18/22 2000  Weight: 102.1 kg   Vitals:   11/19/22 0907 11/19/22 1154   BP: (!) 141/61 (!) 101/41  Pulse: 63 62  Resp: 20 (!) 21  Temp: 97.8 F (36.6 C) 97.8 F (36.6 C)  SpO2:      DISCHARGE MEDICATION: Allergies as of 11/19/2022       Reactions   No Known Allergies         Medication List     TAKE these medications    aspirin EC 81 MG tablet Take 81 mg by mouth daily.   atorvastatin 40 MG tablet Commonly known as: LIPITOR Take 40 mg by mouth daily at 6 PM.   B-12 2500 MCG Tabs Take 1 tablet by mouth daily.   cetirizine 10 MG tablet Commonly known as: ZYRTEC Take 10 mg by mouth daily.   co-enzyme Q-10 30 MG capsule Take 30 mg by mouth daily.   furosemide 40 MG tablet Commonly known as: LASIX TAKE 1 TABLET(40 MG) BY MOUTH EVERY DAY   gabapentin 300 MG capsule Commonly known as: NEURONTIN TAKE 4 CAPSULES(1200 MG) BY MOUTH TWICE DAILY   insulin lispro protamine-lispro (75-25) 100 UNIT/ML Susp injection Commonly known as: HUMALOG 75/25 MIX Inject 30 Units into the skin 2 (two) times daily.   isosorbide mononitrate 30 MG 24 hr tablet Commonly known as: IMDUR TK 1-2 TS PO D. TK 30 MG D. CAN INCREASE TO 60 MG D IF SYMPTOMS DO NOT RESOLVE   levothyroxine 88 MCG tablet Commonly known as: SYNTHROID TAKE 1 TABLET BY MOUTH DAILY AT 6 AM   lidocaine 5 % ointment Commonly known as: XYLOCAINE Apply 1 application topically as needed.   losartan 25 MG tablet Commonly known as: COZAAR Take 25 mg by mouth daily.   multivitamin with minerals tablet Take 1 tablet by mouth daily.   omega-3 acid ethyl esters 1 g capsule Commonly known as: LOVAZA Take 1 g by mouth 2 (two) times daily.   pentoxifylline 400 MG CR tablet Commonly known as: TRENTAL TAKE 1 TABLET(400 MG) BY MOUTH TWICE DAILY   permethrin 5 % cream Commonly known as: ELIMITE Apply from neck down, in between toes and fingers, leave on overnight. Wash off in am and repeat in 1 week.       Allergies  Allergen Reactions   No Known Allergies    Discharge  Instructions     Call MD for:  difficulty breathing, headache or visual disturbances   Complete by: As directed    Call MD for:  extreme fatigue   Complete by: As directed    Call MD for:  persistant dizziness or light-headedness   Complete by: As directed    Call MD for:  persistant nausea and vomiting   Complete by: As directed    Call MD for:  severe uncontrolled pain   Complete by: As directed    Call MD for:  temperature >100.4   Complete by: As directed    Diet - low sodium heart healthy   Complete by: As directed    Discharge instructions   Complete by: As directed    F/u with PCP in 1 wk   Increase activity slowly   Complete by: As directed        The results of significant diagnostics from this hospitalization (including imaging, microbiology, ancillary and laboratory) are listed below for  reference.    Significant Diagnostic Studies: ECHOCARDIOGRAM COMPLETE  Result Date: 11/19/2022    ECHOCARDIOGRAM REPORT   Patient Name:   EVERTT GOLBERG Date of Exam: 11/19/2022 Medical Rec #:  [id number]    Height:       73.0 in Accession #:    [id number]   Weight:       225.0 lb Date of Birth:  [birth year]   BSA:          2.262 m Patient Age:    82 years     BP:           149/66 mmHg Patient Gender: M            HR:           62 bpm. Exam Location:  [facility] Procedure: 2D Echo, Cardiac Doppler and Color Doppler Indications:     Syncope R55  History:         Patient has prior history of Echocardiogram examinations, most                  recent 07/19/2019. TIA; Risk Factors:Hypertension and Diabetes.  Sonographer:     Sherrie Sport Referring Phys:  [place] Diagnosing Phys: Nelva Bush MD IMPRESSIONS  1. Left ventricular ejection fraction, by estimation, is 55 to 60%. The left ventricle has normal function. The left ventricle has no regional wall motion abnormalities. There is moderate asymmetric left ventricular hypertrophy of the septal segment. Left ventricular diastolic parameters are  consistent with Grade II diastolic dysfunction (pseudonormalization). Elevated left atrial pressure.  2. Right ventricular systolic function is low normal. The right ventricular size is normal. There is mildly elevated pulmonary artery systolic pressure.  3. Right atrial size was mildly dilated.  4. The mitral valve is grossly normal. Mild mitral valve regurgitation.  5. The aortic valve has an indeterminant number of cusps. There is mild calcification of the aortic valve. There is mild thickening of the aortic valve. Aortic valve regurgitation is not visualized. No aortic stenosis is present.  6. The inferior vena cava is normal in size with <50% respiratory variability, suggesting right atrial pressure of 8 mmHg. FINDINGS  Left Ventricle: Left ventricular ejection fraction, by estimation, is 55 to 60%. The left ventricle has normal function. The left ventricle has no regional wall motion abnormalities. The left ventricular internal cavity size was normal in size. There is  moderate asymmetric left ventricular hypertrophy of the septal segment. Left ventricular diastolic parameters are consistent with Grade II diastolic dysfunction (pseudonormalization). Elevated left atrial pressure. Right Ventricle: The right ventricular size is normal. No increase in right ventricular wall thickness. Right ventricular systolic function is low normal. There is mildly elevated pulmonary artery systolic pressure. The tricuspid regurgitant velocity is 2.69 m/s, and with an assumed right atrial pressure of 8 mmHg, the estimated right ventricular systolic pressure is 123456 mmHg. Left Atrium: Left atrial size was normal in size. Right Atrium: Right atrial size was mildly dilated. Pericardium: There is no evidence of pericardial effusion. Mitral Valve: The mitral valve is grossly normal. Mild mitral valve regurgitation. Tricuspid Valve: The tricuspid valve is not well visualized. Tricuspid valve regurgitation is trivial. Aortic Valve:  The aortic valve has an indeterminant number of cusps. There is mild calcification of the aortic valve. There is mild thickening of the aortic valve. Aortic valve regurgitation is not visualized. No aortic stenosis is present. Aortic valve mean gradient measures 3.0 mmHg. Aortic valve peak gradient measures 5.2 mmHg. Aortic  valve area, by VTI measures 2.96 cm. Pulmonic Valve: The pulmonic valve was not well visualized. Pulmonic valve regurgitation is not visualized. No evidence of pulmonic stenosis. Aorta: The aortic root is normal in size and structure. Pulmonary Artery: The pulmonary artery is not well seen. Venous: The inferior vena cava is normal in size with less than 50% respiratory variability, suggesting right atrial pressure of 8 mmHg. IAS/Shunts: No atrial level shunt detected by color flow Doppler.  LEFT VENTRICLE PLAX 2D LVIDd:         4.00 cm   Diastology LVIDs:         2.90 cm   LV e' medial:    7.07 cm/s LV PW:         1.15 cm   LV E/e' medial:  20.9 LV IVS:        1.50 cm   LV e' lateral:   7.72 cm/s LVOT diam:     2.00 cm   LV E/e' lateral: 19.2 LV SV:         81 LV SV Index:   36 LVOT Area:     3.14 cm  RIGHT VENTRICLE RV Basal diam:  3.30 cm RV Mid diam:    3.50 cm RV S prime:     11.70 cm/s TAPSE (M-mode): 1.8 cm LEFT ATRIUM             Index        RIGHT ATRIUM           Index LA diam:        3.70 cm 1.64 cm/m   RA Area:     18.90 cm LA Vol (A2C):   35.0 ml 15.47 ml/m  RA Volume:   46.40 ml  20.51 ml/m LA Vol (A4C):   47.3 ml 20.91 ml/m LA Biplane Vol: 43.2 ml 19.10 ml/m  AORTIC VALVE AV Area (Vmax):    2.04 cm AV Area (Vmean):   2.13 cm AV Area (VTI):     2.96 cm AV Vmax:           114.00 cm/s AV Vmean:          81.900 cm/s AV VTI:            0.274 m AV Peak Grad:      5.2 mmHg AV Mean Grad:      3.0 mmHg LVOT Vmax:         74.20 cm/s LVOT Vmean:        55.500 cm/s LVOT VTI:          0.258 m LVOT/AV VTI ratio: 0.94  AORTA Ao Root diam: 3.40 cm MITRAL VALVE                TRICUSPID  VALVE MV Area (PHT): 2.14 cm     TR Peak grad:   28.9 mmHg MV Decel Time: 355 msec     TR Vmax:        269.00 cm/s MV E velocity: 148.00 cm/s MV A velocity: 145.00 cm/s  SHUNTS MV E/A ratio:  1.02         Systemic VTI:  0.26 m                             Systemic Diam: 2.00 cm Nelva Bush MD Electronically signed by Nelva Bush MD Signature Date/Time: 11/19/2022/11:03:10 AM    Final    US Carotid Bilateral  Result Date: 11/19/2022  CLINICAL DATA:  Syncopal episode. History of TIA, hypertension, CAD, hyperlipidemia and diabetes. EXAM: BILATERAL CAROTID DUPLEX ULTRASOUND TECHNIQUE: Pearline Cables scale imaging, color Doppler and duplex ultrasound were performed of bilateral carotid and vertebral arteries in the neck. COMPARISON:  07/19/2019 FINDINGS: Criteria: Quantification of carotid stenosis is based on velocity parameters that correlate the residual internal carotid diameter with NASCET-based stenosis levels, using the diameter of the distal internal carotid lumen as the denominator for stenosis measurement. The following velocity measurements were obtained: RIGHT ICA: 96/19 cm/sec CCA: 0000000 cm/sec SYSTOLIC ICA/CCA RATIO:  1.6 ECA: 91 cm/sec LEFT ICA: 111/14 cm/sec CCA: 0000000 cm/sec SYSTOLIC ICA/CCA RATIO:  1.8 ECA: 147 cm/sec RIGHT CAROTID ARTERY: There is a minimal amount of eccentric echogenic plaque within the right carotid bulb (image 15), extending to involve the origin and proximal aspects of the right internal carotid artery (image 22), unchanged to slightly progressed compared to the 2020 examination though again not resulting in elevated peak systolic velocities within the interrogated course of the right internal carotid artery to suggest a hemodynamically significant stenosis. RIGHT VERTEBRAL ARTERY:  Antegrade flow LEFT CAROTID ARTERY: There is a minimal amount of eccentric echogenic plaque within the left carotid bulb (image 46), extending to involve the origin and proximal aspects of the left  internal carotid artery (image 54 and 55), unchanged to slightly progressed compared to the 2020 examination though again not resulting in elevated peak systolic velocities within the interrogated course of the left internal carotid artery to suggest a hemodynamically significant stenosis. LEFT VERTEBRAL ARTERY:  Antegrade flow IMPRESSION: Minimal amount of bilateral atherosclerotic plaque, left subjectively greater than right, unchanged to slightly progressed compared to the 2020 examination, though again not resulting in a hemodynamically significant narrowing. Electronically Signed   By: Sandi Mariscal M.D.   On: 11/19/2022 07:46   MR BRAIN WO CONTRAST  Result Date: 11/19/2022 CLINICAL DATA:  Initial evaluation for mental status change, unknown cause. EXAM: MRI HEAD WITHOUT CONTRAST TECHNIQUE: Multiplanar, multiecho pulse sequences of the brain and surrounding structures were obtained without intravenous contrast. COMPARISON:  Prior CT from 11/18/2022 and MRI from 07/19/2019. FINDINGS: Brain: Mild age-related cerebral atrophy with chronic small vessel ischemic disease. Small remote right cerebellar infarct. No evidence for acute or subacute ischemia. Gray-white matter differentiation otherwise maintained. No acute or chronic intracranial blood products. No mass lesion, midline shift or mass effect. No hydrocephalus or extra-axial fluid collection. Pituitary gland and suprasellar region within normal limits. Tiny para hippocampal cyst noted on the left. No other significant intrinsic temporal lobe abnormality. Vascular: Major intracranial vascular flow voids are maintained. Skull and upper cervical spine: Craniocervical junction within normal limits. Bone marrow signal intensity normal. No scalp soft tissue abnormality. Sinuses/Orbits: Prior bilateral ocular lens replacement. Globes orbital soft tissues demonstrate no acute finding. Paranasal sinuses are largely clear. No significant mastoid effusion. Other:  None. IMPRESSION: 1. No acute intracranial abnormality. 2. Mild age-related cerebral atrophy with chronic small vessel ischemic disease, with a small remote right cerebellar infarct. Electronically Signed   By: Jeannine Boga M.D.   On: 11/19/2022 02:30   CT Angio Chest Pulmonary Embolism (PE) W or WO Contrast  Result Date: 11/19/2022 CLINICAL DATA:  Syncope. EXAM: CT ANGIOGRAPHY CHEST WITH CONTRAST TECHNIQUE: Multidetector CT imaging of the chest was performed using the standard protocol during bolus administration of intravenous contrast. Multiplanar CT image reconstructions and MIPs were obtained to evaluate the vascular anatomy. RADIATION DOSE REDUCTION: This exam was performed according to the departmental dose-optimization program which  includes automated exposure control, adjustment of the mA and/or kV according to patient size and/or use of iterative reconstruction technique. CONTRAST:  144m OMNIPAQUE IOHEXOL 350 MG/ML SOLN COMPARISON:  None Available. FINDINGS: Cardiovascular: There is moderate severity calcification of the aortic arch and descending thoracic aorta, without evidence of aortic aneurysm. Satisfactory opacification of the pulmonary arteries to the segmental level. No evidence of pulmonary embolism. Normal heart size with marked severity coronary artery calcification and coronary artery vascular clips. No pericardial effusion. Mediastinum/Nodes: No enlarged mediastinal, hilar, or axillary lymph nodes. Thyroid gland, trachea, and esophagus demonstrate no significant findings. Lungs/Pleura: Mild right apical and posterior bibasilar scarring and/or atelectasis is seen. There is no evidence of a pleural effusion or pneumothorax. Upper Abdomen: Numerous subcentimeter gallstones are seen within the lumen of an otherwise normal-appearing gallbladder. Musculoskeletal: Multiple sternal wires are noted. Multilevel degenerative changes are seen throughout the thoracic spine. Review of the MIP  images confirms the above findings. IMPRESSION: 1. No evidence of pulmonary embolism. 2. Mild right apical and posterior bibasilar scarring and/or atelectasis. 3. Evidence of prior median sternotomy/CABG. 4. Cholelithiasis. 5. Aortic atherosclerosis. Aortic Atherosclerosis (ICD10-I70.0). Electronically Signed   By: TVirgina NorfolkM.D.   On: 11/19/2022 00:24   CT Head Wo Contrast  Result Date: 11/18/2022 CLINICAL DATA:  Unresponsiveness EXAM: CT HEAD WITHOUT CONTRAST TECHNIQUE: Contiguous axial images were obtained from the base of the skull through the vertex without intravenous contrast. RADIATION DOSE REDUCTION: This exam was performed according to the departmental dose-optimization program which includes automated exposure control, adjustment of the mA and/or kV according to patient size and/or use of iterative reconstruction technique. COMPARISON:  MRI head 07/19/2019 and CT head 07/18/2019 FINDINGS: Brain: No intracranial hemorrhage, mass effect, or evidence of acute infarct. No hydrocephalus. No extra-axial fluid collection. Generalized cerebral atrophy. Ill-defined hypoattenuation within the cerebral white matter is nonspecific but consistent with chronic small vessel ischemic disease. Vascular: No hyperdense vessel. Intracranial arterial calcification. Skull: No fracture or focal lesion. Sinuses/Orbits: No acute finding. Other: None. IMPRESSION: 1. No evidence of acute intracranial abnormality. 2. 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 Electronically Signed   By: TPlacido SouM.D.   On: 11/18/2022 20:29    Microbiology: No results found for this or any previous visit (from the past 240 hour(s)).   Labs: CBC: Recent Labs  Lab 11/18/22 2008 11/19/22 0450  WBC 6.6 7.1  NEUTROABS 3.7  --   HGB 12.4* 12.8*  HCT 37.8* 39.2  MCV 93.8 93.6  PLT 123* [id number]   Basic Metabolic Panel: Recent Labs  Lab 11/18/22 2008 11/19/22 0450  NA 139 141  K 3.8 4.5  CL 107 106   CO2 24 26  GLUCOSE 58* 107*  BUN 33* 29*  CREATININE 1.36* 1.11  CALCIUM 7.9* 8.6*  MG 2.4  --    Liver Function Tests: Recent Labs  Lab 11/18/22 2008  AST 30  ALT 27  ALKPHOS 88  BILITOT 0.6  PROT 6.0*  ALBUMIN 3.4*   No results for input(s): "LIPASE", "AMYLASE" in the last 168 hours. No results for input(s): "AMMONIA" in the last 168 hours. Cardiac Enzymes: No results for input(s): "CKTOTAL", "CKMB", "CKMBINDEX", "TROPONINI" in the last 168 hours. BNP (last 3 results) No results for input(s): "BNP" in the last 8760 hours. CBG: Recent Labs  Lab 11/19/22 0840 11/19/22 1152  GLUCAP 104* 207*    Time spent: 35 minutes  Signed:  DVal Riles [facility] 11/19/2022 12:52 PM

## 2022-11-19 NOTE — Progress Notes (Signed)
*  PRELIMINARY RESULTS* Echocardiogram 2D Echocardiogram has been performed.  Sherrie Sport 11/19/2022, 8:31 AM

## 2023-07-14 ENCOUNTER — Emergency Department: Payer: Medicare Other

## 2023-07-14 ENCOUNTER — Inpatient Hospital Stay: Payer: Medicare Other

## 2023-07-14 ENCOUNTER — Other Ambulatory Visit: Payer: Medicare Other

## 2023-07-14 ENCOUNTER — Inpatient Hospital Stay
Admission: EM | Admit: 2023-07-14 | Discharge: 2023-07-15 | DRG: 101 | Disposition: A | Discharge status: Other Acute Inpatient Hospital | Payer: Medicare Other | Attending: Internal Medicine | Admitting: Internal Medicine

## 2023-07-14 ENCOUNTER — Observation Stay: Payer: Medicare Other

## 2023-07-14 ENCOUNTER — Encounter (HOSPITAL_COMMUNITY): Payer: Self-pay

## 2023-07-14 DIAGNOSIS — Z79899 Other long term (current) drug therapy: Secondary | ICD-10-CM | POA: Diagnosis not present

## 2023-07-14 DIAGNOSIS — G40201 Localization-related (focal) (partial) symptomatic epilepsy and epileptic syndromes with complex partial seizures, not intractable, with status epilepticus: Principal | ICD-10-CM | POA: Diagnosis present

## 2023-07-14 DIAGNOSIS — Z888 Allergy status to other drugs, medicaments and biological substances status: Secondary | ICD-10-CM | POA: Diagnosis not present

## 2023-07-14 DIAGNOSIS — I10 Essential (primary) hypertension: Secondary | ICD-10-CM | POA: Diagnosis present

## 2023-07-14 DIAGNOSIS — I639 Cerebral infarction, unspecified: Principal | ICD-10-CM | POA: Diagnosis present

## 2023-07-14 DIAGNOSIS — Z794 Long term (current) use of insulin: Secondary | ICD-10-CM | POA: Diagnosis not present

## 2023-07-14 DIAGNOSIS — E11621 Type 2 diabetes mellitus with foot ulcer: Secondary | ICD-10-CM | POA: Diagnosis present

## 2023-07-14 DIAGNOSIS — R569 Unspecified convulsions: Secondary | ICD-10-CM | POA: Diagnosis present

## 2023-07-14 DIAGNOSIS — W19XXXA Unspecified fall, initial encounter: Secondary | ICD-10-CM | POA: Diagnosis present

## 2023-07-14 DIAGNOSIS — Z89422 Acquired absence of other left toe(s): Secondary | ICD-10-CM

## 2023-07-14 DIAGNOSIS — Z7982 Long term (current) use of aspirin: Secondary | ICD-10-CM

## 2023-07-14 DIAGNOSIS — E785 Hyperlipidemia, unspecified: Secondary | ICD-10-CM | POA: Diagnosis present

## 2023-07-14 DIAGNOSIS — I2510 Atherosclerotic heart disease of native coronary artery without angina pectoris: Secondary | ICD-10-CM | POA: Diagnosis present

## 2023-07-14 DIAGNOSIS — Z8673 Personal history of transient ischemic attack (TIA), and cerebral infarction without residual deficits: Secondary | ICD-10-CM | POA: Diagnosis not present

## 2023-07-14 DIAGNOSIS — Z79890 Hormone replacement therapy (postmenopausal): Secondary | ICD-10-CM | POA: Diagnosis not present

## 2023-07-14 DIAGNOSIS — Z7984 Long term (current) use of oral hypoglycemic drugs: Secondary | ICD-10-CM

## 2023-07-14 DIAGNOSIS — Z8249 Family history of ischemic heart disease and other diseases of the circulatory system: Secondary | ICD-10-CM

## 2023-07-14 DIAGNOSIS — Z8279 Family history of other congenital malformations, deformations and chromosomal abnormalities: Secondary | ICD-10-CM | POA: Diagnosis not present

## 2023-07-14 DIAGNOSIS — Z951 Presence of aortocoronary bypass graft: Secondary | ICD-10-CM | POA: Diagnosis not present

## 2023-07-14 DIAGNOSIS — L97529 Non-pressure chronic ulcer of other part of left foot with unspecified severity: Secondary | ICD-10-CM | POA: Diagnosis present

## 2023-07-14 DIAGNOSIS — T148XXA Other injury of unspecified body region, initial encounter: Secondary | ICD-10-CM

## 2023-07-14 LAB — COMPREHENSIVE METABOLIC PANEL
ALT: 21 U/L (ref 0–44)
AST: 23 U/L (ref 15–41)
Albumin: 4.2 g/dL (ref 3.5–5.0)
Alkaline Phosphatase: 109 U/L (ref 38–126)
Anion gap: 15 (ref 5–15)
BUN: 41 mg/dL — ABNORMAL HIGH (ref 8–23)
CO2: 24 mmol/L (ref 22–32)
Calcium: 8.9 mg/dL (ref 8.9–10.3)
Chloride: 97 mmol/L — ABNORMAL LOW (ref 98–111)
Creatinine, Ser: 1.43 mg/dL — ABNORMAL HIGH (ref 0.61–1.24)
GFR, Estimated: 49 mL/min — ABNORMAL LOW (ref >60)
Glucose, Bld: 315 mg/dL — ABNORMAL HIGH (ref 70–99)
Potassium: 4.2 mmol/L (ref 3.5–5.1)
Sodium: 136 mmol/L (ref 135–145)
Total Bilirubin: 0.8 mg/dL (ref 0.3–1.2)
Total Protein: 7.6 g/dL (ref 6.5–8.1)

## 2023-07-14 LAB — URINALYSIS, COMPLETE (UACMP) WITH MICROSCOPIC
Bacteria, UA: NONE SEEN
Bilirubin Urine: NEGATIVE
Glucose, UA: >=500 mg/dL — AB
Hgb urine dipstick: NEGATIVE
Ketones, ur: NEGATIVE mg/dL
Leukocytes,Ua: NEGATIVE
Nitrite: NEGATIVE
Protein, ur: NEGATIVE mg/dL
Specific Gravity, Urine: 1.039 — ABNORMAL HIGH (ref 1.005–1.030)
Squamous Epithelial / HPF: 0 /HPF (ref 0–5)
pH: 5 (ref 5.0–8.0)

## 2023-07-14 LAB — CBC WITH DIFFERENTIAL/PLATELET
Abs Immature Granulocytes: 0.04 10*3/uL (ref 0.00–0.07)
Basophils Absolute: 0 10*3/uL (ref 0.0–0.1)
Basophils Relative: 0 %
Eosinophils Absolute: 0.1 10*3/uL (ref 0.0–0.5)
Eosinophils Relative: 1 %
HCT: 40.3 % (ref 39.0–52.0)
Hemoglobin: 13.2 g/dL (ref 13.0–17.0)
Immature Granulocytes: 0 %
Lymphocytes Relative: 14 %
Lymphs Abs: 1.6 10*3/uL (ref 0.7–4.0)
MCH: 30.9 pg (ref 26.0–34.0)
MCHC: 32.8 g/dL (ref 30.0–36.0)
MCV: 94.4 fL (ref 80.0–100.0)
Monocytes Absolute: 0.9 10*3/uL (ref 0.1–1.0)
Monocytes Relative: 7 %
Neutro Abs: 9.4 10*3/uL — ABNORMAL HIGH (ref 1.7–7.7)
Neutrophils Relative %: 78 %
Platelets: 180 10*3/uL (ref 150–400)
RBC: 4.27 MIL/uL (ref 4.22–5.81)
RDW: 12.9 % (ref 11.5–15.5)
WBC: 12 10*3/uL — ABNORMAL HIGH (ref 4.0–10.5)
nRBC: 0 % (ref 0.0–0.2)

## 2023-07-14 LAB — URINE DRUG SCREEN, QUALITATIVE (ARMC ONLY)
Amphetamines, Ur Screen: NOT DETECTED
Barbiturates, Ur Screen: NOT DETECTED
Benzodiazepine, Ur Scrn: NOT DETECTED
Cannabinoid 50 Ng, Ur ~~LOC~~: NOT DETECTED
Cocaine Metabolite,Ur ~~LOC~~: NOT DETECTED
MDMA (Ecstasy)Ur Screen: NOT DETECTED
Methadone Scn, Ur: NOT DETECTED
Opiate, Ur Screen: NOT DETECTED
Phencyclidine (PCP) Ur S: NOT DETECTED
Tricyclic, Ur Screen: NOT DETECTED

## 2023-07-14 LAB — ETHANOL: Alcohol, Ethyl (B): <10 mg/dL (ref <10)

## 2023-07-14 LAB — CBG MONITORING, ED
Glucose-Capillary: 290 mg/dL — ABNORMAL HIGH (ref 70–99)
Glucose-Capillary: 301 mg/dL — ABNORMAL HIGH (ref 70–99)
Glucose-Capillary: 241 mg/dL — ABNORMAL HIGH (ref 70–99)
Glucose-Capillary: 219 mg/dL — ABNORMAL HIGH (ref 70–99)

## 2023-07-14 LAB — PROTIME-INR
INR: 1.1 (ref 0.8–1.2)
Prothrombin Time: 14.2 s (ref 11.4–15.2)

## 2023-07-14 LAB — APTT: aPTT: 29 s (ref 24–36)

## 2023-07-14 MED ORDER — DROPERIDOL 2.5 MG/ML IJ SOLN
2.5000 mg | Freq: Once | INTRAMUSCULAR | Status: AC
  Administered 2023-07-14: 2.5 mg via INTRAVENOUS
  Filled 2023-07-14: qty 2

## 2023-07-14 MED ORDER — IOHEXOL 350 MG/ML SOLN
125.0000 mL | Freq: Once | INTRAVENOUS | Status: AC | PRN
  Administered 2023-07-14: 125 mL via INTRAVENOUS

## 2023-07-14 MED ORDER — ZIPRASIDONE MESYLATE 20 MG IM SOLR
10.0000 mg | Freq: Four times a day (QID) | INTRAMUSCULAR | Status: DC | PRN
  Administered 2023-07-14: 10 mg via INTRAMUSCULAR
  Filled 2023-07-14 (×2): qty 20

## 2023-07-14 MED ORDER — LORAZEPAM 2 MG/ML IJ SOLN
2.0000 mg | INTRAMUSCULAR | Status: DC | PRN
  Filled 2023-07-14: qty 1

## 2023-07-14 MED ORDER — LACOSAMIDE 50 MG PO TABS: 200.0000 mg | ORAL_TABLET | Freq: Two times a day (BID) | ORAL | Status: DC

## 2023-07-14 MED ORDER — HALOPERIDOL LACTATE 5 MG/ML IJ SOLN
5.0000 mg | Freq: Once | INTRAMUSCULAR | Status: AC
  Administered 2023-07-14: 5 mg via INTRAVENOUS
  Filled 2023-07-14: qty 1

## 2023-07-14 MED ORDER — OLANZAPINE 10 MG IM SOLR
5.0000 mg | Freq: Once | INTRAMUSCULAR | Status: DC
  Filled 2023-07-14: qty 10

## 2023-07-14 MED ORDER — INSULIN ASPART 100 UNIT/ML IJ SOLN
0.0000 [IU] | Freq: Every day | INTRAMUSCULAR | Status: DC
  Administered 2023-07-14: 2 [IU] via SUBCUTANEOUS
  Filled 2023-07-14: qty 1

## 2023-07-14 MED ORDER — ASPIRIN 300 MG RE SUPP
300.0000 mg | Freq: Once | RECTAL | Status: AC
  Administered 2023-07-14: 300 mg via RECTAL
  Filled 2023-07-14: qty 1

## 2023-07-14 MED ORDER — ACETAMINOPHEN 325 MG PO TABS: 650.0000 mg | ORAL_TABLET | Freq: Four times a day (QID) | ORAL | Status: DC | PRN

## 2023-07-14 MED ORDER — INSULIN ASPART 100 UNIT/ML IJ SOLN
0.0000 [IU] | Freq: Three times a day (TID) | INTRAMUSCULAR | Status: DC
  Administered 2023-07-14: 11 [IU] via SUBCUTANEOUS
  Administered 2023-07-14: 5 [IU] via SUBCUTANEOUS
  Administered 2023-07-15: 8 [IU] via SUBCUTANEOUS
  Filled 2023-07-14 (×2): qty 1

## 2023-07-14 MED ORDER — ATORVASTATIN CALCIUM 20 MG PO TABS: 40.0000 mg | ORAL_TABLET | Freq: Every day | ORAL | Status: DC

## 2023-07-14 MED ORDER — SODIUM CHLORIDE 0.9 % IV SOLN: INTRAVENOUS | Status: AC

## 2023-07-14 MED ORDER — ACETAMINOPHEN 650 MG RE SUPP: 650.0000 mg | Freq: Four times a day (QID) | RECTAL | Status: DC | PRN

## 2023-07-14 MED ORDER — ENOXAPARIN SODIUM 40 MG/0.4ML IJ SOSY
40.0000 mg | PREFILLED_SYRINGE | INTRAMUSCULAR | Status: DC
  Administered 2023-07-14 – 2023-07-15 (×2): 40 mg via SUBCUTANEOUS
  Filled 2023-07-14 (×2): qty 0.4

## 2023-07-14 MED ORDER — LACOSAMIDE 50 MG PO TABS: 100.0000 mg | ORAL_TABLET | Freq: Two times a day (BID) | ORAL | Status: DC

## 2023-07-14 MED ORDER — GABAPENTIN 400 MG PO CAPS
1200.0000 mg | ORAL_CAPSULE | Freq: Two times a day (BID) | ORAL | Status: DC
Start: 2023-07-14 — End: 2023-07-15
  Filled 2023-07-14 (×2): qty 3

## 2023-07-14 MED ORDER — INSULIN GLARGINE-YFGN 100 UNIT/ML ~~LOC~~ SOLN
20.0000 [IU] | Freq: Every day | SUBCUTANEOUS | Status: DC
  Administered 2023-07-14 – 2023-07-15 (×2): 20 [IU] via SUBCUTANEOUS
  Filled 2023-07-14 (×2): qty 0.2

## 2023-07-14 MED ORDER — LACOSAMIDE 200 MG/20ML IV SOLN
200.0000 mg | Freq: Once | INTRAVENOUS | Status: AC
  Administered 2023-07-14: 200 mg via INTRAVENOUS
  Filled 2023-07-14: qty 20

## 2023-07-14 MED ORDER — ONDANSETRON HCL 4 MG PO TABS: 4.0000 mg | ORAL_TABLET | Freq: Four times a day (QID) | ORAL | Status: DC | PRN

## 2023-07-14 MED ORDER — ONDANSETRON HCL 4 MG/2ML IJ SOLN: 4.0000 mg | Freq: Four times a day (QID) | INTRAMUSCULAR | Status: DC | PRN

## 2023-07-14 MED ORDER — LEVOTHYROXINE SODIUM 88 MCG PO TABS
88.0000 ug | ORAL_TABLET | Freq: Every day | ORAL | Status: DC
  Filled 2023-07-14: qty 1

## 2023-07-14 MED ORDER — LORAZEPAM 2 MG/ML IJ SOLN
1.0000 mg | Freq: Once | INTRAMUSCULAR | Status: AC
  Administered 2023-07-14: 1 mg via INTRAVENOUS
  Filled 2023-07-14: qty 1

## 2023-07-14 NOTE — ED Notes (Signed)
CT of head, face, and C-Spine resulted.  ER MD cleared pt for C-Collar removal.  RN removed C-Collar.

## 2023-07-14 NOTE — ED Notes (Signed)
Patient transported to MRI 

## 2023-07-14 NOTE — ED Notes (Signed)
The wife gave a total of three ice chips and the patient did not do well. By the third one he was choking and spit the liquid from them back up. Coughed for a long time. MD Chipper Herb and Orson Ape SLP made aware

## 2023-07-14 NOTE — ED Notes (Signed)
Code Stroke called to [facility] by EMS /When patient arrived in ER was sent to CT per Dr York Cerise Reinaldo Raddle rt side weakness/ last known well @ 9:00 PM  Pt going to rm 5 after CT

## 2023-07-14 NOTE — ED Notes (Signed)
Neurologist at bedside. 

## 2023-07-14 NOTE — Progress Notes (Signed)
2956 - Code Stroke Activated  mRS unable to assess, LKWT 2100 Patient in CT at time of ealert 0320 - Patient returned to room 0317- Tele-Neurologist paged  [phone] - Dr. Felix Ahmadi on stroke cart  [phone] - CTA/CTP/NCCT results reviewed by Dr. Felix Ahmadi via secure message from [facility]

## 2023-07-14 NOTE — ED Notes (Signed)
Patient taken by MRI tech to MRI, and will proceed to floor after that.

## 2023-07-14 NOTE — ED Notes (Signed)
Assisted pt with using urinal. Pt did not urinate. Cleaned the dried blood off head and L elbow. Applied xeroform gauze to L elbow and wrapped with rolled gauze. Applied padding to bed rails.

## 2023-07-14 NOTE — ED Notes (Signed)
Patient's wife called and asked that any information requested be released to Marcelino Scot, who is the patient's daughter in-law.  She called and asked questions about results of scans, and bloodwork which she stated her mother-in-law did not quite understand.  The family is asking that the patient also be assessed for abdominal pain and an x ray of the left knee be obtained.

## 2023-07-14 NOTE — ED Notes (Signed)
At approximately 0540 RN walked out of room 6 and another RN asked "did you hear that noise?"  This RN checked room 5 and noted pt lying prone in floor.  RN called for help and instructed secretary to call MD.  RN retrieved C-Collar and MD assessed pt.  C-Collar applied and pt log rolled to supine position.  MD finished assessing pt.  Pt was then assisted to standing and into bed.  Pt face was cleaned, obvious laceration dressed.

## 2023-07-14 NOTE — ED Notes (Signed)
RN spoke with pt wife [place] on the phone at [phone].  Pt wife informed that both pt CT's are negative for stroke per ER MD, but ER MD does suspect pt has had a stroke.  Pt wife states she will come to hospital after 0730.

## 2023-07-14 NOTE — Consult Note (Addendum)
Addendum: CTA H/N neg for LVO  [facility] Tele[facility]urology Consult Services   Patient Name:   Chris [name], Chris [name] Date of Birth:   March 16, 1941 Identification Number:   MRN - [id number] Date of Service:   07/14/2023 03:17:28  Diagnosis:       I63.89 - Cerebrovascular accident (CVA) due to other mechanism [facility])  Impression:      82 yo man presenting for weakness and aphasia. Outside the time window for thrombolytics. Differential certainly includes stroke and I recommend obtaining CTA H/N, which I ordered.  This appears neg for LVO on my review though radiology read pending.    If confirmed no LVO, would admit for further workup including brain MRI. Consider EEG. Continue home aspirin for now.      Our recommendations are outlined below.  Recommendations:        Stroke/Telemetry Floor       Neuro Checks       Bedside Swallow Eval       DVT Prophylaxis       IV Fluids, Normal Saline       Head of Bed 30 Degrees       Euglycemia and Avoid Hyperthermia (PRN Acetaminophen)       Initiate or continue Aspirin 81 MG daily  Sign Out:       Discussed with Emergency Department Provider    ------------------------------------------------------------------------------  Advanced Imaging: Advanced imaging has been ordered. Results pending.   Metrics: Last Known Well: 07/13/2023 21:00:00 [facility] Notification Time: 07/14/2023 03:17:28 Arrival Time: 07/14/2023 03:08:00 Stamp Time: 07/14/2023 03:17:28 Initial Response Time: 07/14/2023 03:22:09 Symptoms: aphasia/ams, weakness. Initial patient interaction: 07/14/2023 03:26:21 NIHSS Assessment Completed: 07/14/2023 03:31:54 Patient is not a candidate for Thrombolytic. Thrombolytic Medical Decision: 07/14/2023 03:31:55 Patient was not deemed candidate for Thrombolytic because of following reasons: LKW outside 4.5 hr window. .  CT head showed no acute hemorrhage or acute core infarct.  Primary Provider Notified of Diagnostic  Impression and Management Plan on: 07/14/2023 03:38:28    ------------------------------------------------------------------------------  History of Present Illness: Patient is a 82 year old Male.  Patient was brought by EMS for symptoms of aphasia/ams, weakness. This is an 82 yo man presenting for evaluation of aphasia and right sided weakness. Per report, EMS relayed that his last seen normal was 2100 before he went to sleep.  On exam, he is awake and does follow simple commands. Speech is minimal and quiet however he seems to perseverate and just state his birthday to most questions. No obvious gaze deviation, facial droop, or clear focal weakness. He seems to have global weakness.   Past Medical History:      Hypertension      Diabetes Mellitus      Hyperlipidemia      Coronary Artery Disease      Stroke  Medications:  No Anticoagulant use  Antiplatelet use: Yes aspirin Reviewed EMR for current medications  Allergies:  Reviewed  Social History: Unable To Obtain Due To Patient Status : Patient Cannot Speak  Family History:  Family History Cannot Be Obtained Because:Patient Cannot Speak  ROS : ROS Cannot Be Obtained Because:  Patient Cannot Speak  Past Surgical History: Past Surgical History Cannot Be Obtained Because: Patient Cannot Speak    Examination: BP(162/82), Pulse(74), 1A: Level of Consciousness - Alert; keenly responsive + 0 1B: Ask Month and Age - Aphasic + 2 1C: Blink Eyes & Squeeze Hands - Performs Both Tasks + 0 2: Test Horizontal Extraocular Movements - Normal + 0 3: Test  Visual Fields - No Visual Loss + 0 4: Test Facial Palsy (Use Grimace if Obtunded) - Normal symmetry + 0 5A: Test Left Arm Motor Drift - No Drift for 10 Seconds + 0 5B: Test Right Arm Motor Drift - No Drift for 10 Seconds + 0 6A: Test Left Leg Motor Drift - No Effort Against Gravity + 3 6B: Test Right Leg Motor Drift - No Effort Against Gravity + 3 7: Test Limb Ataxia  (FNF/Heel-Shin) - No Ataxia + 0 8: Test Sensation - Normal; No sensory loss + 0 9: Test Language/Aphasia - Severe Aphasia: Fragmentary Expression, Inference Needed, Cannot Identify Materials + 2 10: Test Dysarthria - Normal + 0 11: Test Extinction/Inattention - No abnormality + 0  NIHSS Score: 10   Pre-Morbid Modified Rankin Scale: Unable to assess  Spoke with : Dr. York Cerise  This consult was conducted in real time using interactive audio and Immunologist. Patient was informed of the technology being used for this visit and agreed to proceed. Patient located in hospital and provider located at home/office setting.   Patient is being evaluated for possible acute neurologic impairment and high probability of imminent or life-threatening deterioration. I spent total of 40 minutes providing care to this patient, including time for face to face visit via telemedicine, review of medical records, imaging studies and discussion of findings with providers, the patient and/or family.   Dr Parthenia Ames   [facility] For Inpatient follow-up with [facility] physician please call [facility] [phone]. This is not an outpatient service. Post hospital discharge, please contact hospital directly.  Please do not communicate with [facility] physicians via secure chat. If you have any questions, Please contact [facility]. Please call or reconsult our service if there are any clinical or diagnostic changes.

## 2023-07-14 NOTE — Procedures (Signed)
Routine EEG Report  Chris [name] is a 82 y.o. male with a history of seizures who is undergoing an EEG to evaluate for seizures.  Report: This EEG was acquired with electrodes placed according to the International 10-20 electrode system (including Fp1, Fp2, F3, F4, C3, C4, P3, P4, O1, O2, T3, T4, T5, T6, A1, A2, Fz, Cz, Pz). The following electrodes were missing or displaced: none.  The occipital dominant rhythm was 7 Hz with overriding beta frequencies. This activity is reactive to stimulation. Drowsiness was manifested by background fragmentation; deeper stages of sleep were identified by K complexes and sleep spindles. There was left focal slowing. There were no interictal epileptiform discharges. There were no electrographic seizures identified. Photic stimulation and hyperventilation were not performed.   Impression and clinical correlation: This EEG was obtained while awake and asleep and is abnormal due to: - mild diffuse slowing indicative of global cerebral dysfunction - left focal slowing indicative of superimposed focal cerebral dysfunction in that area  Epileptiform abnormalities were not seen during this recording.  Bing Neighbors, MD [facility] [phone]  If 7pm- 7am, please page neurology on call as listed in [facility].

## 2023-07-14 NOTE — Inpatient Diabetes Management (Signed)
Inpatient Diabetes Program Recommendations  AACE/ADA: New Consensus Statement on Inpatient Glycemic Control (2015)  Target Ranges:  Prepandial:   less than 140 mg/dL      Peak postprandial:   less than 180 mg/dL (1-2 hours)      Critically ill patients:  140 - 180 mg/dL   Lab Results  Component Value Date   GLUCAP 290 (H) 07/14/2023   HGBA1C 6.4 (H) 11/19/2022    Review of Glycemic Control  Latest Reference Range & Units 07/14/23 03:15  Glucose-Capillary 70 - 99 mg/dL 811 (H)  (H): Data is abnormally high  Diabetes history: DM2 Outpatient Diabetes medications: Farxiga 10 every day, Humalog 0-15 TID, 70/30 30 units BID Current orders for Inpatient glycemic control: none  Inpatient Diabetes Program Recommendations:    Please consider glycemia control order set using Novolog 0-9 units Q4H while NPO &  Semglee 20 units QD  Will continue to follow while inpatient.  Thank you, Dulce Sellar, MSN, CDCES Diabetes Coordinator Inpatient Diabetes Program [phone] (team pager from 8a-5p)

## 2023-07-14 NOTE — Progress Notes (Signed)
Eeg done 

## 2023-07-14 NOTE — ED Notes (Signed)
EEG tech at bedside. 

## 2023-07-14 NOTE — ED Notes (Signed)
Patient pants and shirt placed in patient belongings bag in room. External catheter placed on patient. Brief and pad on patient. Patient pulled up in bed and adjusted. Wife at bedside

## 2023-07-14 NOTE — ED Notes (Addendum)
Pt's pulse ox dropped to 83%. Supplemented with 2L O2 on [place]. Pulse ox is now 98%. MD aware.

## 2023-07-14 NOTE — ED Provider Notes (Signed)
-----------------------------------------   5:41 AM on 07/14/2023 -----------------------------------------  I was just alerted that despite having the railings up on the bed, the patient crawled out of the end of the bed and then fell forward, striking his forehead and face on the floor and was unable to get up.  3 ED nurses and I placed a c-collar on him and assisted him back into bed.  He has a stellate skin tear on his forehead but without clear margins to suture.  He has a slight indentation of his right maxilla but is unclear if it is due to swelling and the pressure point where he was lying on it or if there is a fracture to the maxilla.  He remains disoriented and confused, unable to follow commands.  I ordered droperidol 2.5 mg IV as a therapeutic calming agent given his inability to make decisions for himself due to his acute CVA.  I ordered CT head, CT cervical spine, and CT maxillofacial to evaluate for the possibility of new intracranial or orthopedic trauma.  I made Dr. Arville Care of the hospitalist service aware of the acute issue.   Clinical Course as of 07/14/23 0657  Mon Jul 14, 2023                 0617 DG Foot 2 Views Left I viewed and interpreted the patient's foot x-rays.  No evidence of osteomyelitis [CF]  [id number] I viewed and interpreted the patient's head CT, maxillofacial CT, and cervical spine CT.  There is no evidence of acute fracture or intracranial injury.  I will clinically clear the patient and remove the c-collar which should help with his agitation.  I placed a Derma-Clip on his forehead wound. [CF]    Clinical Course User Index [CF] Loleta Rose, MD      Loleta Rose, MD 07/14/23 [phone]

## 2023-07-14 NOTE — ED Triage Notes (Signed)
Pt from home via EMS.  Wife noted pt had R sided weakness, expressive aphasia, slurred speech at approximately 0130.  LKN 2100.  Hx of TIA, cerebellar infarct. No daily blood thinners.

## 2023-07-14 NOTE — Progress Notes (Signed)
Neurology same day progress note - no charge  This is a 82 yo patient with hx focal seizures with impaired awareness who follows at [facility]. He is on vimpat 150mg  bid which was recently increased 9/23 after family reported increased staring spells at home. Patient was brought in overnight for L sided twitching, R sided weakness, and AMS with aphasia. He was seen by teleneuro as stroke code but did not receive TNK. Per hospitalist wife reported L sided twitching this AM for which he received ativan and lacosamide was increased to 200mg  IV q 12 hr.  On my examination he was oriented to self only and had some perseveration. He was anti-gravity in all extremities though slightly slower to respond to commands on R. He was having no motor activity concerning for seizures on my exam. He had lacerations to the top of his head and L knee that he sustained overnight in ED after he became agitated and had a fall.  rEEG personal review showed diffuse slowing with superimposed L focal slowing and overriding beta frequencies.  D/w Dr. Chipper Herb hospitalist. We agreed that patient will require transfer for cEEG given reports of intermittent L sided twitching and waxing/waning mental status. Transfer to [facility] was attempted but they had no beds therefore transfer will be arranged to [facility]. In the meantime will order MRI brain to be obtained while awaiting transfer.  Plan Awaiting transfer to [facility] for cEEG Continue vimpat 200mg  bid MRI brain wo contrast If further activity concerning for seizures, recommend 2mg  IV ativan and load keppra 20mg /kg and continue 500mg  bid (continue vimpat as well; keppra would be 2nd agent if needed)  Will continue to follow  Bing Neighbors, MD [facility] [phone]  If 7pm- 7am, please page neurology on call as listed in [facility].

## 2023-07-14 NOTE — ED Provider Notes (Signed)
[facility] Provider Note    Event Date/Time   First MD Initiated Contact with Patient 07/14/23 [phone]     (approximate)   History   Code Stroke  Level 5 caveat:  history/ROS limited by acute/critical illness  HPI Chris [name] is a 82 y.o. male whose medical history includes prior TIA and CAD.  He presents with altered behavior and possible stroke (code stroke called by EMS).  Reportedly he was normal at about 9 PM last night when he and his wife went to bed.  He woke her up at approximately 3 AM and was having a hard time articulating but clearly indicating that something was wrong.  He is not speaking and is having difficulty following commands and seems to have some generalized weakness but more weakness on his right side.  Code stroke was called and I visualized the patient before he went to CT.  No respiratory distress, no reported recent illnesses.     Physical Exam   ED Triage Vitals  Encounter Vitals Group     BP 07/14/23 0327 (!) 162/82     Systolic BP Percentile --      Diastolic BP Percentile --      Pulse Rate 07/14/23 0327 74     Resp 07/14/23 0327 20     Temp 07/14/23 0327 98.4 F (36.9 C)     Temp Source 07/14/23 0327 Oral     SpO2 07/14/23 0327 100 %     Weight 07/14/23 0333 92.4 kg (203 lb 11.2 oz)     Height 07/14/23 0333 1.88 m (6\' 2" )     Head Circumference --      Peak Flow --      Pain Score --      Pain Loc --      Pain Education --      Exclude from Growth Chart --      Most recent vital signs: Vitals:   07/14/23 0600 07/14/23 0630  BP: (!) 157/73 (!) 153/104  Pulse: 81 94  Resp: 18 (!) 21  Temp:    SpO2: 97% 100%    General: Alert but altered.  Follows some commands slowly, no obvious distress. CV:  Good peripheral perfusion.  Regular rate and rhythm. Resp:  Normal effort. no accessory muscle usage nor intercostal retractions.   Abd:  No distention.  No tenderness to palpation of the abdomen. Other:  Left foot  is wrapped in bandages.  Chronic wounds including a digit amputation is present but no evidence of acute infection.  NIH Stroke Scale  Interval: Baseline Time: 3:32 AM [name]  Administer stroke scale items in the order listed. Record performance in each category after each subscale exam. Do not go back and change scores. Follow directions provided for each exam technique. Scores should reflect what the patient does, not what the clinician thinks the patient can do. The clinician should record answers while administering the exam and work quickly. Except where indicated, the patient should not be coached (i.e., repeated requests to patient to make a special effort).   1a  Level of consciousness: 0=alert; keenly responsive  1b. LOC questions:  0=Performs both tasks correctly  1c. LOC commands: 0=Performs both tasks correctly  2.  Best Gaze: 0=normal  3.  Visual: 0=No visual loss  4. Facial Palsy: 0=Normal symmetric movement  5a.  Motor left arm: 0=No drift, limb holds 90 (or 45) degrees for full 10 seconds  5b.  Motor right arm: 1=Drift, limb holds 90 (or 45) degrees but drifts down before full 10 seconds: does not hit bed  6a. motor left leg: 0=No drift, limb holds 90 (or 45) degrees for full 10 seconds  6b  Motor right leg:  1=Drift, limb holds 90 (or 45) degrees but drifts down before full 10 seconds: does not hit bed  7. Limb Ataxia: 1=  8.  Sensory: 0=Normal; no sensory loss  9. Best Language:  2  10. Dysarthria: 2=Severe; patient speech is so slurred as to be unintelligible in the absence of or our of proportion to any dysphagia, or is mute/anarthric  11. Extinction and Inattention: 0=No abnormality  12. Distal motor function: 0=Normal   Total:   7     ED Results / Procedures / Treatments   Labs (all labs ordered are listed, but only abnormal results are displayed) Labs Reviewed  COMPREHENSIVE METABOLIC PANEL - Abnormal; Notable for the  following components:      Result Value   Chloride 97 (*)    Glucose, Bld 315 (*)    BUN 41 (*)    Creatinine, Ser 1.43 (*)    GFR, Estimated 49 (*)    All other components within normal limits  CBC WITH DIFFERENTIAL/PLATELET - Abnormal; Notable for the following components:   WBC 12.0 (*)    Neutro Abs 9.4 (*)    All other components within normal limits  CBG MONITORING, ED - Abnormal; Notable for the following components:   Glucose-Capillary 290 (*)    All other components within normal limits  APTT  PROTIME-INR  ETHANOL  URINE DRUG SCREEN, QUALITATIVE ([facility] ONLY)     EKG  ED ECG REPORT I, Loleta Rose, the attending physician, personally viewed and interpreted this ECG.  Date: 07/14/2023 EKG Time: 3:29 AM Rate: 74 Rhythm: Accelerated junctional rhythm QRS Axis: normal Intervals: Left bundle branch block ST/T Wave abnormalities: Non-specific ST segment / T-wave changes, but no clear evidence of acute ischemia. Narrative Interpretation: no definitive evidence of acute ischemia; does not meet STEMI criteria.    RADIOLOGY I viewed and interpreted the patient's head CT as well as consulting directly with the radiologist by phone.  No evidence of acute infarction or hemorrhage.  I also viewed and interpreted the patient Schuman's CTA head and neck.  See hospital course for details.   PROCEDURES:  Critical Care performed: Yes, see critical care procedure note(s)  .1-3 Lead EKG Interpretation  Performed by: Loleta Rose, MD Authorized by: Loleta Rose, MD     Interpretation: normal     ECG rate:  73   ECG rate assessment: normal     Rhythm: sinus rhythm     Ectopy: none     Conduction: normal   .Critical Care  Performed by: Loleta Rose, MD Authorized by: Loleta Rose, MD   Critical care provider statement:    Critical care time (minutes):  60   Critical care was necessary to treat or prevent imminent or life-threatening deterioration of the following  conditions:  CNS failure or compromise     IMPRESSION / MDM / ASSESSMENT AND PLAN / ED COURSE  I reviewed the triage vital signs and the nursing notes.                              Differential diagnosis includes, but is not limited to, CVA (ischemic versus hemorrhagic), infectious process, electrolyte or metabolic abnormality, renal failure.  Patient's presentation is most consistent with acute presentation with potential threat to life or bodily function.  Labs/studies ordered: CMP, ethanol level and urine drug screen to determine if there is any chemical reason for the patient's altered mental status, coagulation studies in case the patient needs an emergent intervention or blood products, CBC with differential, CMP, code stroke head CT, CTA head/neck with perfusion study, EKG  Interventions/Medications given:  Medications  iohexol (OMNIPAQUE) 350 MG/ML injection 125 mL (125 mLs Intravenous Contrast Given 07/14/23 0349)  aspirin suppository 300 mg (300 mg Rectal Given 07/14/23 0446)  droperidol (INAPSINE) 2.5 MG/ML injection 2.5 mg (2.5 mg Intravenous Given 07/14/23 0550)    (Note:  hospital course my include additional interventions and/or labs/studies not listed above.)   Patient is hypertensive but not severely so.  He is protecting his airway but is clearly off of his baseline and is able to follow some commands but seems to be substantially aphasic as well.  Some evidence of dysarthria, intermittent use of extremities.  Teleneurology evaluation is pending.  Patient is outside the window for TNKase due to his last known normal time being 6+ hours, but LVO is possible.  The patient is on the cardiac monitor to evaluate for evidence of arrhythmia and/or significant heart rate changes.   Clinical Course as of 07/14/23 0700  Mon Jul 14, 2023  4098 I consulted by phone with the neurologist who reports that a stroke certainly possible, confirmed the patient is outside the window  for TNKase, and recommended (and ordered) sending the patient back for perfusion studies to rule out LVO. [CF]  0407 Consulted again with the neurologist who has personally reviewed the CTA head/neck and perfusion studies and he does not see any other indication of LVO.  He recommends admission for further stroke workup. [CF]  0408 CT ANGIO HEAD NECK W WO CM W PERF (CODE STROKE) I also viewed and interpreted the patient's CTA head and neck and I do not see any acute or emergent abnormalities or obstructions. [CF]  0434 I was present in the room when the nurse performed a stroke swallow screen, and the patient failed on 3 sips, coughing and letting the water drool back out.  I am ordering rectal aspirin rather than oral. [CF]  1191 Consulted Dr. Arville Care with the hospitalist service who will admit [CF]  0617 DG Foot 2 Views Left I viewed and interpreted the patient's foot x-rays.  No evidence of osteomyelitis [CF]  [id number] I viewed and interpreted the patient's head CT, maxillofacial CT, and cervical spine CT.  There is no evidence of acute fracture or intracranial injury.  I will clinically clear the patient and remove the c-collar which should help with his agitation.  I placed a Derma-Clip on his forehead wound. [CF]    Clinical Course User Index [CF] Loleta Rose, MD     FINAL CLINICAL IMPRESSION(S) / ED DIAGNOSES   Final diagnoses:  Cerebrovascular accident (CVA), unspecified mechanism (HCC)  Chronic wound     Rx / DC Orders   ED Discharge Orders     None        Note:  This document was prepared using Dragon voice recognition software and may include unintentional dictation errors.   Loleta Rose, MD 07/14/23 0700

## 2023-07-14 NOTE — H&P (Signed)
History and Physical    Chris [name] ZOX:[id number] DOB: [birth year] DOA: 07/14/2023  PCP: Hoover Browns, MD (Confirm with patient/family/NH records and if not entered, this has to be entered at [facility] point of entry) Patient coming from: Home  I have personally briefly reviewed patient's old medical records in [facility]  Chief Complaint: Altered mentations  HPI: Chris [name] is a 82 y.o. 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  Patient woke up this morning around 1 AM, confused and started to have twitching like movement of left-sided and new onset right-sided weakness as well as staring and not responding.  Patient appeared to have trouble to communicate with family.  Family was concerned about stroke and called EMS.  At baseline, patient has a partial complex seizure for which she has been following with [facility] neurology since summer.  He was used to be on Keppra and due to side effect of drowsiness, antiseizure medication switched to Vimpat sometime in July, patient initially responded well however family reported that recently patient developed more staring episode and Vimpat dosage was increased couple weeks ago.  ED Course: Afebrile, nontachycardic blood pressure 160/88.  O2 saturation 100% on room air. Code stroke was called and patient was conservatively noncandidate for TNK treatment.  CT head negative for acute findings, CTA head and neck negative for large vessel occlusion.  Chest x-ray and UA pending.  Blood work WBC 12.0, hemoglobin 13, sodium 136, glucose 315, BUN 41, creatinine 1.4  Patient was becoming agitated in the ED, and had a fall hit his head and left arm.  Trauma scan of CT head and neck and facial CT showed no acute  fracture or dislocation.  Given droperidol in the ED   Review of Systems: Unable to perform, patient is confused.  Past Medical History:  Diagnosis Date   Coronary artery disease 2006   4-vessel CABG   Hyperlipidemia    Hypertension    TIA (transient ischemic attack)    Type 2 diabetes mellitus (HCC)     Past Surgical History:  Procedure Laterality Date   BYPASS GRAFT  2006   4-vessel   TOE AMPUTATION Left      reports that he has never smoked. He has never used smokeless tobacco. He reports that he does not currently use alcohol. He reports that he does not use drugs.  Allergies  Allergen Reactions   Lisinopril Other (See Comments)    HyperK   No Known Allergies     Family History  Problem Relation Age of Onset   Seizures Brother    Diabetes Mother    Heart disease Sister      Prior to Admission medications   Medication Sig Start Date End Date Taking? Authorizing Provider  aspirin EC 81 MG tablet Take 81 mg by mouth daily.   Yes [provider]  atorvastatin (LIPITOR) 40 MG tablet Take 40 mg by mouth daily at 6 PM.  08/11/18  Yes [provider]  cetirizine (ZYRTEC) 10 MG tablet Take 10 mg by mouth daily.   Yes [provider]  co-enzyme Q-10 30 MG capsule Take 30 mg by mouth daily.   Yes [provider]  dapagliflozin propanediol (FARXIGA) 10 MG TABS tablet Take 10 mg  by mouth daily.   Yes [provider]  furosemide (LASIX) 40 MG tablet TAKE 1 TABLET(40 MG) BY MOUTH EVERY DAY 09/22/18  Yes [provider]  gabapentin (NEURONTIN) 300 MG capsule TAKE 4 CAPSULES(1200 MG) BY MOUTH TWICE DAILY 09/22/18  Yes [provider]  insulin lispro (HUMALOG) 100 UNIT/ML KwikPen Inject 0-15 Units into the skin 3 (three) times daily. 01/03/23  Yes [provider]  insulin lispro protamine-lispro (HUMALOG 75/25 MIX) (75-25) 100 UNIT/ML SUSP injection Inject 30 Units into the skin 2 (two) times daily. 07/20/19   Yes Gouru, Deanna Artis, MD  Lacosamide 100 MG TABS Take 100 mg by mouth in the morning and at bedtime. 06/16/23  Yes [provider]  levothyroxine (SYNTHROID, LEVOTHROID) 88 MCG tablet TAKE 1 TABLET BY MOUTH DAILY AT 6 AM 10/29/18  Yes [provider]  losartan (COZAAR) 25 MG tablet Take 25 mg by mouth daily.    Yes [provider]  pentoxifylline (TRENTAL) 400 MG CR tablet TAKE 1 TABLET(400 MG) BY MOUTH TWICE DAILY 07/29/18  Yes [provider]  Cyanocobalamin (B-12) 2500 MCG TABS Take 1 tablet by mouth daily. Patient not taking: Reported on 07/14/2023 07/20/19   Ramonita Lab, MD  isosorbide mononitrate (IMDUR) 30 MG 24 hr tablet TK 1-2 TS PO D. TK 30 MG D. CAN INCREASE TO 60 MG D IF SYMPTOMS DO NOT RESOLVE Patient not taking: Reported on 07/14/2023 09/21/18   [provider]  lidocaine (XYLOCAINE) 5 % ointment Apply 1 application topically as needed. 09/23/19   Helane Gunther, DPM  Multiple Vitamins-Minerals (MULTIVITAMIN WITH MINERALS) tablet Take 1 tablet by mouth daily.  Patient not taking: Reported on 07/14/2023    [provider]  omega-3 acid ethyl esters (LOVAZA) 1 g capsule Take 1 g by mouth 2 (two) times daily. Patient not taking: Reported on 07/14/2023    [provider]  permethrin (ELIMITE) 5 % cream Apply from neck down, in between toes and fingers, leave on overnight. Wash off in am and repeat in 1 week. Patient not taking: Reported on 07/14/2023 04/10/22   Sandi Mealy, MD    Physical Exam: Vitals:   07/14/23 0929 07/14/23 1018 07/14/23 1030 07/14/23 1100  BP:  (!) 162/92 (!) 146/64 (!) 131/49  Pulse: 90 97 94 86  Resp: 20 (!) 23 14 17   Temp:      TempSrc:      SpO2: 99% 100% 92% 99%  Weight:      Height:        Constitutional: NAD, calm, comfortable Vitals:   07/14/23 0929 07/14/23 1018 07/14/23 1030 07/14/23 1100  BP:  (!) 162/92 (!) 146/64 (!) 131/49  Pulse: 90 97 94 86  Resp: 20 (!) 23 14 17   Temp:       TempSrc:      SpO2: 99% 100% 92% 99%  Weight:      Height:       Eyes: PERRL, lids and conjunctivae normal ENMT: Mucous membranes are moist. Posterior pharynx clear of any exudate or lesions.Normal dentition.  Neck: normal, supple, no masses, no thyromegaly Respiratory: clear to auscultation bilaterally, no wheezing, no crackles. Normal respiratory effort. No accessory muscle use.  Cardiovascular: Regular rate and rhythm, no murmurs / rubs / gallops. No extremity edema. 2+ pedal pulses. No carotid bruits.  Abdomen: no tenderness, no masses palpated. No hepatosplenomegaly. Bowel sounds positive.  Musculoskeletal: no clubbing / cyanosis. No joint deformity upper and lower extremities. Good ROM, no contractures. Normal muscle  tone.  Skin: no rashes, lesions, ulcers. No induration Neurologic: Twitching like movement on right side of the body, no facial droops, not following commands Psychiatric: Awake, confused    Labs on Admission: I have personally reviewed following labs and imaging studies  CBC: Recent Labs  Lab 07/14/23 0320  WBC 12.0*  NEUTROABS 9.4*  HGB 13.2  HCT 40.3  MCV 94.4  PLT 180   Basic Metabolic Panel: Recent Labs  Lab 07/14/23 0320  NA 136  K 4.2  CL 97*  CO2 24  GLUCOSE 315*  BUN 41*  CREATININE 1.43*  CALCIUM 8.9   GFR: Estimated Creatinine Clearance: 47.1 mL/min (A) (by C-G formula based on SCr of 1.43 mg/dL (H)). Liver Function Tests: Recent Labs  Lab 07/14/23 0320  AST 23  ALT 21  ALKPHOS 109  BILITOT 0.8  PROT 7.6  ALBUMIN 4.2   No results for input(s): "LIPASE", "AMYLASE" in the last 168 hours. No results for input(s): "AMMONIA" in the last 168 hours. Coagulation Profile: Recent Labs  Lab 07/14/23 0320  INR 1.1   Cardiac Enzymes: No results for input(s): "CKTOTAL", "CKMB", "CKMBINDEX", "TROPONINI" in the last 168 hours. BNP (last 3 results) No results for input(s): "PROBNP" in the last 8760 hours. HbA1C: No results for  input(s): "HGBA1C" in the last 72 hours. CBG: Recent Labs  Lab 07/14/23 0315  GLUCAP 290*   Lipid Profile: No results for input(s): "CHOL", "HDL", "LDLCALC", "TRIG", "CHOLHDL", "LDLDIRECT" in the last 72 hours. Thyroid Function Tests: No results for input(s): "TSH", "T4TOTAL", "FREET4", "T3FREE", "THYROIDAB" in the last 72 hours. Anemia Panel: No results for input(s): "VITAMINB12", "FOLATE", "FERRITIN", "TIBC", "IRON", "RETICCTPCT" in the last 72 hours. Urine analysis: No results found for: "COLORURINE", "APPEARANCEUR", "LABSPEC", "PHURINE", "GLUCOSEU", "HGBUR", "BILIRUBINUR", "KETONESUR", "PROTEINUR", "UROBILINOGEN", "NITRITE", "LEUKOCYTESUR"  Radiological Exams on Admission: CT Head Wo Contrast  Result Date: 07/14/2023 CLINICAL DATA:  Neck trauma.  Fall with head trauma. EXAM: CT HEAD WITHOUT CONTRAST CT MAXILLOFACIAL WITHOUT CONTRAST CT CERVICAL SPINE WITHOUT CONTRAST TECHNIQUE: Multidetector CT imaging of the head, cervical spine, and maxillofacial structures were performed using the standard protocol without intravenous contrast. Multiplanar CT image reconstructions of the cervical spine and maxillofacial structures were also generated. RADIATION DOSE REDUCTION: This exam was performed according to the departmental dose-optimization program which includes automated exposure control, adjustment of the mA and/or kV according to patient size and/or use of iterative reconstruction technique. COMPARISON:  None Available. FINDINGS: CT HEAD FINDINGS Brain: No evidence of acute infarction, hemorrhage, hydrocephalus, extra-axial collection or mass lesion/mass effect. Vascular: No hyperdense vessel or unexpected calcification. Skull: Normal. Negative for fracture or focal lesion. CT MAXILLOFACIAL FINDINGS Osseous: No fracture or mandibular dislocation. No destructive process. Orbits: Negative. No traumatic or inflammatory finding. Sinuses: Clear Soft tissues: Negative CT CERVICAL SPINE FINDINGS  Alignment: Normal Skull base and vertebrae: No acute fracture. No primary bone lesion or focal pathologic process. C4-C7 ACDF. Bridging spondylitic spurs at C2-3 and C7-T1. Soft tissues and spinal canal: No prevertebral fluid or swelling. No visible canal hematoma. Disc levels: C3-4 bilateral foraminal narrowing from disc height loss and ridging. No bony spinal canal impingement detected. Upper chest: No visible injury IMPRESSION: No evidence of intracranial or cervical spine injury. Negative for facial fracture. Electronically Signed   By: Tiburcio Pea M.D.   On: 07/14/2023 06:43   CT Maxillofacial Wo Contrast  Result Date: 07/14/2023 CLINICAL DATA:  Neck trauma.  Fall with head trauma. EXAM: CT HEAD WITHOUT CONTRAST CT MAXILLOFACIAL WITHOUT CONTRAST CT  CERVICAL SPINE WITHOUT CONTRAST TECHNIQUE: Multidetector CT imaging of the head, cervical spine, and maxillofacial structures were performed using the standard protocol without intravenous contrast. Multiplanar CT image reconstructions of the cervical spine and maxillofacial structures were also generated. RADIATION DOSE REDUCTION: This exam was performed according to the departmental dose-optimization program which includes automated exposure control, adjustment of the mA and/or kV according to patient size and/or use of iterative reconstruction technique. COMPARISON:  None Available. FINDINGS: CT HEAD FINDINGS Brain: No evidence of acute infarction, hemorrhage, hydrocephalus, extra-axial collection or mass lesion/mass effect. Vascular: No hyperdense vessel or unexpected calcification. Skull: Normal. Negative for fracture or focal lesion. CT MAXILLOFACIAL FINDINGS Osseous: No fracture or mandibular dislocation. No destructive process. Orbits: Negative. No traumatic or inflammatory finding. Sinuses: Clear Soft tissues: Negative CT CERVICAL SPINE FINDINGS Alignment: Normal Skull base and vertebrae: No acute fracture. No primary bone lesion or focal pathologic  process. C4-C7 ACDF. Bridging spondylitic spurs at C2-3 and C7-T1. Soft tissues and spinal canal: No prevertebral fluid or swelling. No visible canal hematoma. Disc levels: C3-4 bilateral foraminal narrowing from disc height loss and ridging. No bony spinal canal impingement detected. Upper chest: No visible injury IMPRESSION: No evidence of intracranial or cervical spine injury. Negative for facial fracture. Electronically Signed   By: Tiburcio Pea M.D.   On: 07/14/2023 06:43   CT Cervical Spine Wo Contrast  Result Date: 07/14/2023 CLINICAL DATA:  Neck trauma.  Fall with head trauma. EXAM: CT HEAD WITHOUT CONTRAST CT MAXILLOFACIAL WITHOUT CONTRAST CT CERVICAL SPINE WITHOUT CONTRAST TECHNIQUE: Multidetector CT imaging of the head, cervical spine, and maxillofacial structures were performed using the standard protocol without intravenous contrast. Multiplanar CT image reconstructions of the cervical spine and maxillofacial structures were also generated. RADIATION DOSE REDUCTION: This exam was performed according to the departmental dose-optimization program which includes automated exposure control, adjustment of the mA and/or kV according to patient size and/or use of iterative reconstruction technique. COMPARISON:  None Available. FINDINGS: CT HEAD FINDINGS Brain: No evidence of acute infarction, hemorrhage, hydrocephalus, extra-axial collection or mass lesion/mass effect. Vascular: No hyperdense vessel or unexpected calcification. Skull: Normal. Negative for fracture or focal lesion. CT MAXILLOFACIAL FINDINGS Osseous: No fracture or mandibular dislocation. No destructive process. Orbits: Negative. No traumatic or inflammatory finding. Sinuses: Clear Soft tissues: Negative CT CERVICAL SPINE FINDINGS Alignment: Normal Skull base and vertebrae: No acute fracture. No primary bone lesion or focal pathologic process. C4-C7 ACDF. Bridging spondylitic spurs at C2-3 and C7-T1. Soft tissues and spinal canal: No  prevertebral fluid or swelling. No visible canal hematoma. Disc levels: C3-4 bilateral foraminal narrowing from disc height loss and ridging. No bony spinal canal impingement detected. Upper chest: No visible injury IMPRESSION: No evidence of intracranial or cervical spine injury. Negative for facial fracture. Electronically Signed   By: Tiburcio Pea M.D.   On: 07/14/2023 06:43   DG Foot 2 Views Left  Result Date: 07/14/2023 CLINICAL DATA:  Pain. Wound on left foot. Evaluate for osteomyelitis. 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 There is been surgical resection of the distal aspect of the fourth metatarsal bone and fifth metatarsal bone. There are no signs of underlying acute fracture or dislocation. No focal bone erosions identified to indicate osteomyelitis. Vascular calcifications noted. Calcaneal heel spurs. IMPRESSION: 1. No acute findings. 2. 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 3. Surgical resection of the distal aspect  of the fourth and fifth metatarsal bones. Electronically Signed   By: Signa Kell M.D.   On: 07/14/2023 05:46   CT ANGIO HEAD NECK W WO CM W PERF (CODE STROKE)  Result Date: 07/14/2023 CLINICAL DATA:  Slurred speech EXAM: CT ANGIOGRAPHY HEAD AND NECK TECHNIQUE: Multidetector CT imaging of the head and neck was performed using the standard protocol during bolus administration of intravenous contrast. Multiplanar CT image reconstructions and MIPs were obtained to evaluate the vascular anatomy. Carotid stenosis measurements (when applicable) are obtained utilizing NASCET criteria, using the distal internal carotid diameter as the denominator. RADIATION DOSE REDUCTION: This exam was performed according to the departmental dose-optimization program which includes automated exposure control, adjustment of the mA and/or kV according to patient size and/or use  of iterative reconstruction technique. CONTRAST:  OMNIPAQUE IOHEXOL 350 MG/ML SOLN COMPARISON:  Head CT from earlier today FINDINGS: CTA NECK FINDINGS Aortic arch: Atheromatous plaque.  Three vessel branching. Right carotid system: Mixed density plaque mainly at the bifurcation without flow reducing stenosis or ulceration. Left carotid system: Mixed density plaque mainly at the bifurcation without flow reducing stenosis or ulceration. Vertebral arteries: No proximal subclavian stenosis. The right vertebral artery is dominant. No vertebral flow reducing stenosis, beading, or dissection. Skeleton: Generalized cervical solid arthrodesis and spondylitic ankylosis. Other neck: No acute finding Upper chest: No acute finding Review of the MIP images confirms the above findings CTA HEAD FINDINGS Anterior circulation: Extensive atheromatous plaque on the carotid siphons. No branch occlusion, beading, or aneurysm. Posterior circulation: Right dominant vertebral artery. Mild atheromatous plaque to the basilar. No branch occlusion, beading, aneurysm, or flow reducing stenosis. Venous sinuses: Unremarkable for contrast timing Anatomic variants: As above Review of the MIP images confirms the above findings IMPRESSION: 1. No emergent finding. 2. Atherosclerosis without flow reducing stenosis or ulceration of major arteries in the head and neck. Electronically Signed   By: Tiburcio Pea M.D.   On: 07/14/2023 04:08   CT HEAD CODE STROKE WO CONTRAST  Result Date: 07/14/2023 CLINICAL DATA:  Code stroke.  Slurred speech, right-sided weakness EXAM: CT HEAD WITHOUT CONTRAST TECHNIQUE: Contiguous axial images were obtained from the base of the skull through the vertex without intravenous contrast. RADIATION DOSE REDUCTION: This exam was performed according to the departmental dose-optimization program which includes automated exposure control, adjustment of the mA and/or kV according to patient size and/or use of iterative  reconstruction technique. COMPARISON:  11/18/2022 FINDINGS: Brain: No evidence of acute infarction, hemorrhage, mass, mass effect, or midline shift. No hydrocephalus or extra-axial collection. Vascular: No hyperdense vessel. Atherosclerotic calcifications in the intracranial carotid and vertebral arteries. Skull: Negative for fracture or focal lesion. Sinuses/Orbits: No acute finding. Other: The mastoid air cells are well aerated. ASPECTS [facility] Stroke Program Early CT Score) - Ganglionic level infarction (caudate, lentiform nuclei, internal capsule, insula, M1-M3 cortex): 7 - Supraganglionic infarction (M4-M6 cortex): 3 Total score (0-10 with 10 being normal): 10 IMPRESSION: No evidence of acute intracranial process. ASPECTS is 10. Code stroke imaging results were communicated on 07/14/2023 at 3:25 am to provider [facility] via telephone, who verbally acknowledged these results. Electronically Signed   By: Wiliam Ke M.D.   On: 07/14/2023 03:26    EKG: Independently reviewed.  Sinus rhythm, reported as accelerated junctional rhythm, no acute ST changes.  Assessment/Plan Principal Problem:   CVA (cerebral vascular accident) [facility]) Active Problems:   Seizure (HCC)  (please populate well all problems here in Problem List. (For example, if patient is on  BP meds at home and you resume or decide to hold them, it is a problem that needs to be her. Same for CAD, COPD, HLD and so on)  Acute metabolic encephalopathy -Suspect status epilepticus -1 mg IV Ativan given had some effect on the twitching movement of the left side and patient became very sleepy.  Discussed with on-call neurology Dr. Selina Cooley, recommended loading patient with Vimpat 200 mg IV x 1 and continue home dose of 100 mg twice daily.  Contacted [facility] transplant center for transfer patient to a tertiary center for continuous EEG monitoring.  Family agreed with the plan. -MRI when patient is more stable -Routine EEG ordered -Other DDx, patient does  not have any new cough, unable to assess symptoms such as dysuria at this point and family denied patient has diarrhea.  UA and x-ray of chest sent.  Monitor off antibiotics for now  Status epilepticus Breakthrough seizure -Admitted to PCU for close monitoring -Frequent neurochecks -Fall precaution, seizure precaution -Antiseizure medication as above  Question of stroke -MRI when patient mentation stabilized -Permissive hypertension, hold off home BP meds -start as needed hydralazine  Chronic left foot ulcer -No symptoms or signs of active infection.  Left foot x-ray showed no osteomyelitis and only chronic.  Changes.  Was not seen by podiatry 2 and half weeks ago.   DVT prophylaxis: Lovenox Code Status: Full code Family Communication: Wife at bedside Disposition Plan: Send is sick with status epilepticus, recurrent continue EEG monitoring and inpatient neurology consultation, expect more than 2 midnight hospital stay Consults called: Neurology Admission status: PCU admission   Emeline General MD [facility] Pager [phone]  07/14/2023, 11:33 AM

## 2023-07-14 NOTE — Evaluation (Signed)
Clinical/Bedside Swallow Evaluation Patient Details  Name: Chris [name] MRN: [id number] Date of Birth: 29-Jul-1941  Today's Date: 07/14/2023 Time: SLP Start Time (ACUTE ONLY): 1125 SLP Stop Time (ACUTE ONLY): 1215 SLP Time Calculation (min) (ACUTE ONLY): 50 min  Past Medical History:  Past Medical History:  Diagnosis Date   Coronary artery disease 2006   4-vessel CABG   Hyperlipidemia    Hypertension    TIA (transient ischemic attack)    Type 2 diabetes mellitus (HCC)    Past Surgical History:  Past Surgical History:  Procedure Laterality Date   BYPASS GRAFT  2006   4-vessel   TOE AMPUTATION Left    HPI:  Pt is a 82 y.o. 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   Patient woke up this morning around 1 AM, confused and started to have twitching like movement of left-sided and new onset right-sided weakness as well as staring and not responding.  Patient appeared to have trouble communicating with family.  Family was concerned about stroke and called EMS.   At baseline, patient has a partial complex seizure for which she has been following with [facility] neurology since summer.  He was used to be on Keppra and due to side effect of drowsiness, antiseizure medication switched to Vimpat sometime in July, patient initially responded well however family reported that recently patient developed more staring episode and Vimpat dosage was increased couple weeks ago.     Head CT: No emergent findings.  CXR: no acute processes.       OF NOTE: per chart and Wife's report: "Pt does have a baseline of c/o "2" GI Barium swallow studies for Esophageal phase dysmotility issues ~2 years ago. He denied any current s/s of aspiration but describes difficulty swallowing such foods as  meats, rice pointing to her sternal notch-mid sternum areas -- he described a "ledge" there that bothers him when eating certain foods at home.".    Assessment / Plan / Recommendation  Clinical Impression   Pt seen for BSE today. Pt awakened w/ verbal/tactile stim; did not sustain attention to tasks. Intially had a fixed stare but then engaged briefly w/ this SLP and Wife present. he stated his and Wife's name, along w/ 3-4 short words-phrases. Mumbled speech noted. Dry oral cavity.  On RA; afebrile. WBC 12.0  Pt appears to present w/ oropharyngeal phase dysphagia in setting of declined Neurological status/change; unsure of full baseline functioning but Wife did not report any prior swallowing difficulty at typical meals. ANY acute/chronic change in Cognitive functioning can impact overall awareness during oral intake the the timing of swallow thus safety during po tasks which increases risk for aspiration/aspiration pneumonia.  Pt required MOD+ verbal/tactile/visual cues during po tasks. Pt's risk for aspiration is HIGH at this time.   Pt was only presented w/ single ice chip trials at this evaluation today. He consumed several trials of ice chips No overt clinical s/s of aspiration noted: no immediate/overt coughing/throat clearing, no decline in vocal quality when assessed, no decline in respiratory status during/post trials. Hyolaryngeal excursion was functional. Oral phase was adequate for bolus management, mastication, and oral clearing of the boluses given. Mastication c/b more munching, vertical chewing.  OM Exam was cursory but appeared [facility] w/ bolus management of ice chips --  no unilateral weakness noted. MOD++ confusion of OM tasks and oral care -- pt bit the tongue blade; oral care was limited d/t oral confusion and tonic biting.     D/t pt's declined Neurological status currently, recommend continue NPO status for diet w/ Therapeutic single ice chip trials for stimulation of oropharyngeal  swallowing and oral hygiene. 100% NSG Supervision; aspiration precautions; reduce Distractions during po's. Stop if any coughing noted.  ST services will continue to follow for ongoing assessment of swallowing and trials to upgrade diet as appropriate next 1-2 days as Neurological status improves. Recommends follow w/ Palliative Care for GOC and education in setting of chronic medical issues. Precautions posted in chart.  Education w/ Wife at bedside on POC, agreed. Dietician f/u. SLP Visit Diagnosis: Dysphagia, oropharyngeal phase (R13.12) (in setting of mental status decline)    Aspiration Risk  Moderate aspiration risk;Risk for inadequate nutrition/hydration    Diet Recommendation   NPO - Therapeutic single ice chip trials for stimulation of oropharyngeal swallowing and oral hygiene. NSG supervision. Aspiration precautions.   Medication Administration: Via alternative means    Other  Recommendations Recommended Consults:  (Dietician f/u; Neurology following) Oral Care Recommendations: Oral care QID;Oral care prior to ice chip/H20;Staff/trained caregiver to provide oral care Caregiver Recommendations: Avoid jello, ice cream, thin soups, popsicles;Remove water pitcher;Have oral suction available    Recommendations for follow up therapy are one component of a multi-disciplinary discharge planning process, led by the attending physician.  Recommendations may be updated based on patient status, additional functional criteria and insurance authorization.  Follow up Recommendations Follow physician's recommendations for discharge plan and follow up therapies      Assistance Recommended at Discharge  FULL  Functional Status Assessment Patient has had a recent decline in their functional status and demonstrates the ability to make significant improvements in function in a reasonable and predictable amount of time.  Frequency and Duration min 2x/week  2 weeks       Prognosis Prognosis for  improved oropharyngeal function: Guarded (currently) Barriers to Reach Goals: Cognitive deficits;Time post onset;Severity of deficits;Behavior Barriers/Prognosis Comment: neurological decline      Swallow Study   General Date of Onset: 07/14/23 HPI: Pt is a 81 y.o. 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     Patient woke up this morning around 1 AM, confused and started to have twitching like movement of left-sided and new onset right-sided weakness as well as staring and not responding.  Patient appeared to have trouble communicating with family.  Family was concerned about stroke and called EMS.     At baseline, patient has a partial complex seizure for which she has been following with [facility] neurology since summer.  He was used to be on Keppra and due to side effect of drowsiness, antiseizure medication switched to Vimpat sometime in July, patient initially responded well however family reported that recently patient developed more staring episode and Vimpat dosage was increased couple weeks ago.    Head CT: No emergent findings.   CXR: no acute processes.      OF NOTE: per chart and Wife's report: "Pt does have a baseline of c/o "2" GI Barium swallow studies for Esophageal phase dysmotility issues ~2 years ago. He denied any current s/s of aspiration  but describes difficulty swallowing such foods as meats, rice pointing to her sternal notch-mid sternum areas -- he described a "ledge" there that bothers him when eating certain foods at home.". Type of Study: Bedside Swallow Evaluation Previous Swallow Assessment: 2020 - mech soft/regular diet Diet Prior to this Study: NPO Temperature Spikes Noted: No (wbc 12.0) Respiratory Status: Room air History of Recent  Intubation: No Behavior/Cognition: Alert;Cooperative;Pleasant mood;Confused;Requires cueing;Distractible;Doesn't follow directions (inconsistent) Oral Cavity Assessment: Dried secretions;Dry (appearance of dried blood) Oral Care Completed by SLP: No (attempted but pt bit the tongue blade and did not immediately let go -- did not want to introduce sponge swabs) Oral Cavity - Dentition: Adequate natural dentition;Missing dentition (few) Vision:  (n/a) Self-Feeding Abilities: Total assist Patient Positioning: Upright in bed (needed full assist) Baseline Vocal Quality: Low vocal intensity (muttered/mumbled speech) Volitional Cough: Cognitively unable to elicit Volitional Swallow: Unable to elicit    Oral/Motor/Sensory Function Overall Oral Motor/Sensory Function: Within functional limits (No overt unilateral weakness noted w/ bolus management)   Ice Chips Ice chips: Within functional limits (grossly) Presentation: Spoon (fed; 12 trials) Other Comments: min decreased oral prep awsareness initially   Thin Liquid Thin Liquid: Not tested    Nectar Thick Nectar Thick Liquid: Not tested   Honey Thick Honey Thick Liquid: Not tested   Puree Puree: Not tested   Solid     Solid: Not tested        Jerilynn Som, MS, CCC-SLP Speech Language Pathologist Rehab Services; [facility] [phone] (ascom) Suttyn Cryder 07/14/2023,2:59 PM

## 2023-07-14 NOTE — ED Notes (Signed)
Wife at bedside. Admitting doctor at bedside speaking with wife and assessing patient

## 2023-07-15 ENCOUNTER — Encounter (HOSPITAL_COMMUNITY): Payer: Medicare Other

## 2023-07-15 ENCOUNTER — Encounter (HOSPITAL_COMMUNITY): Payer: Self-pay | Admitting: Internal Medicine

## 2023-07-15 ENCOUNTER — Inpatient Hospital Stay (HOSPITAL_COMMUNITY)
Admission: AD | Admit: 2023-07-15 | Discharge: 2023-07-21 | DRG: 101 | Disposition: A | Discharge status: Skilled Nursing Facility | Payer: Medicare Other | Source: Other Acute Inpatient Hospital | Attending: Family Medicine | Admitting: Family Medicine

## 2023-07-15 ENCOUNTER — Other Ambulatory Visit: Payer: Self-pay

## 2023-07-15 DIAGNOSIS — E119 Type 2 diabetes mellitus without complications: Secondary | ICD-10-CM | POA: Diagnosis not present

## 2023-07-15 DIAGNOSIS — E11621 Type 2 diabetes mellitus with foot ulcer: Secondary | ICD-10-CM | POA: Diagnosis present

## 2023-07-15 DIAGNOSIS — Z79899 Other long term (current) drug therapy: Secondary | ICD-10-CM

## 2023-07-15 DIAGNOSIS — G40201 Localization-related (focal) (partial) symptomatic epilepsy and epileptic syndromes with complex partial seizures, not intractable, with status epilepticus: Secondary | ICD-10-CM | POA: Diagnosis present

## 2023-07-15 DIAGNOSIS — I10 Essential (primary) hypertension: Secondary | ICD-10-CM | POA: Diagnosis present

## 2023-07-15 DIAGNOSIS — Y92238 Other place in hospital as the place of occurrence of the external cause: Secondary | ICD-10-CM | POA: Diagnosis present

## 2023-07-15 DIAGNOSIS — E1151 Type 2 diabetes mellitus with diabetic peripheral angiopathy without gangrene: Secondary | ICD-10-CM | POA: Diagnosis present

## 2023-07-15 DIAGNOSIS — Z7982 Long term (current) use of aspirin: Secondary | ICD-10-CM

## 2023-07-15 DIAGNOSIS — S0101XA Laceration without foreign body of scalp, initial encounter: Secondary | ICD-10-CM | POA: Diagnosis present

## 2023-07-15 DIAGNOSIS — R569 Unspecified convulsions: Principal | ICD-10-CM

## 2023-07-15 DIAGNOSIS — Z833 Family history of diabetes mellitus: Secondary | ICD-10-CM | POA: Diagnosis not present

## 2023-07-15 DIAGNOSIS — W19XXXA Unspecified fall, initial encounter: Secondary | ICD-10-CM | POA: Diagnosis present

## 2023-07-15 DIAGNOSIS — Z794 Long term (current) use of insulin: Secondary | ICD-10-CM | POA: Diagnosis not present

## 2023-07-15 DIAGNOSIS — Z23 Encounter for immunization: Secondary | ICD-10-CM

## 2023-07-15 DIAGNOSIS — I2510 Atherosclerotic heart disease of native coronary artery without angina pectoris: Secondary | ICD-10-CM | POA: Diagnosis present

## 2023-07-15 DIAGNOSIS — Z951 Presence of aortocoronary bypass graft: Secondary | ICD-10-CM | POA: Diagnosis not present

## 2023-07-15 DIAGNOSIS — W06XXXA Fall from bed, initial encounter: Secondary | ICD-10-CM | POA: Diagnosis present

## 2023-07-15 DIAGNOSIS — N179 Acute kidney failure, unspecified: Secondary | ICD-10-CM | POA: Diagnosis present

## 2023-07-15 DIAGNOSIS — L97529 Non-pressure chronic ulcer of other part of left foot with unspecified severity: Secondary | ICD-10-CM | POA: Diagnosis present

## 2023-07-15 DIAGNOSIS — G9341 Metabolic encephalopathy: Secondary | ICD-10-CM | POA: Diagnosis present

## 2023-07-15 DIAGNOSIS — Z8673 Personal history of transient ischemic attack (TIA), and cerebral infarction without residual deficits: Secondary | ICD-10-CM | POA: Diagnosis not present

## 2023-07-15 DIAGNOSIS — R4182 Altered mental status, unspecified: Secondary | ICD-10-CM | POA: Diagnosis present

## 2023-07-15 DIAGNOSIS — Z8249 Family history of ischemic heart disease and other diseases of the circulatory system: Secondary | ICD-10-CM | POA: Diagnosis not present

## 2023-07-15 DIAGNOSIS — E039 Hypothyroidism, unspecified: Secondary | ICD-10-CM | POA: Diagnosis present

## 2023-07-15 DIAGNOSIS — Z79890 Hormone replacement therapy (postmenopausal): Secondary | ICD-10-CM | POA: Diagnosis not present

## 2023-07-15 DIAGNOSIS — Z751 Person awaiting admission to adequate facility elsewhere: Secondary | ICD-10-CM | POA: Diagnosis not present

## 2023-07-15 DIAGNOSIS — E785 Hyperlipidemia, unspecified: Secondary | ICD-10-CM | POA: Diagnosis present

## 2023-07-15 LAB — CBC
HCT: 34.4 % — ABNORMAL LOW (ref 39.0–52.0)
Hemoglobin: 11.8 g/dL — ABNORMAL LOW (ref 13.0–17.0)
MCH: 31.4 pg (ref 26.0–34.0)
MCHC: 34.3 g/dL (ref 30.0–36.0)
MCV: 91.5 fL (ref 80.0–100.0)
Platelets: 136 10*3/uL — ABNORMAL LOW (ref 150–400)
RBC: 3.76 MIL/uL — ABNORMAL LOW (ref 4.22–5.81)
RDW: 13.1 % (ref 11.5–15.5)
WBC: 12.5 10*3/uL — ABNORMAL HIGH (ref 4.0–10.5)
nRBC: 0 % (ref 0.0–0.2)

## 2023-07-15 LAB — BASIC METABOLIC PANEL
Anion gap: 11 (ref 5–15)
BUN: 50 mg/dL — ABNORMAL HIGH (ref 8–23)
CO2: 26 mmol/L (ref 22–32)
Calcium: 8.7 mg/dL — ABNORMAL LOW (ref 8.9–10.3)
Chloride: 105 mmol/L (ref 98–111)
Creatinine, Ser: 1.28 mg/dL — ABNORMAL HIGH (ref 0.61–1.24)
GFR, Estimated: 56 mL/min — ABNORMAL LOW (ref >60)
Glucose, Bld: 250 mg/dL — ABNORMAL HIGH (ref 70–99)
Potassium: 4.1 mmol/L (ref 3.5–5.1)
Sodium: 142 mmol/L (ref 135–145)

## 2023-07-15 LAB — GLUCOSE, CAPILLARY
Glucose-Capillary: 200 mg/dL — ABNORMAL HIGH (ref 70–99)
Glucose-Capillary: 278 mg/dL — ABNORMAL HIGH (ref 70–99)
Glucose-Capillary: 250 mg/dL — ABNORMAL HIGH (ref 70–99)
Glucose-Capillary: 380 mg/dL — ABNORMAL HIGH (ref 70–99)

## 2023-07-15 LAB — TSH: TSH: 0.294 u[IU]/mL — ABNORMAL LOW (ref 0.350–4.500)

## 2023-07-15 LAB — HEMOGLOBIN A1C
Hgb A1c MFr Bld: 6.8 % — ABNORMAL HIGH (ref 4.8–5.6)
Mean Plasma Glucose: 148.46 mg/dL

## 2023-07-15 MED ORDER — ATORVASTATIN CALCIUM 80 MG PO TABS
80.0000 mg | ORAL_TABLET | Freq: Every day | ORAL | Status: DC
  Administered 2023-07-15 – 2023-07-21 (×7): 80 mg via ORAL
  Filled 2023-07-15 (×7): qty 1

## 2023-07-15 MED ORDER — SODIUM CHLORIDE 0.9% FLUSH
3.0000 mL | Freq: Two times a day (BID) | INTRAVENOUS | Status: DC
  Administered 2023-07-16 – 2023-07-21 (×7): 3 mL via INTRAVENOUS

## 2023-07-15 MED ORDER — INSULIN ASPART PROT & ASPART (70-30 MIX) 100 UNIT/ML ~~LOC~~ SUSP
30.0000 [IU] | Freq: Two times a day (BID) | SUBCUTANEOUS | Status: DC
  Administered 2023-07-16 – 2023-07-21 (×11): 30 [IU] via SUBCUTANEOUS
  Filled 2023-07-15: qty 10

## 2023-07-15 MED ORDER — DAPAGLIFLOZIN PROPANEDIOL 10 MG PO TABS
10.0000 mg | ORAL_TABLET | Freq: Every day | ORAL | Status: DC
  Administered 2023-07-15 – 2023-07-21 (×7): 10 mg via ORAL
  Filled 2023-07-15 (×7): qty 1

## 2023-07-15 MED ORDER — ALBUTEROL SULFATE (2.5 MG/3ML) 0.083% IN NEBU: 2.5000 mg | INHALATION_SOLUTION | Freq: Four times a day (QID) | RESPIRATORY_TRACT | Status: DC | PRN

## 2023-07-15 MED ORDER — ASPIRIN 81 MG PO TBEC
81.0000 mg | DELAYED_RELEASE_TABLET | Freq: Every day | ORAL | Status: DC
  Administered 2023-07-15 – 2023-07-21 (×7): 81 mg via ORAL
  Filled 2023-07-15 (×7): qty 1

## 2023-07-15 MED ORDER — INSULIN ASPART 100 UNIT/ML IJ SOLN
0.0000 [IU] | Freq: Every day | INTRAMUSCULAR | Status: DC
  Administered 2023-07-15: 5 [IU] via SUBCUTANEOUS

## 2023-07-15 MED ORDER — INSULIN ASPART 100 UNIT/ML IJ SOLN
0.0000 [IU] | Freq: Three times a day (TID) | INTRAMUSCULAR | Status: DC
  Administered 2023-07-16: 7 [IU] via SUBCUTANEOUS
  Administered 2023-07-16: 3 [IU] via SUBCUTANEOUS
  Administered 2023-07-17: 1 [IU] via SUBCUTANEOUS
  Administered 2023-07-17 – 2023-07-18 (×2): 2 [IU] via SUBCUTANEOUS
  Administered 2023-07-18: 5 [IU] via SUBCUTANEOUS
  Administered 2023-07-18: 1 [IU] via SUBCUTANEOUS
  Administered 2023-07-19: 2 [IU] via SUBCUTANEOUS
  Administered 2023-07-20 (×2): 1 [IU] via SUBCUTANEOUS
  Administered 2023-07-21: 3 [IU] via SUBCUTANEOUS
  Administered 2023-07-21 (×2): 1 [IU] via SUBCUTANEOUS

## 2023-07-15 MED ORDER — ONDANSETRON HCL 4 MG/2ML IJ SOLN: 4.0000 mg | Freq: Four times a day (QID) | INTRAMUSCULAR | Status: DC | PRN

## 2023-07-15 MED ORDER — INFLUENZA VAC A&B SURF ANT ADJ 0.5 ML IM SUSY
0.5000 mL | PREFILLED_SYRINGE | INTRAMUSCULAR | Status: DC
  Filled 2023-07-15: qty 0.5

## 2023-07-15 MED ORDER — ACETAMINOPHEN 325 MG PO TABS
650.0000 mg | ORAL_TABLET | Freq: Four times a day (QID) | ORAL | Status: DC | PRN
  Administered 2023-07-15 – 2023-07-21 (×7): 650 mg via ORAL
  Filled 2023-07-15 (×8): qty 2

## 2023-07-15 MED ORDER — LEVOTHYROXINE SODIUM 88 MCG PO TABS
88.0000 ug | ORAL_TABLET | Freq: Every day | ORAL | Status: DC
  Administered 2023-07-16 – 2023-07-21 (×6): 88 ug via ORAL
  Filled 2023-07-15 (×6): qty 1

## 2023-07-15 MED ORDER — SODIUM CHLORIDE 0.9 % IV SOLN: INTRAVENOUS | Status: DC

## 2023-07-15 MED ORDER — ONDANSETRON HCL 4 MG PO TABS: 4.0000 mg | ORAL_TABLET | Freq: Four times a day (QID) | ORAL | Status: DC | PRN

## 2023-07-15 MED ORDER — LACOSAMIDE 200 MG PO TABS
200.0000 mg | ORAL_TABLET | Freq: Two times a day (BID) | ORAL | Status: DC
  Administered 2023-07-15 – 2023-07-21 (×12): 200 mg via ORAL
  Filled 2023-07-15 (×12): qty 1

## 2023-07-15 MED ORDER — LOSARTAN POTASSIUM 25 MG PO TABS
25.0000 mg | ORAL_TABLET | Freq: Every day | ORAL | Status: DC
  Administered 2023-07-16 – 2023-07-21 (×6): 25 mg via ORAL
  Filled 2023-07-15 (×6): qty 1

## 2023-07-15 MED ORDER — PENTOXIFYLLINE ER 400 MG PO TBCR
400.0000 mg | EXTENDED_RELEASE_TABLET | Freq: Two times a day (BID) | ORAL | Status: DC
  Administered 2023-07-15 – 2023-07-21 (×12): 400 mg via ORAL
  Filled 2023-07-15 (×13): qty 1

## 2023-07-15 MED ORDER — LEVETIRACETAM IN NACL 1000 MG/100ML IV SOLN
1000.0000 mg | Freq: Two times a day (BID) | INTRAVENOUS | Status: DC
  Administered 2023-07-15 – 2023-07-17 (×4): 1000 mg via INTRAVENOUS
  Filled 2023-07-15 (×4): qty 100

## 2023-07-15 MED ORDER — ACETAMINOPHEN 650 MG RE SUPP: 650.0000 mg | Freq: Four times a day (QID) | RECTAL | Status: DC | PRN

## 2023-07-15 MED ORDER — LACOSAMIDE 200 MG PO TABS: 200.0000 mg | ORAL_TABLET | Freq: Two times a day (BID) | ORAL | Status: AC

## 2023-07-15 NOTE — Progress Notes (Signed)
NEUROLOGY CONSULT FOLLOW UP NOTE   Date of service: July 15, 2023 Patient Name: Chris [name] MRN:  [id number] DOB:  Sep 14, 1941  Brief HPI  Chris [name] is a 82 y.o. male who presented with  multiple recurrent seizures with continued waxing/waning transferred for LTM EEG.    Interval Hx/subjective   No further seizures  Vitals   There were no vitals filed for this visit.   There is no height or weight on file to calculate BMI.  Physical Exam   General: In bed, bandage in place on the forehead  Neurologic Examination   MS: Awake, alert knows where he is, gives the correct month and year CN: Visual fields are full, EOMI Motor: Full strength throughout Sensory: Intact to light touch  Labs and Diagnostic Imaging   CBC:  Recent Labs  Lab 07/14/23 0320 07/15/23 0607  WBC 12.0* 12.5*  NEUTROABS 9.4*  --   HGB 13.2 11.8*  HCT 40.3 34.4*  MCV 94.4 91.5  PLT 180 136*    Basic Metabolic Panel:  Lab Results  Component Value Date   NA 142 07/15/2023   K 4.1 07/15/2023   CO2 26 07/15/2023   GLUCOSE 250 (H) 07/15/2023   BUN 50 (H) 07/15/2023   CREATININE 1.28 (H) 07/15/2023   CALCIUM 8.7 (L) 07/15/2023   GFRNONAA 56 (L) 07/15/2023   GFRAA 39 (L) 07/28/2019   Lipid Panel: No results found for: "LDLCALC" HgbA1c:  Lab Results  Component Value Date   HGBA1C 6.8 (H) 07/14/2023   Urine Drug Screen:     Component Value Date/Time   LABOPIA NONE DETECTED 07/14/2023 1419   COCAINSCRNUR NONE DETECTED 07/14/2023 1419   LABBENZ NONE DETECTED 07/14/2023 1419   AMPHETMU NONE DETECTED 07/14/2023 1419   THCU NONE DETECTED 07/14/2023 1419   LABBARB NONE DETECTED 07/14/2023 1419    Alcohol Level     Component Value Date/Time   ETH <10 07/14/2023 0336   INR  Lab Results  Component Value Date   INR 1.1 07/14/2023   APTT  Lab Results  Component Value Date   APTT 29 07/14/2023    Impression   Chris [name] is a 82 y.o. male who presented  with recurrent seizures and has had persistent waxing/waning mental status with concern for intermittent focal seizures.  He is currently being managed on Vimpat 200 twice daily.  He had a least one episode concerning for seizure this morning despite Vimpat, and I would favor adding a second agent.  Recommendations  LTM EEG Continue Vimpat 200 twice daily Start Keppra 1 g twice daily Neurology will continue to follow ______________________________________________________________________   Thank you for the opportunity to take part in the care of this patient. If you have any further questions, please contact the neurology consultation team on call. Updated oncall schedule is listed on AMION.  Signed,  Ritta Slot, MD [facility] [phone]  If 7pm- 7am, please page neurology on call as listed in AMION.

## 2023-07-15 NOTE — Discharge Summary (Signed)
Physician Discharge Summary   Patient: Chris [name] MRN: [id number] DOB: [birth year]  Admit date:     07/14/2023  Discharge date: 07/15/2023  Discharge Physician: Pennie Banter   PCP: Pennie Banter, MD   Recommendations at discharge:    Transfer to [facility] for continuous EEG Follow up recs at d/c per discharging service at [facility] Follow up with outpatient Neurology at [facility]  Discharge Diagnoses: Principal Problem:   Seizure [facility])  Resolved Problems:   * No resolved hospital problems. *  Hospital Course:  " Chris [name] is a 82 y.o. 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   Patient woke up this morning around 1 AM, confused and started to have twitching like movement of left-sided and new onset right-sided weakness as well as staring and not responding.  Patient appeared to have trouble to communicate with family.  Family was concerned about stroke and called EMS.   At baseline, patient has a partial complex seizure for which she has been following with [facility] neurology since summer.  He was used to be on Keppra and due to side effect of drowsiness, antiseizure medication switched to Vimpat sometime in July, patient initially responded well however family reported that recently patient developed more staring episode and Vimpat dosage was increased couple weeks ago.   ED Course: Afebrile, nontachycardic blood pressure 160/88.  O2 saturation 100% on room air. Code stroke was called and patient was conservatively noncandidate for TNK treatment.  CT head negative for acute findings, CTA head and neck negative for large vessel occlusion.  Chest x-ray and UA pending.  Blood work WBC 12.0, hemoglobin 13, sodium 136, glucose 315, BUN  41, creatinine 1.4   Patient was becoming agitated in the ED, and had a fall hit his head and left arm.  Trauma scan of CT head and neck and facial CT showed no acute fracture or dislocation.  Given droperidol in the ED"   Pt admitted for futher work up and Neurology consulted.  Transfer to [facility] recommended for continuous EEG.  Pt is stable for transfer  10/22 -- pt seen with wife and sitter present this AM. CareLine arriving for transfer to [facility]  Patient's wife updated on plan recommended per Neurology and is agreeable.    Assessment and Plan:  Acute metabolic encephalopathy -Suspect status epilepticus -1 mg IV Ativan given had some effect on the twitching movement of the left side and patient became very sleepy.   Neurology Dr. Selina Cooley consulted --  , recommended loading patient with Vimpat 200 mg IV x 1 and continue home dose of 100 mg twice daily.  Contacted [facility] transplant center for transfer patient to a tertiary center for continuous EEG monitoring.  Both [facility] and [facility] have no bed available and neither offers waiting list, contact [facility] to set up transfer. Family updated about the plan. -MRI when patient is more stable -Routine EEG - PENDING --TRANSFER FOR CONTINUOUS EEG RECOMMENDED BY NEUROLOGY -Other DDx, patient does not have any new cough, unable to assess symptoms such as dysuria at this point and family denied patient has diarrhea.  UA and x-ray of chest sent.  Monitor off antibiotics for now   Status epilepticus Breakthrough seizure -Admitted to PCU for  close monitoring -Frequent neurochecks -Fall precaution, seizure precaution -Antiseizure medication as above   Question of stroke -MRI when patient mentation stabilized -Permissive hypertension, hold off home BP meds -start as needed hydralazine   Chronic left foot ulcer -No symptoms or signs of active infection.  Left foot x-ray showed no osteomyelitis and only chronic.  Changes.  Was not seen by  podiatry 2 and half weeks ago.        Consultants: Neurology Procedures performed: as above  Disposition:  transfer to [facility] Diet recommendation:   DISCHARGE MEDICATION: Allergies as of 07/15/2023       Reactions   Lisinopril Other (See Comments)   HyperK   No Known Allergies         Medication List     STOP taking these medications    B-12 2500 MCG Tabs   isosorbide mononitrate 30 MG 24 hr tablet Commonly known as: IMDUR   multivitamin with minerals tablet   omega-3 acid ethyl esters 1 g capsule Commonly known as: LOVAZA   permethrin 5 % cream Commonly known as: ELIMITE       TAKE these medications    aspirin EC 81 MG tablet Take 81 mg by mouth daily.   cetirizine 10 MG tablet Commonly known as: ZYRTEC Take 10 mg by mouth daily as needed for allergies.   Farxiga 10 MG Tabs tablet Generic drug: dapagliflozin propanediol Take 10 mg by mouth daily.   furosemide 40 MG tablet Commonly known as: LASIX TAKE 1 TABLET(40 MG) BY MOUTH EVERY DAY   gabapentin 300 MG capsule Commonly known as: NEURONTIN Take 3 capsules by mouth 2 (two) times daily.   lacosamide 200 MG Tabs tablet Commonly known as: VIMPAT Take 1 tablet (200 mg total) by mouth 2 (two) times daily. What changed:  medication strength how much to take when to take this   levothyroxine 88 MCG tablet Commonly known as: SYNTHROID TAKE 1 TABLET BY MOUTH DAILY AT 6 AM   lidocaine 5 % ointment Commonly known as: XYLOCAINE Apply 1 application topically as needed.   pentoxifylline 400 MG CR tablet Commonly known as: TRENTAL TAKE 1 TABLET(400 MG) BY MOUTH TWICE DAILY        Discharge Exam: Filed Weights   07/14/23 0333  Weight: 92.4 kg   General exam: awake, alert, no acute distress, confused but calm HEENT: moist mucus membranes, hearing grossly normal  Respiratory system: CTAB, no wheezes, rales or rhonchi, normal respiratory effort. Cardiovascular system: normal S1/S2,  RRR Gastrointestinal system: soft, NT, ND Central nervous system: A&O x self. no gross focal neurologic deficits, normal speech Extremities: mittens on b/l hands Skin: dry, intact, normal temperature Psychiatry: normal mood, congruent affect, abnomral judgement and insight    Condition at discharge: stable  The results of significant diagnostics from this hospitalization (including imaging, microbiology, ancillary and laboratory) are listed below for reference.   Imaging Studies: DG Lumbar Spine 2-3 Views  Result Date: 07/17/2023 CLINICAL DATA:  Low back, history of prior lumbar spine surgery EXAM: LUMBAR SPINE-5 lumbar-type vertebral bodies are presumed.  3 VIEW COMPARISON:  None Available. FINDINGS: Evaluation is limited by osteopenia and overlying bowel gas. There is no evidence of lumbar spine fracture. No significant listhesis. Facet arthropathy, worst in the lower lumbar spine. Degenerative changes in the lumbar spine, with disc height loss most prominent at T12-L1, L4-L5, and L5-S1. Additional degenerative changes in the bilateral sacroiliac joints. IMPRESSION: Evaluation is limited by osteopenia and overlying bowel gas. Within this  limitation, no definite acute fracture. Degenerative changes, with disc height loss and facet arthropathy. Electronically Signed   By: Wiliam Ke M.D.   On: 07/17/2023 16:25   Overnight EEG with video  Result Date: 07/17/2023 Charlsie Quest, MD     07/17/2023  2:12 PM Patient Name: Keelin Albracht MRN: [id number] Epilepsy Attending: Charlsie Quest Referring Physician/Provider: Rejeana Brock, MD Duration: 07/16/2023 1045 to 07/17/2023 1045 Patient history:  82 y.o. male who presented with recurrent seizures and has had persistent waxing/waning mental status. EEG to evaluate for intermittent focal seizures.  Level of alertness: Awake, asleep AEDs during EEG study: LEV Technical aspects: This EEG study was done with scalp electrodes positioned  according to the 10-20 International system of electrode placement. Electrical activity was reviewed with band pass filter of 1-70Hz , sensitivity of 7 uV/mm, display speed of 48mm/sec with a 60Hz  notched filter applied as appropriate. EEG data were recorded continuously and digitally stored.  Video monitoring was available and reviewed as appropriate. Description: The posterior dominant rhythm consists of 8 Hz activity of moderate voltage (25-35 uV) seen predominantly in posterior head regions, symmetric and reactive to eye opening and eye closing. Sleep was characterized by vertex waves, sleep spindles (12 to 14 Hz), maximal frontocentral region. EEG showed continuous generalized 3 to 6 Hz theta-delta slowing. Hyperventilation and photic stimulation were not performed.   ABNORMALITY - Continuous slow, generalized IMPRESSION: This study is suggestive of mild to moderate diffuse encephalopathy. No seizures or epileptiform discharges were seen throughout the recording. Charlsie Quest   MR BRAIN WO CONTRAST  Result Date: 07/15/2023 CLINICAL DATA:  Seizure with right-sided weakness and aphasia EXAM: MRI HEAD WITHOUT CONTRAST TECHNIQUE: Multiplanar, multiecho pulse sequences of the brain and surrounding structures were obtained without intravenous contrast. COMPARISON:  11/19/2022 FINDINGS: Motion degraded examination. Brain: No acute infarct, mass effect or extra-axial collection. No acute or chronic hemorrhage. There is multifocal hyperintense T2-weighted signal within the white matter. Parenchymal volume and CSF spaces are normal. The midline structures are normal. Vascular: Normal flow voids. Skull and upper cervical spine: Right frontal scalp hematoma. Sinuses/Orbits:No paranasal sinus fluid levels or advanced mucosal thickening. No mastoid or middle ear effusion. Normal orbits. IMPRESSION: 1. Motion degraded examination. 2. No acute intracranial abnormality. 3. Right frontal scalp hematoma. 4. Findings of  chronic small vessel ischemia. Electronically Signed   By: Deatra Robinson M.D.   On: 07/15/2023 01:48   EEG adult  Result Date: 07/14/2023 Jefferson Fuel, MD     07/14/2023  7:44 PM Routine EEG Report [place] is a 82 y.o. male with a history of seizures who is undergoing an EEG to evaluate for seizures. Report: This EEG was acquired with electrodes placed according to the International 10-20 electrode system (including Fp1, Fp2, F3, F4, C3, C4, P3, P4, O1, O2, T3, T4, T5, T6, A1, A2, Fz, Cz, Pz). The following electrodes were missing or displaced: none. The occipital dominant rhythm was 7 Hz with overriding beta frequencies. This activity is reactive to stimulation. Drowsiness was manifested by background fragmentation; deeper stages of sleep were identified by K complexes and sleep spindles. There was left focal slowing. There were no interictal epileptiform discharges. There were no electrographic seizures identified. Photic stimulation and hyperventilation were not performed. Impression and clinical correlation: This EEG was obtained while awake and asleep and is abnormal due to: - mild diffuse slowing indicative of global cerebral dysfunction - left focal slowing indicative of superimposed focal cerebral dysfunction in  that area Epileptiform abnormalities were not seen during this recording. Bing Neighbors, MD [facility] [phone] If 7pm- 7am, please page neurology on call as listed in AMION.   DG Chest 1 View  Result Date: 07/14/2023 CLINICAL DATA:  Seizure. EXAM: CHEST  1 VIEW COMPARISON:  07/28/2019. FINDINGS: Bilateral lung fields are clear. Bilateral costophrenic angles are clear. Normal cardio-mediastinal silhouette. There are surgical staples along the heart border and sternotomy wires, status post CABG (coronary artery bypass graft). No acute osseous abnormalities. The soft tissues are within normal limits. IMPRESSION: *No active disease. Electronically Signed   By: Jules Schick M.D.   On: 07/14/2023 14:31   CT Head Wo Contrast  Result Date: 07/14/2023 CLINICAL DATA:  Neck trauma.  Fall with head trauma. EXAM: CT HEAD WITHOUT CONTRAST CT MAXILLOFACIAL WITHOUT CONTRAST CT CERVICAL SPINE WITHOUT CONTRAST TECHNIQUE: Multidetector CT imaging of the head, cervical spine, and maxillofacial structures were performed using the standard protocol without intravenous contrast. Multiplanar CT image reconstructions of the cervical spine and maxillofacial structures were also generated. RADIATION DOSE REDUCTION: This exam was performed according to the departmental dose-optimization program which includes automated exposure control, adjustment of the mA and/or kV according to patient size and/or use of iterative reconstruction technique. COMPARISON:  None Available. FINDINGS: CT HEAD FINDINGS Brain: No evidence of acute infarction, hemorrhage, hydrocephalus, extra-axial collection or mass lesion/mass effect. Vascular: No hyperdense vessel or unexpected calcification. Skull: Normal. Negative for fracture or focal lesion. CT MAXILLOFACIAL FINDINGS Osseous: No fracture or mandibular dislocation. No destructive process. Orbits: Negative. No traumatic or inflammatory finding. Sinuses: Clear Soft tissues: Negative CT CERVICAL SPINE FINDINGS Alignment: Normal Skull base and vertebrae: No acute fracture. No primary bone lesion or focal pathologic process. C4-C7 ACDF. Bridging spondylitic spurs at C2-3 and C7-T1. Soft tissues and spinal canal: No prevertebral fluid or swelling. No visible canal hematoma. Disc levels: C3-4 bilateral foraminal narrowing from disc height loss and ridging. No bony spinal canal impingement detected. Upper chest: No visible injury IMPRESSION: No evidence of intracranial or cervical spine injury. Negative for facial fracture. Electronically Signed   By: Tiburcio Pea M.D.   On: 07/14/2023 06:43   CT Maxillofacial Wo Contrast  Result Date: 07/14/2023 CLINICAL DATA:   Neck trauma.  Fall with head trauma. EXAM: CT HEAD WITHOUT CONTRAST CT MAXILLOFACIAL WITHOUT CONTRAST CT CERVICAL SPINE WITHOUT CONTRAST TECHNIQUE: Multidetector CT imaging of the head, cervical spine, and maxillofacial structures were performed using the standard protocol without intravenous contrast. Multiplanar CT image reconstructions of the cervical spine and maxillofacial structures were also generated. RADIATION DOSE REDUCTION: This exam was performed according to the departmental dose-optimization program which includes automated exposure control, adjustment of the mA and/or kV according to patient size and/or use of iterative reconstruction technique. COMPARISON:  None Available. FINDINGS: CT HEAD FINDINGS Brain: No evidence of acute infarction, hemorrhage, hydrocephalus, extra-axial collection or mass lesion/mass effect. Vascular: No hyperdense vessel or unexpected calcification. Skull: Normal. Negative for fracture or focal lesion. CT MAXILLOFACIAL FINDINGS Osseous: No fracture or mandibular dislocation. No destructive process. Orbits: Negative. No traumatic or inflammatory finding. Sinuses: Clear Soft tissues: Negative CT CERVICAL SPINE FINDINGS Alignment: Normal Skull base and vertebrae: No acute fracture. No primary bone lesion or focal pathologic process. C4-C7 ACDF. Bridging spondylitic spurs at C2-3 and C7-T1. Soft tissues and spinal canal: No prevertebral fluid or swelling. No visible canal hematoma. Disc levels: C3-4 bilateral foraminal narrowing from disc height loss and ridging. No bony spinal canal impingement detected. Upper chest: No  visible injury IMPRESSION: No evidence of intracranial or cervical spine injury. Negative for facial fracture. Electronically Signed   By: Tiburcio Pea M.D.   On: 07/14/2023 06:43   CT Cervical Spine Wo Contrast  Result Date: 07/14/2023 CLINICAL DATA:  Neck trauma.  Fall with head trauma. EXAM: CT HEAD WITHOUT CONTRAST CT MAXILLOFACIAL WITHOUT CONTRAST CT  CERVICAL SPINE WITHOUT CONTRAST TECHNIQUE: Multidetector CT imaging of the head, cervical spine, and maxillofacial structures were performed using the standard protocol without intravenous contrast. Multiplanar CT image reconstructions of the cervical spine and maxillofacial structures were also generated. RADIATION DOSE REDUCTION: This exam was performed according to the departmental dose-optimization program which includes automated exposure control, adjustment of the mA and/or kV according to patient size and/or use of iterative reconstruction technique. COMPARISON:  None Available. FINDINGS: CT HEAD FINDINGS Brain: No evidence of acute infarction, hemorrhage, hydrocephalus, extra-axial collection or mass lesion/mass effect. Vascular: No hyperdense vessel or unexpected calcification. Skull: Normal. Negative for fracture or focal lesion. CT MAXILLOFACIAL FINDINGS Osseous: No fracture or mandibular dislocation. No destructive process. Orbits: Negative. No traumatic or inflammatory finding. Sinuses: Clear Soft tissues: Negative CT CERVICAL SPINE FINDINGS Alignment: Normal Skull base and vertebrae: No acute fracture. No primary bone lesion or focal pathologic process. C4-C7 ACDF. Bridging spondylitic spurs at C2-3 and C7-T1. Soft tissues and spinal canal: No prevertebral fluid or swelling. No visible canal hematoma. Disc levels: C3-4 bilateral foraminal narrowing from disc height loss and ridging. No bony spinal canal impingement detected. Upper chest: No visible injury IMPRESSION: No evidence of intracranial or cervical spine injury. Negative for facial fracture. Electronically Signed   By: Tiburcio Pea M.D.   On: 07/14/2023 06:43   DG Foot 2 Views Left  Result Date: 07/14/2023 CLINICAL DATA:  Pain. Wound on left foot. Evaluate for osteomyelitis. 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 There is been surgical resection of  the distal aspect of the fourth metatarsal bone and fifth metatarsal bone. There are no signs of underlying acute fracture or dislocation. No focal bone erosions identified to indicate osteomyelitis. Vascular calcifications noted. Calcaneal heel spurs. IMPRESSION: 1. No acute findings. 2. 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 3. Surgical resection of the distal aspect of the fourth and fifth metatarsal bones. Electronically Signed   By: Signa Kell M.D.   On: 07/14/2023 05:46   CT ANGIO HEAD NECK W WO CM W PERF (CODE STROKE)  Result Date: 07/14/2023 CLINICAL DATA:  Slurred speech EXAM: CT ANGIOGRAPHY HEAD AND NECK TECHNIQUE: Multidetector CT imaging of the head and neck was performed using the standard protocol during bolus administration of intravenous contrast. Multiplanar CT image reconstructions and MIPs were obtained to evaluate the vascular anatomy. Carotid stenosis measurements (when applicable) are obtained utilizing NASCET criteria, using the distal internal carotid diameter as the denominator. RADIATION DOSE REDUCTION: This exam was performed according to the departmental dose-optimization program which includes automated exposure control, adjustment of the mA and/or kV according to patient size and/or use of iterative reconstruction technique. CONTRAST:  OMNIPAQUE IOHEXOL 350 MG/ML SOLN COMPARISON:  Head CT from earlier today FINDINGS: CTA NECK FINDINGS Aortic arch: Atheromatous plaque.  Three vessel branching. Right carotid system: Mixed density plaque mainly at the bifurcation without flow reducing stenosis or ulceration. Left carotid system: Mixed density plaque mainly at the bifurcation without flow reducing stenosis or ulceration. Vertebral arteries: No proximal  subclavian stenosis. The right vertebral artery is dominant. No vertebral flow reducing stenosis, beading, or dissection. Skeleton: Generalized cervical solid arthrodesis and spondylitic  ankylosis. Other neck: No acute finding Upper chest: No acute finding Review of the MIP images confirms the above findings CTA HEAD FINDINGS Anterior circulation: Extensive atheromatous plaque on the carotid siphons. No branch occlusion, beading, or aneurysm. Posterior circulation: Right dominant vertebral artery. Mild atheromatous plaque to the basilar. No branch occlusion, beading, aneurysm, or flow reducing stenosis. Venous sinuses: Unremarkable for contrast timing Anatomic variants: As above Review of the MIP images confirms the above findings IMPRESSION: 1. No emergent finding. 2. Atherosclerosis without flow reducing stenosis or ulceration of major arteries in the head and neck. Electronically Signed   By: Tiburcio Pea M.D.   On: 07/14/2023 04:08   CT HEAD CODE STROKE WO CONTRAST  Result Date: 07/14/2023 CLINICAL DATA:  Code stroke.  Slurred speech, right-sided weakness EXAM: CT HEAD WITHOUT CONTRAST TECHNIQUE: Contiguous axial images were obtained from the base of the skull through the vertex without intravenous contrast. RADIATION DOSE REDUCTION: This exam was performed according to the departmental dose-optimization program which includes automated exposure control, adjustment of the mA and/or kV according to patient size and/or use of iterative reconstruction technique. COMPARISON:  11/18/2022 FINDINGS: Brain: No evidence of acute infarction, hemorrhage, mass, mass effect, or midline shift. No hydrocephalus or extra-axial collection. Vascular: No hyperdense vessel. Atherosclerotic calcifications in the intracranial carotid and vertebral arteries. Skull: Negative for fracture or focal lesion. Sinuses/Orbits: No acute finding. Other: The mastoid air cells are well aerated. ASPECTS [facility] Stroke Program Early CT Score) - Ganglionic level infarction (caudate, lentiform nuclei, internal capsule, insula, M1-M3 cortex): 7 - Supraganglionic infarction (M4-M6 cortex): 3 Total score (0-10 with 10 being  normal): 10 IMPRESSION: No evidence of acute intracranial process. ASPECTS is 10. Code stroke imaging results were communicated on 07/14/2023 at 3:25 am to provider [facility] via telephone, who verbally acknowledged these results. Electronically Signed   By: Wiliam Ke M.D.   On: 07/14/2023 03:26    Microbiology: Results for orders placed or performed during the hospital encounter of 10/18/19  SARS CORONAVIRUS 2 (TAT 6-24 HRS) Nasopharyngeal Nasopharyngeal Swab     Status: None   Collection Time: 10/18/19 12:41 PM   Specimen: Nasopharyngeal Swab  Result Value Ref Range Status   SARS Coronavirus 2 NEGATIVE NEGATIVE Final    Comment: (NOTE) SARS-CoV-2 target nucleic acids are NOT DETECTED. The SARS-CoV-2 RNA is generally detectable in upper and lower respiratory specimens during the acute phase of infection. Negative results do not preclude SARS-CoV-2 infection, do not rule out co-infections with other pathogens, and should not be used as the sole basis for treatment or other patient management decisions. Negative results must be combined with clinical observations, patient history, and epidemiological information. The expected result is Negative. Fact Sheet for Patients: HairSlick.no Fact Sheet for Healthcare Providers: quierodirigir.com This test is not yet approved or cleared by the [place] FDA and  has been authorized for detection and/or diagnosis of SARS-CoV-2 by FDA under an Emergency Use Authorization (EUA). This EUA will remain  in effect (meaning this test can be used) for the duration of the COVID-19 declaration under Section 56 4(b)(1) of the Act, 21 U.S.C. section 360bbb-3(b)(1), unless the authorization is terminated or revoked sooner. Performed at [facility], [address] [place], [place] [address]     Labs: CBC: Recent Labs  Lab 07/18/23 1020 07/21/23 0503  WBC 5.3 7.5  HGB 10.2* 11.0*  HCT  31.1* 33.6*  MCV 93.4 94.9  PLT 120* 174   Basic Metabolic Panel: Recent Labs  Lab 07/21/23 0503  NA 136  K 5.1  CL 101  CO2 27  GLUCOSE 141*  BUN 17  CREATININE 1.04  CALCIUM 8.5*  MG 2.2  PHOS 3.7   Liver Function Tests: No results for input(s): "AST", "ALT", "ALKPHOS", "BILITOT", "PROT", "ALBUMIN" in the last 168 hours.  CBG: Recent Labs  Lab 07/21/23 0603 07/21/23 0809 07/21/23 1207 07/21/23 1542 07/21/23 1746  GLUCAP 135* 242* 128* 124* 125*    Discharge time spent: less than 30 minutes.  Signed: Pennie Banter, DO [facility] 07/23/2023

## 2023-07-15 NOTE — Progress Notes (Signed)
Patient just arrived to [facility] and has an active head bleed. Patient not ready at this time. RN will message Tech when ready.

## 2023-07-15 NOTE — Progress Notes (Signed)
Speech Language Pathology Treatment: Dysphagia  Patient Details Name: Chris [name] MRN: [id number] DOB: Jan 08, 1941 Today's Date: 07/15/2023 Time: 2440-1027 SLP Time Calculation (min) (ACUTE ONLY): 40 min  Assessment / Plan / Recommendation Clinical Impression  Pt seen for ongoing assessment of swallowing; trials to upgrade diet as appropriate. Pt much more awake/alert this morning vs yesterday -- maintaining eyes open w/out constant verbal/tactile stim as well as engaged in simple verbal engagement. Improved orientation to setting/Chris [name]. He participated functionally and answered few basic questions re: self. Speech was intelligible; cough was more productive, strong. Some continued Confusion/behavior per NSG. [place] o2 support 2L; afebrile. WBC 12.8.   Pt explained general aspiration precautions and agreed verbally to the need for following them especially sitting Upright for all oral intake; need for Small bites/sips Slowly. Supported behind the shoulders and head w/ towel roll for head forward positioning. Pt required positioning support d/t overall weakness.  He could not help to feed himself -- Bilateral Mitts on hands. Post oral care, pt was presented w/ trials of single ice chips, Nectar liquids via tsp/straw, and purees. No overt clinical s/s of aspiration were noted w/ trials given; no overt coughing, respiratory status remained calm and unlabored w/ no tachypnea, vocal quality clear b/t trials when assessed. O2 sats remained upper 90s when checked. Oral phase appeared [facility] for bolus management and timely A-P transfer for swallowing; oral clearing achieved w/ all consistencies. NSG denied any deficits in swallowing of purees prior.  No other consistencies given today at this session -- determined pt appeared to need more Time for continued medical improvement first d/t overall status/presentation.    Pt appears improved in medical status today overall per MD consult. W/ ongoing po trials of  modified consistencies, he appears to present w/ reduced risk for aspiration/aspiration pneumonia when following general aspiration precautions.  Recommend a more pureed diet(Dysphagia level 1) w/ Nectar liquids diet. Gravies added to moisten foods. Recommend aspiration precautions including small bites/sips slowly; less talking/distractions during meals; upright sitting; monitor straw use; engage pt in self-feeding as much as possible. Pills CRUSHED vs Whole in Puree moving forward for safety of swallow.  ST services will continue to follow pt for ongoing assessment of swallowing while admitted; trials to upgrade diet as appropriate. MD/NSG updated, agreed. Precautions posted at bedside and provided to Chris [name].      HPI HPI: Pt is a 82 y.o. 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     Patient woke up this morning around 1 AM, confused and started to have twitching like movement of left-sided and new onset right-sided weakness as well as staring and not responding.  Patient appeared to have trouble communicating with family.  Family was concerned about stroke and called EMS.     At baseline, patient has a partial complex seizure for which she has been following with [facility] neurology since summer.  He was used to be on Keppra and due to side effect of drowsiness, antiseizure medication switched to Vimpat sometime in July, patient initially responded well however family reported that recently patient developed more staring episode and Vimpat dosage was increased couple weeks ago.    Head CT: No emergent findings.   CXR: no acute processes.      OF NOTE: per chart  and Chris [name]'s report: "Pt does have a baseline of c/o "2" GI Barium swallow studies for Esophageal phase dysmotility issues ~2  years ago. He denied any current s/s of aspiration but describes difficulty swallowing such foods as meats, rice pointing to her sternal notch-mid sternum areas -- he described a "ledge" there that bothers him when eating certain foods at home.".      SLP Plan  Continue with current plan of care      Recommendations for follow up therapy are one component of a multi-disciplinary discharge planning process, led by the attending physician.  Recommendations may be updated based on patient status, additional functional criteria and insurance authorization.    Recommendations  Diet recommendations: Dysphagia 1 (puree);Nectar-thick liquid Liquids provided via: Teaspoon;Straw;Cup (pinched straw if needed) Medication Administration: Crushed with puree Supervision: Staff to assist with self feeding;Full supervision/cueing for compensatory strategies Compensations: Minimize environmental distractions;Slow rate;Small sips/bites;Lingual sweep for clearance of pocketing;Follow solids with liquid Postural Changes and/or Swallow Maneuvers: Out of bed for meals;Seated upright 90 degrees;Upright 30-60 min after meal                 (Dietician f/u) Oral care BID;Oral care before and after PO;Staff/trained caregiver to provide oral care   Frequent or constant Supervision/Assistance Dysphagia, oropharyngeal phase (R13.12) (mental status decline)     Continue with current plan of care        Jerilynn Som, MS, CCC-SLP Speech Language Pathologist Rehab Services; [facility] [place] [phone] (ascom) Nakima Fluegge  07/15/2023, 3:57 PM

## 2023-07-15 NOTE — Significant Event (Signed)
pt arrived to the floor from the ED somnolent , pupils 2mm nonreactive, head injury from a fall in the ED, history of seizures, shallow breaths, Nihs of 17 , very rigid with tremors . Sats 100% but has periods of apnea. Maintaining airway protection currently. Follows commands words initially incomprehensible.  Throughout shift pt becomes more agitated and alert to self. Pupils sluggish . MD made aware . Pt is stable.

## 2023-07-15 NOTE — H&P (Signed)
History and Physical    Patient: Chris [name] ZOX:[id number] DOB: 07/04/1941 DOA: 07/15/2023 DOS: the patient was seen and examined on 07/15/2023 PCP: Hoover Browns, MD  Patient coming from: Transfer from [facility]  Chief Complaint: Altered  HPI: Chris [name] is a 82 y.o. male with medical history significant of hypertension, hyperlipidemia, CAD s/p four-vessel CABG, TIA, IDDM, osteomyelitis s/p right second toe amputation, and partial complex seizures who presents after being noted to be acutely altered.  Apparently patient had woken up around 1 AM yesterday morning confused with twitching like movement of the left side, new right sided weakness, staring spells, and difficulty communicating.  Family was concerned that patient had possibly had a stroke and called EMS.  He is noted to be followed at [facility] neurology since the summer for history of seizures.  He had been on Keppra, but had reported significant drowsiness for which she was switched to Vimpat in July.  He had been initially seen at [facility] as a code stroke. CT head negative for acute findings.  CTA head and neck negative for large vessel occlusion.  He was not thought to be a thrombolytics candidate.  Patient was noted to be afebrile with blood pressures elevated up to 160/88, and all other vital signs maintained..  Labs from 10/21 noted WBC 12, BUN 41, creatinine 1.43, glucose 315, hemoglobin A1c 6.8.  While in the ED patient had become agitated and fell out of bed hitting his head and left arm.  Trauma scans were obtained CT of the head and facial CT showed no acute fracture or dislocation.  He had been given droperidol in the ED.  EEG had showed diffuse slowing with superimposed left focal slowing and overriding beta frequencies.  Neurology had recommended transfer for continuous EEG monitoring due to concern for status epilepticus.  Patient had been given 1 mg a Ativan and then loaded with 200 mg of Vimpat IV.  Attempts have  been made to contact [facility] transfer center, but they were not accepting transfers at this time.  MRI of the brain negative for any acute findings.  Review of Systems: As mentioned in the history of present illness. All other systems reviewed and are negative. Past Medical History:  Diagnosis Date   Coronary artery disease 2006   4-vessel CABG   Hyperlipidemia    Hypertension    TIA (transient ischemic attack)    Type 2 diabetes mellitus (HCC)    Past Surgical History:  Procedure Laterality Date   BYPASS GRAFT  2006   4-vessel   TOE AMPUTATION Left    Social History:  reports that he has never smoked. He has never used smokeless tobacco. He reports that he does not currently use alcohol. He reports that he does not use drugs.  Allergies  Allergen Reactions   Lisinopril Other (See Comments)    HyperK   No Known Allergies     Family History  Problem Relation Age of Onset   Seizures Brother    Diabetes Mother    Heart disease Sister     Prior to Admission medications   Medication Sig Start Date End Date Taking? Authorizing Provider  aspirin EC 81 MG tablet Take 81 mg by mouth daily.    [provider]  atorvastatin (LIPITOR) 40 MG tablet Take 40 mg by mouth daily at 6 PM.  08/11/18   [provider]  cetirizine (ZYRTEC) 10 MG tablet Take 10 mg by mouth daily.    [provider]  co-enzyme Q-10 30 MG capsule Take 30 mg by mouth daily.    [provider]  dapagliflozin propanediol (FARXIGA) 10 MG TABS tablet Take 10 mg by mouth daily.    [provider]  furosemide (LASIX) 40 MG tablet TAKE 1 TABLET(40 MG) BY MOUTH EVERY DAY 09/22/18   [provider]  gabapentin (NEURONTIN) 300 MG capsule TAKE 4 CAPSULES(1200 MG) BY MOUTH TWICE DAILY 09/22/18   [provider]  insulin lispro (HUMALOG) 100 UNIT/ML KwikPen Inject 0-15 Units into the skin 3 (three) times daily. 01/03/23   [provider]  insulin lispro  protamine-lispro (HUMALOG 75/25 MIX) (75-25) 100 UNIT/ML SUSP injection Inject 30 Units into the skin 2 (two) times daily. 07/20/19   Ramonita Lab, MD  lacosamide (VIMPAT) 200 MG TABS tablet Take 1 tablet (200 mg total) by mouth 2 (two) times daily. 07/15/23   Pennie Banter, DO  levothyroxine (SYNTHROID, LEVOTHROID) 88 MCG tablet TAKE 1 TABLET BY MOUTH DAILY AT 6 AM 10/29/18   [provider]  lidocaine (XYLOCAINE) 5 % ointment Apply 1 application topically as needed. 09/23/19   Helane Gunther, DPM  losartan (COZAAR) 25 MG tablet Take 25 mg by mouth daily.     [provider]  pentoxifylline (TRENTAL) 400 MG CR tablet TAKE 1 TABLET(400 MG) BY MOUTH TWICE DAILY 07/29/18   [provider]    Physical Exam: There were no vitals filed for this visit. Exam  Constitutional: Elderly male who appears to be in some discomfort. Eyes: PERRL, lids and conjunctivae normal.  Bruising noted periorbitally ENMT: Mucous membranes are moist.   Neck: normal, supple, no masses, no thyromegaly Respiratory: clear to auscultation bilaterally, no wheezing, no crackles. Normal respiratory effort. No accessory muscle use.  Cardiovascular: Regular rate and rhythm, no murmurs / rubs / gallops. No extremity edema. 2+ pedal pulses. No carotid bruits.  Abdomen: no tenderness, no masses palpated. No hepatosplenomegaly. Bowel sounds positive.  Musculoskeletal: no clubbing / cyanosis.  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 Skin:  Hematoma to the front of the head currently bandaged. Neurologic: CN 2-12 grossly intact. Strength 5/5 in all 4.  Psychiatric:  Alert and oriented x 3. Normal mood.   Data Reviewed:  EKG had revealed accelerated junctional rhythm at 72 bpm.  Reviewed labs, imaging, pertinent records as documented.  Assessment and Plan:  Acute metabolic encephalopathy Suspected status epilepticus Patient presented initially to the outside hospital yesterday morning after  being noted to be acutely altered withconfused with twitching like movement of the left side, new right sided weakness, staring spells, and difficulty communicating.  Initial concern was for possible stroke.  CT and subsequent CTA of the head and neck did not note any acute abnormality or large vessel occlusion.  Patient was not a thrombolytics candidate.  Subsequent MRI of the brain was negative.  EEG had noted diffuse slowing with superimposed L focal slowing and overriding beta frequencies for which there was concern patient may have been in status.  He had been given Ativan IV and subsequently loaded with Vimpat 200 mg IV. -Admit to a progressive bed -Seizure precautions -Neurochecks -Continuous overnight EEG monitoring -Continue Vimpat -Appreciate neurology consultative services,  will follow-up for any further recommendations  Gait disturbance Fall Scalp laceration Patient was noted to be unsteady on his feet and had fallen while in the ED prior to transfer hitting his head.  CT scan head, maxillofacial, and cervical spine did not note any acute fracture.  After transfer patient noted to have significant bleeding from the scalp laceration which had been previously managed with Steri-Strips. -Set bed alarm -PT/OT to eval and treat -General Surgery consulted to repair scalp laceration.   Controlled diabetes mellitus type 2, without use of insulin This morning blood sugar elevated at 250 without elevated anion gap.  Hemoglobin A1c noted to be 6.8 on 10/21.  Patient was discharged on Farxiga 10 mg 75/25 insulin 30 units twice daily, and lispro insulin 0 to 15 units 3 times daily with meals. -Hypoglycemic protocols -Continue Farxiga -CBGs before every meal with sensitive SSI  Acute kidney injury Improving.  Creatinine initially noted to be elevated up to 1.43 with BUN 41 on 10/21.  Patient having given IV fluids with repeat manual 1.28 with BUN 50.  Elevated BUN to creatinine ratio suggest  prerenal cause of symptoms.  Patient creatinine previously had been around 1 on care everywhere records from September. -Normal saline IV fluids at 100 mL/h -Recheck kidney function in a.m.  Chronic left foot ulcer Peripheral vascular disease No signs for infection appreciated on exam.  X-rays did not note any signs for osteomyelitis. -Continue aspirin and pentoxifylline  Essential hypertension Home blood pressure regimen includes losartan 25 mg daily. -Continue losartan  Hypothyroidism  -Check TSH -Continue levothyroxine  Hyperlipidemia -Continue atorvastatin  DVT prophylaxis: SCDs Advance Care Planning:   Code Status: Full Code   Consults: Neurology, general surgery  Family Communication:   Severity of Illness: The appropriate patient status for this patient is INPATIENT. Inpatient status is judged to be reasonable and necessary in order to provide the required intensity of service to ensure the patient's safety. The patient's presenting symptoms, physical exam findings, and initial radiographic and laboratory data in the context of their chronic comorbidities is felt to place them at high risk for further clinical deterioration. Furthermore, it is not anticipated that the patient will be medically stable for discharge from the hospital within 2 midnights of admission.   * I certify that at the point of admission it is my clinical judgment that the patient will require inpatient hospital care spanning beyond 2 midnights from the point of admission due to high intensity of service, high risk for further deterioration and high frequency of surveillance required.*  Author: Clydie Braun, MD 07/15/2023 4:43 PM  For on call review [email or web address].

## 2023-07-15 NOTE — Progress Notes (Signed)
Patient awake, able to answer questions (name, address).  Patient is restless, trying to trow his legs over side rails.  Seizure pads applied for safety.  Will get order for tele sitter.  Pt requested we call his wife.  I called his wife (Vickie) and gave her an update.  Also shared that we applied seizure pads to the bed and are getting a Soil scientist for safety. She was relieved that he was able to answer questions this am.

## 2023-07-15 NOTE — Progress Notes (Signed)
Took off pressure dressing, nothing to sew or staple.  Clotted blood on forehead, no active bleeding. May need continued pressure dressing, may be able to work with EEG leeds in place.  Quentin Ore, MD General, Bariatric and Minimally Invasive Surgery [facility]

## 2023-07-15 NOTE — Progress Notes (Addendum)
Placed verbal order for telesitter per MD

## 2023-07-15 NOTE — Discharge Summary (Addendum)
Physician Discharge Summary   Patient: Chris [name] MRN: [id number] DOB: December 24, 1940  Admit date:     07/14/2023  Discharge date: 07/15/23  Discharge Physician: Pennie Banter   PCP: Hoover Browns, MD   Recommendations at discharge:    Follow ups at discharge per team at [facility] once inpatient evaluation is complete  Discharge Diagnoses: Principal Problem:   Seizure [facility])  Resolved Problems:   * No resolved hospital problems. [facility] Course: HPI on admission 07/14/23: " Chris [name] is a 82 y.o. 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   Patient woke up this morning around 1 AM, confused and started to have twitching like movement of left-sided and new onset right-sided weakness as well as staring and not responding.  Patient appeared to have trouble to communicate with family.  Family was concerned about stroke and called EMS.   At baseline, patient has a partial complex seizure for which she has been following with [facility] neurology since summer.  He was used to be on Keppra and due to side effect of drowsiness, antiseizure medication switched to Vimpat sometime in July, patient initially responded well however family reported that recently patient developed more staring episode and Vimpat dosage was increased couple weeks ago.   ED Course: Afebrile, nontachycardic blood pressure 160/88.  O2 saturation 100% on room air. Code stroke was called and patient was conservatively noncandidate for TNK treatment.  CT head negative for acute findings, CTA head and neck negative for large vessel occlusion.  Chest x-ray and UA pending.  Blood work WBC 12.0, hemoglobin 13, sodium 136, glucose 315, BUN 41, creatinine 1.4   Patient was  becoming agitated in the ED, and had a fall hit his head and left arm.  Trauma scan of CT head and neck and facial CT showed no acute fracture or dislocation.  Given droperidol in the ED  "  Patient was admitted and Neurology consulted -- see consult note.  Stroke was ruled out. EEG showed diffuse slowing with superimposed L focal slowing and overriding beta frequencies   Neurology recommended transfer to [facility] for Continuous EEG monitoring.  Patient has had waxing and waning confusion since admission, but he is medically stable for transport at this time.   Assessment and Plan:  Acute metabolic encephalopathy - post-ictal status versus status epilepticus -1 mg IV Ativan given had some effect on the twitching movement of the left side and patient became very sleepy.  Discussed with on-call neurology Dr. Selina Cooley, recommended loading patient with Vimpat 200 mg IV x 1 and continue home dose of 100 mg twice daily.  Contacted [facility] transplant center for transfer patient to a tertiary center for continuous EEG monitoring.   -MRI negative for acute findings -Routine EEG -- as above -Pt stable for transfer to [facility] -Per Neurology:  --- continue Vimpat 200 mg BID --- IV ativan 2 mg for seizure activity and load Keppra 20 mg/kg followed by 500 mg BID along with continuing Vimpat    Status epilepticus / Breakthrough seizure Mgmt as above   Stroke - ruled out.  MRI negative.     Chronic left foot ulcer -No symptoms or signs of active infection.  Left foot x-ray showed no osteomyelitis and  only chronic.  Changes.  Ffollowed by podiatry       Consultants: Neurology Procedures performed: EEG - as above  Disposition:  Transfer to [facility] Diet recommendation:  Discharge Diet Orders (From admission, onward)     Start     Ordered   07/15/23 0000  Diet - low sodium heart healthy        07/15/23 1402            DISCHARGE MEDICATION: Allergies as of 07/15/2023        Reactions   Lisinopril Other (See Comments)   HyperK   No Known Allergies         Medication List     STOP taking these medications    B-12 2500 MCG Tabs   isosorbide mononitrate 30 MG 24 hr tablet Commonly known as: IMDUR   multivitamin with minerals tablet   omega-3 acid ethyl esters 1 g capsule Commonly known as: LOVAZA   permethrin 5 % cream Commonly known as: ELIMITE       TAKE these medications    aspirin EC 81 MG tablet Take 81 mg by mouth daily.   atorvastatin 40 MG tablet Commonly known as: LIPITOR Take 40 mg by mouth daily at 6 PM.   cetirizine 10 MG tablet Commonly known as: ZYRTEC Take 10 mg by mouth daily.   co-enzyme Q-10 30 MG capsule Take 30 mg by mouth daily.   Farxiga 10 MG Tabs tablet Generic drug: dapagliflozin propanediol Take 10 mg by mouth daily.   furosemide 40 MG tablet Commonly known as: LASIX TAKE 1 TABLET(40 MG) BY MOUTH EVERY DAY   gabapentin 300 MG capsule Commonly known as: NEURONTIN TAKE 4 CAPSULES(1200 MG) BY MOUTH TWICE DAILY   insulin lispro 100 UNIT/ML KwikPen Commonly known as: HUMALOG Inject 0-15 Units into the skin 3 (three) times daily.   insulin lispro protamine-lispro (75-25) 100 UNIT/ML Susp injection Commonly known as: HUMALOG 75/25 MIX Inject 30 Units into the skin 2 (two) times daily.   lacosamide 200 MG Tabs tablet Commonly known as: VIMPAT Take 1 tablet (200 mg total) by mouth 2 (two) times daily. What changed:  medication strength how much to take when to take this   levothyroxine 88 MCG tablet Commonly known as: SYNTHROID TAKE 1 TABLET BY MOUTH DAILY AT 6 AM   lidocaine 5 % ointment Commonly known as: XYLOCAINE Apply 1 application topically as needed.   losartan 25 MG tablet Commonly known as: COZAAR Take 25 mg by mouth daily.   pentoxifylline 400 MG CR tablet Commonly known as: TRENTAL TAKE 1 TABLET(400 MG) BY MOUTH TWICE DAILY        Discharge Exam: Filed Weights    07/14/23 0333  Weight: 92.4 kg   General exam: awake, alert, no acute distress, confused HEENT: moist mucus membranes, hearing grossly normal  Respiratory system: CTAB, no wheezes, rales or rhonchi, normal respiratory effort. Cardiovascular system: normal S1/S2, RRR   Gastrointestinal system: soft, NT, ND Central nervous system:  no gross focal neurologic deficits, normal speech Extremities: moves all, no edema, normal tone Skin: dry, intact, normal temperature Psychiatry: normal mood, congruent affect, confused   Condition at discharge: stable  The results of significant diagnostics from this hospitalization (including imaging, microbiology, ancillary and laboratory) are listed below for reference.   Imaging Studies: MR BRAIN WO CONTRAST  Result Date: 07/15/2023 CLINICAL DATA:  Seizure with right-sided weakness and aphasia EXAM: MRI HEAD WITHOUT CONTRAST TECHNIQUE: Multiplanar, multiecho pulse sequences of the  brain and surrounding structures were obtained without intravenous contrast. COMPARISON:  11/19/2022 FINDINGS: Motion degraded examination. Brain: No acute infarct, mass effect or extra-axial collection. No acute or chronic hemorrhage. There is multifocal hyperintense T2-weighted signal within the white matter. Parenchymal volume and CSF spaces are normal. The midline structures are normal. Vascular: Normal flow voids. Skull and upper cervical spine: Right frontal scalp hematoma. Sinuses/Orbits:No paranasal sinus fluid levels or advanced mucosal thickening. No mastoid or middle ear effusion. Normal orbits. IMPRESSION: 1. Motion degraded examination. 2. No acute intracranial abnormality. 3. Right frontal scalp hematoma. 4. Findings of chronic small vessel ischemia. Electronically Signed   By: Deatra Robinson M.D.   On: 07/15/2023 01:48   EEG adult  Result Date: 07/14/2023 Jefferson Fuel, MD     07/14/2023  7:44 PM Routine EEG Report Chris [name] is a 82 y.o. male with a history of  seizures who is undergoing an EEG to evaluate for seizures. Report: This EEG was acquired with electrodes placed according to the International 10-20 electrode system (including Fp1, Fp2, F3, F4, C3, C4, P3, P4, O1, O2, T3, T4, T5, T6, A1, A2, Fz, Cz, Pz). The following electrodes were missing or displaced: none. The occipital dominant rhythm was 7 Hz with overriding beta frequencies. This activity is reactive to stimulation. Drowsiness was manifested by background fragmentation; deeper stages of sleep were identified by K complexes and sleep spindles. There was left focal slowing. There were no interictal epileptiform discharges. There were no electrographic seizures identified. Photic stimulation and hyperventilation were not performed. Impression and clinical correlation: This EEG was obtained while awake and asleep and is abnormal due to: - mild diffuse slowing indicative of global cerebral dysfunction - left focal slowing indicative of superimposed focal cerebral dysfunction in that area Epileptiform abnormalities were not seen during this recording. Bing Neighbors, MD [facility] [phone] If 7pm- 7am, please page neurology on call as listed in AMION.   DG Chest 1 View  Result Date: 07/14/2023 CLINICAL DATA:  Seizure. EXAM: CHEST  1 VIEW COMPARISON:  07/28/2019. FINDINGS: Bilateral lung fields are clear. Bilateral costophrenic angles are clear. Normal cardio-mediastinal silhouette. There are surgical staples along the heart border and sternotomy wires, status post CABG (coronary artery bypass graft). No acute osseous abnormalities. The soft tissues are within normal limits. IMPRESSION: *No active disease. Electronically Signed   By: Jules Schick M.D.   On: 07/14/2023 14:31   CT Head Wo Contrast  Result Date: 07/14/2023 CLINICAL DATA:  Neck trauma.  Fall with head trauma. EXAM: CT HEAD WITHOUT CONTRAST CT MAXILLOFACIAL WITHOUT CONTRAST CT CERVICAL SPINE WITHOUT CONTRAST TECHNIQUE:  Multidetector CT imaging of the head, cervical spine, and maxillofacial structures were performed using the standard protocol without intravenous contrast. Multiplanar CT image reconstructions of the cervical spine and maxillofacial structures were also generated. RADIATION DOSE REDUCTION: This exam was performed according to the departmental dose-optimization program which includes automated exposure control, adjustment of the mA and/or kV according to patient size and/or use of iterative reconstruction technique. COMPARISON:  None Available. FINDINGS: CT HEAD FINDINGS Brain: No evidence of acute infarction, hemorrhage, hydrocephalus, extra-axial collection or mass lesion/mass effect. Vascular: No hyperdense vessel or unexpected calcification. Skull: Normal. Negative for fracture or focal lesion. CT MAXILLOFACIAL FINDINGS Osseous: No fracture or mandibular dislocation. No destructive process. Orbits: Negative. No traumatic or inflammatory finding. Sinuses: Clear Soft tissues: Negative CT CERVICAL SPINE FINDINGS Alignment: Normal Skull base and vertebrae: No acute fracture. No primary bone lesion or focal pathologic process. C4-C7  ACDF. Bridging spondylitic spurs at C2-3 and C7-T1. Soft tissues and spinal canal: No prevertebral fluid or swelling. No visible canal hematoma. Disc levels: C3-4 bilateral foraminal narrowing from disc height loss and ridging. No bony spinal canal impingement detected. Upper chest: No visible injury IMPRESSION: No evidence of intracranial or cervical spine injury. Negative for facial fracture. Electronically Signed   By: Tiburcio Pea M.D.   On: 07/14/2023 06:43   CT Maxillofacial Wo Contrast  Result Date: 07/14/2023 CLINICAL DATA:  Neck trauma.  Fall with head trauma. EXAM: CT HEAD WITHOUT CONTRAST CT MAXILLOFACIAL WITHOUT CONTRAST CT CERVICAL SPINE WITHOUT CONTRAST TECHNIQUE: Multidetector CT imaging of the head, cervical spine, and maxillofacial structures were performed using  the standard protocol without intravenous contrast. Multiplanar CT image reconstructions of the cervical spine and maxillofacial structures were also generated. RADIATION DOSE REDUCTION: This exam was performed according to the departmental dose-optimization program which includes automated exposure control, adjustment of the mA and/or kV according to patient size and/or use of iterative reconstruction technique. COMPARISON:  None Available. FINDINGS: CT HEAD FINDINGS Brain: No evidence of acute infarction, hemorrhage, hydrocephalus, extra-axial collection or mass lesion/mass effect. Vascular: No hyperdense vessel or unexpected calcification. Skull: Normal. Negative for fracture or focal lesion. CT MAXILLOFACIAL FINDINGS Osseous: No fracture or mandibular dislocation. No destructive process. Orbits: Negative. No traumatic or inflammatory finding. Sinuses: Clear Soft tissues: Negative CT CERVICAL SPINE FINDINGS Alignment: Normal Skull base and vertebrae: No acute fracture. No primary bone lesion or focal pathologic process. C4-C7 ACDF. Bridging spondylitic spurs at C2-3 and C7-T1. Soft tissues and spinal canal: No prevertebral fluid or swelling. No visible canal hematoma. Disc levels: C3-4 bilateral foraminal narrowing from disc height loss and ridging. No bony spinal canal impingement detected. Upper chest: No visible injury IMPRESSION: No evidence of intracranial or cervical spine injury. Negative for facial fracture. Electronically Signed   By: Tiburcio Pea M.D.   On: 07/14/2023 06:43   CT Cervical Spine Wo Contrast  Result Date: 07/14/2023 CLINICAL DATA:  Neck trauma.  Fall with head trauma. EXAM: CT HEAD WITHOUT CONTRAST CT MAXILLOFACIAL WITHOUT CONTRAST CT CERVICAL SPINE WITHOUT CONTRAST TECHNIQUE: Multidetector CT imaging of the head, cervical spine, and maxillofacial structures were performed using the standard protocol without intravenous contrast. Multiplanar CT image reconstructions of the cervical  spine and maxillofacial structures were also generated. RADIATION DOSE REDUCTION: This exam was performed according to the departmental dose-optimization program which includes automated exposure control, adjustment of the mA and/or kV according to patient size and/or use of iterative reconstruction technique. COMPARISON:  None Available. FINDINGS: CT HEAD FINDINGS Brain: No evidence of acute infarction, hemorrhage, hydrocephalus, extra-axial collection or mass lesion/mass effect. Vascular: No hyperdense vessel or unexpected calcification. Skull: Normal. Negative for fracture or focal lesion. CT MAXILLOFACIAL FINDINGS Osseous: No fracture or mandibular dislocation. No destructive process. Orbits: Negative. No traumatic or inflammatory finding. Sinuses: Clear Soft tissues: Negative CT CERVICAL SPINE FINDINGS Alignment: Normal Skull base and vertebrae: No acute fracture. No primary bone lesion or focal pathologic process. C4-C7 ACDF. Bridging spondylitic spurs at C2-3 and C7-T1. Soft tissues and spinal canal: No prevertebral fluid or swelling. No visible canal hematoma. Disc levels: C3-4 bilateral foraminal narrowing from disc height loss and ridging. No bony spinal canal impingement detected. Upper chest: No visible injury IMPRESSION: No evidence of intracranial or cervical spine injury. Negative for facial fracture. Electronically Signed   By: Tiburcio Pea M.D.   On: 07/14/2023 06:43   DG Foot 2 Views Left  Result  Date: 07/14/2023 CLINICAL DATA:  Pain. Wound on left foot. Evaluate for osteomyelitis. 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 There is been surgical resection of the distal aspect of the fourth metatarsal bone and fifth metatarsal bone. There are no signs of underlying acute fracture or dislocation. No focal bone erosions identified to indicate osteomyelitis. Vascular calcifications noted. Calcaneal heel  spurs. IMPRESSION: 1. No acute findings. 2. 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 3. Surgical resection of the distal aspect of the fourth and fifth metatarsal bones. Electronically Signed   By: Signa Kell M.D.   On: 07/14/2023 05:46   CT ANGIO HEAD NECK W WO CM W PERF (CODE STROKE)  Result Date: 07/14/2023 CLINICAL DATA:  Slurred speech EXAM: CT ANGIOGRAPHY HEAD AND NECK TECHNIQUE: Multidetector CT imaging of the head and neck was performed using the standard protocol during bolus administration of intravenous contrast. Multiplanar CT image reconstructions and MIPs were obtained to evaluate the vascular anatomy. Carotid stenosis measurements (when applicable) are obtained utilizing NASCET criteria, using the distal internal carotid diameter as the denominator. RADIATION DOSE REDUCTION: This exam was performed according to the departmental dose-optimization program which includes automated exposure control, adjustment of the mA and/or kV according to patient size and/or use of iterative reconstruction technique. CONTRAST:  OMNIPAQUE IOHEXOL 350 MG/ML SOLN COMPARISON:  Head CT from earlier today FINDINGS: CTA NECK FINDINGS Aortic arch: Atheromatous plaque.  Three vessel branching. Right carotid system: Mixed density plaque mainly at the bifurcation without flow reducing stenosis or ulceration. Left carotid system: Mixed density plaque mainly at the bifurcation without flow reducing stenosis or ulceration. Vertebral arteries: No proximal subclavian stenosis. The right vertebral artery is dominant. No vertebral flow reducing stenosis, beading, or dissection. Skeleton: Generalized cervical solid arthrodesis and spondylitic ankylosis. Other neck: No acute finding Upper chest: No acute finding Review of the MIP images confirms the above findings CTA HEAD FINDINGS Anterior circulation: Extensive atheromatous plaque on the carotid siphons. No branch occlusion, beading, or  aneurysm. Posterior circulation: Right dominant vertebral artery. Mild atheromatous plaque to the basilar. No branch occlusion, beading, aneurysm, or flow reducing stenosis. Venous sinuses: Unremarkable for contrast timing Anatomic variants: As above Review of the MIP images confirms the above findings IMPRESSION: 1. No emergent finding. 2. Atherosclerosis without flow reducing stenosis or ulceration of major arteries in the head and neck. Electronically Signed   By: Tiburcio Pea M.D.   On: 07/14/2023 04:08   CT HEAD CODE STROKE WO CONTRAST  Result Date: 07/14/2023 CLINICAL DATA:  Code stroke.  Slurred speech, right-sided weakness EXAM: CT HEAD WITHOUT CONTRAST TECHNIQUE: Contiguous axial images were obtained from the base of the skull through the vertex without intravenous contrast. RADIATION DOSE REDUCTION: This exam was performed according to the departmental dose-optimization program which includes automated exposure control, adjustment of the mA and/or kV according to patient size and/or use of iterative reconstruction technique. COMPARISON:  11/18/2022 FINDINGS: Brain: No evidence of acute infarction, hemorrhage, mass, mass effect, or midline shift. No hydrocephalus or extra-axial collection. Vascular: No hyperdense vessel. Atherosclerotic calcifications in the intracranial carotid and vertebral arteries. Skull: Negative for fracture or focal lesion. Sinuses/Orbits: No acute finding. Other: The mastoid air cells are well aerated. ASPECTS [facility] Stroke Program Early CT Score) - Ganglionic level infarction (caudate, lentiform nuclei, internal capsule, insula, M1-M3 cortex): 7 - Supraganglionic infarction (M4-M6 cortex): 3 Total score (  0-10 with 10 being normal): 10 IMPRESSION: No evidence of acute intracranial process. ASPECTS is 10. Code stroke imaging results were communicated on 07/14/2023 at 3:25 am to provider [facility] via telephone, who verbally acknowledged these results. Electronically Signed    By: Wiliam Ke M.D.   On: 07/14/2023 03:26    Microbiology: Results for orders placed or performed during the hospital encounter of 10/18/19  SARS CORONAVIRUS 2 (TAT 6-24 HRS) Nasopharyngeal Nasopharyngeal Swab     Status: None   Collection Time: 10/18/19 12:41 PM   Specimen: Nasopharyngeal Swab  Result Value Ref Range Status   SARS Coronavirus 2 NEGATIVE NEGATIVE Final    Comment: (NOTE) SARS-CoV-2 target nucleic acids are NOT DETECTED. The SARS-CoV-2 RNA is generally detectable in upper and lower respiratory specimens during the acute phase of infection. Negative results do not preclude SARS-CoV-2 infection, do not rule out co-infections with other pathogens, and should not be used as the sole basis for treatment or other patient management decisions. Negative results must be combined with clinical observations, patient history, and epidemiological information. The expected result is Negative. Fact Sheet for Patients: HairSlick.no Fact Sheet for Healthcare Providers: quierodirigir.com This test is not yet approved or cleared by the [place] FDA and  has been authorized for detection and/or diagnosis of SARS-CoV-2 by FDA under an Emergency Use Authorization (EUA). This EUA will remain  in effect (meaning this test can be used) for the duration of the COVID-19 declaration under Section 56 4(b)(1) of the Act, 21 U.S.C. section 360bbb-3(b)(1), unless the authorization is terminated or revoked sooner. Performed at [facility], [address], [place], [place] [address]     Labs: CBC: Recent Labs  Lab 07/14/23 0320 07/15/23 0607  WBC 12.0* 12.5*  NEUTROABS 9.4*  --   HGB 13.2 11.8*  HCT 40.3 34.4*  MCV 94.4 91.5  PLT 180 136*   Basic Metabolic Panel: Recent Labs  Lab 07/14/23 0320 07/15/23 0607  NA 136 142  K 4.2 4.1  CL 97* 105  CO2 24 26  GLUCOSE 315* 250*  BUN 41* 50*  CREATININE 1.43* 1.28*   CALCIUM 8.9 8.7*   Liver Function Tests: Recent Labs  Lab 07/14/23 0320  AST 23  ALT 21  ALKPHOS 109  BILITOT 0.8  PROT 7.6  ALBUMIN 4.2   CBG: Recent Labs  Lab 07/14/23 1331 07/14/23 1758 07/14/23 2221 07/15/23 1009 07/15/23 1254  GLUCAP 301* 241* 219* 200* 278*    Discharge time spent: greater than 30 minutes.  Signed: Pennie Banter, DO [facility] 07/15/2023

## 2023-07-15 NOTE — Inpatient Diabetes Management (Addendum)
Inpatient Diabetes Program Recommendations  AACE/ADA: New Consensus Statement on Inpatient Glycemic Control (2015)  Target Ranges:  Prepandial:   less than 140 mg/dL      Peak postprandial:   less than 180 mg/dL (1-2 hours)      Critically ill patients:  140 - 180 mg/dL   Lab Results  Component Value Date   GLUCAP 200 (H) 07/15/2023   HGBA1C 6.8 (H) 07/14/2023    Review of Glycemic Control  Latest Reference Range & Units 07/14/23 03:15 07/14/23 13:31 07/14/23 17:58 07/14/23 22:21 07/15/23 10:09  Glucose-Capillary 70 - 99 mg/dL 696 (H) 295 (H) 284 (H) 219 (H) 200 (H)  (H): Data is abnormally high  Latest Reference Range & Units 07/15/23 06:07  Glucose 70 - 99 mg/dL 132 (H)  (H): Data is abnormally high  Diabetes history: DM2 Outpatient Diabetes medications: Farxiga 10 every day, Humalog 0-15 TID, 70/30 30 units BID Current orders for Inpatient glycemic control: Semglee 20 units every day, Novolog 0-15 units TID and 0-5 units QHS    Inpatient Diabetes Program Recommendations:    Remains NPO.  Please consider increasing basal insulin :  Semglee 25 units every day  Will continue to follow while inpatient.  Thank you, Dulce Sellar, MSN, CDCES Diabetes Coordinator Inpatient Diabetes Program [phone] (team pager from 8a-5p)

## 2023-07-15 NOTE — Plan of Care (Signed)
  Problem: Education: Goal: Knowledge of General Education information will improve Description Including pain rating scale, medication(s)/side effects and non-pharmacologic comfort measures Outcome: Progressing   

## 2023-07-16 ENCOUNTER — Inpatient Hospital Stay (HOSPITAL_COMMUNITY): Payer: Medicare Other

## 2023-07-16 DIAGNOSIS — R569 Unspecified convulsions: Secondary | ICD-10-CM | POA: Diagnosis not present

## 2023-07-16 LAB — BASIC METABOLIC PANEL
Anion gap: 9 (ref 5–15)
BUN: 36 mg/dL — ABNORMAL HIGH (ref 8–23)
CO2: 25 mmol/L (ref 22–32)
Calcium: 8.6 mg/dL — ABNORMAL LOW (ref 8.9–10.3)
Chloride: 111 mmol/L (ref 98–111)
Creatinine, Ser: 1.08 mg/dL (ref 0.61–1.24)
GFR, Estimated: >60 mL/min (ref >60)
Glucose, Bld: 170 mg/dL — ABNORMAL HIGH (ref 70–99)
Potassium: 4.2 mmol/L (ref 3.5–5.1)
Sodium: 145 mmol/L (ref 135–145)

## 2023-07-16 LAB — CBC
HCT: 31.5 % — ABNORMAL LOW (ref 39.0–52.0)
Hemoglobin: 10.3 g/dL — ABNORMAL LOW (ref 13.0–17.0)
MCH: 31.3 pg (ref 26.0–34.0)
MCHC: 32.7 g/dL (ref 30.0–36.0)
MCV: 95.7 fL (ref 80.0–100.0)
Platelets: 104 10*3/uL — ABNORMAL LOW (ref 150–400)
RBC: 3.29 MIL/uL — ABNORMAL LOW (ref 4.22–5.81)
RDW: 13.1 % (ref 11.5–15.5)
WBC: 8.7 10*3/uL (ref 4.0–10.5)
nRBC: 0.2 % (ref 0.0–0.2)

## 2023-07-16 LAB — GLUCOSE, CAPILLARY
Glucose-Capillary: 233 mg/dL — ABNORMAL HIGH (ref 70–99)
Glucose-Capillary: 301 mg/dL — ABNORMAL HIGH (ref 70–99)
Glucose-Capillary: 114 mg/dL — ABNORMAL HIGH (ref 70–99)
Glucose-Capillary: 103 mg/dL — ABNORMAL HIGH (ref 70–99)

## 2023-07-16 MED ORDER — CYCLOBENZAPRINE HCL 10 MG PO TABS
10.0000 mg | ORAL_TABLET | Freq: Three times a day (TID) | ORAL | Status: DC | PRN
  Administered 2023-07-16 – 2023-07-19 (×7): 10 mg via ORAL
  Filled 2023-07-16 (×7): qty 1

## 2023-07-16 NOTE — Plan of Care (Signed)
  Problem: Education: Goal: Knowledge of General Education information will improve Description Including pain rating scale, medication(s)/side effects and non-pharmacologic comfort measures Outcome: Progressing   

## 2023-07-16 NOTE — Progress Notes (Signed)
PROGRESS NOTE    Chris [name]  WUJ:[id number] DOB: Jan 07, 1941 DOA: 07/15/2023 PCP: Hoover Browns, MD   Brief Narrative:  This 82 yrs old male with PMH significant for hypertension, hyperlipidemia, CAD s/p four-vessel CABG, TIA, IDDM, osteomyelitis s/p right second toe amputation, and partial complex seizures who presents after being noted to be acutely altered.  Patient woke up yesterday morning around 1 AM with confusion and has twitching like movements of the left side, new right-sided weakness, staring spells and difficulty communicating.  EMS was called and patient was brought as stroke code. Patient follows up with [facility] neurology since summer for the history of seizures.  He had been on Keppra but due to significant drowsiness he was switched to Vimpat recently.  CT head negative for acute stroke.  CTA head and neck negative for LVO.  Patient is not considered to be a thrombolytic candidate.  Patient became agitated while in the ED and fell from the bed hitting his head and left arm.  Trauma scans were negative for any fracture or dislocation.  EEG showed diffuse slowing but no evidence of seizures.  Neurology had recommended continuous EEG due to concern for status epilepticus.  Patient was loaded with Vimpat and was given Ativan.  Long-term EEG is in progress.  Assessment & Plan:   Principal Problem:   Seizure (HCC) Active Problems:   Acute metabolic encephalopathy   Fall   Controlled type 2 diabetes mellitus without complication, without long-term current use of insulin (HCC)   AKI (acute kidney injury) (HCC)   Chronic ulcer of left foot (HCC)   Essential hypertension   HLD (hyperlipidemia)   Hypothyroidism (acquired)   Acute metabolic encephalopathy: Suspected status epilepticus: Patient presented with confusion,  twitching like movements of left side, new right-sided weakness, staring spells and difficulty communicating. Initial concern was for possible stroke.  CT and subsequent CTA H/N did not note any acute abnormality or large vessel occlusion.   Patient was not a thrombolytics candidate.  Subsequent MRI of the brain was negative.   EEG showed diffuse slowing with superimposed L focal slowing and overriding beta frequencies. He had been given Ativan IV and subsequently loaded with Vimpat 200 mg IV. Continue Seizure precautions Frequent Neuro checks Continuous overnight EEG monitoring Continue Vimpat, Continue Keppra 1gm twice daily. Appreciate neurology consultative services,    Gait disturbance: Fall with Scalp laceration: Patient was noted to be unsteady on his feet and had fallen while in the ED prior to transfer hitting his head.   CT scan head, maxillofacial, and cervical spine did not note any acute fracture.   After transfer patient noted to have significant bleeding from the scalp laceration which had been previously managed with Steri-Strips. PT/OT to eval and treat General Surgery consulted to repair scalp laceration.  No plans for any scalp laceration repair.  Continue pressure dressing.   Type 2 diabetes mellitus with hyperglycemia: HbA1c noted to be 6.8 on 10/21.   Patient was discharged on Farxiga 10 mg 75/25 insulin 30 units twice daily, and lispro insulin 0 to 15 units 3 times daily with meals. Continue Hypoglycemic protocols Continue Farxiga CBGs before every meal with sensitive SSI   Acute kidney injury: >Resolved. Likely prerenal.  Serum creatinine improved with IV hydration. Renal functions back to normal.   Chronic left foot ulcer: Peripheral vascular disease: No signs for infection appreciated on exam.   X-rays did not note any signs for osteomyelitis. Continue aspirin and pentoxifylline   Essential hypertension  Continue losartan 25 mg dialy.   Hypothyroidism  Continue levothyroxine   Hyperlipidemia Continue atorvastatin   DVT prophylaxis: SCDs Code Status:  Full code Family Communication: No family at  bed side. Disposition Plan:     Status is: Inpatient Remains inpatient appropriate because: Admitted for  CVA , workup, EEG is in progress.   Consultants:  Neurology  Procedures:CT H, MRI, EEG  Antimicrobials:  Anti-infectives (From admission, onward)    None      Subjective: Patient was seen and examined at bedside.  Overnight events noted.   Patient reports doing better.He has visible laceration on the front of his scalp,  pressure dressing is noted.  Dried blood noted.  Objective: Vitals:   07/15/23 2037 07/16/23 0000 07/16/23 0347 07/16/23 0753  BP: (!) 151/63 (!) 129/59 (!) 161/60   Pulse: 75 74 77   Resp: 18 16 18    Temp: 98.4 F (36.9 C) 98.6 F (37 C) 97.8 F (36.6 C)   TempSrc: Oral Oral Oral   SpO2: 99% 99% 97%   Weight:    92.4 kg  Height:    6\' 2"  (1.88 m)    Intake/Output Summary (Last 24 hours) at 07/16/2023 1159 Last data filed at 07/16/2023 0300 Gross per 24 hour  Intake 941.9 ml  Output 700 ml  Net 241.9 ml   Filed Weights   07/16/23 0753  Weight: 92.4 kg    Examination:  General exam: Appears calm and comfortable, not in any acute distress.  Dried blood noted.  Laceration noted on the front of head. Respiratory system: Clear to auscultation. Respiratory effort normal.  RR 16 Cardiovascular system: S1 & S2 heard, RRR. No JVD, murmurs, rubs, gallops or clicks. No pedal edema. Gastrointestinal system: Abdomen is nondistended, soft and non tender.Normal bowel sounds heard. Central nervous system: Alert and oriented x 3. No focal neurological deficits. Extremities: No edema, no cyanosis, no clubbing Skin: No rashes, lesions or ulcers Psychiatry: Judgement and insight appear normal. Mood & affect appropriate.     Data Reviewed: I have personally reviewed following labs and imaging studies  CBC: Recent Labs  Lab 07/14/23 0320 07/15/23 0607 07/16/23 0436  WBC 12.0* 12.5* 8.7  NEUTROABS 9.4*  --   --   HGB 13.2 11.8* 10.3*  HCT 40.3  34.4* 31.5*  MCV 94.4 91.5 95.7  PLT 180 136* 104*   Basic Metabolic Panel: Recent Labs  Lab 07/14/23 0320 07/15/23 0607 07/16/23 0436  NA 136 142 145  K 4.2 4.1 4.2  CL 97* 105 111  CO2 24 26 25   GLUCOSE 315* 250* 170*  BUN 41* 50* 36*  CREATININE 1.43* 1.28* 1.08  CALCIUM 8.9 8.7* 8.6*   GFR: Estimated Creatinine Clearance: 62.4 mL/min (by C-G formula based on SCr of 1.08 mg/dL). Liver Function Tests: Recent Labs  Lab 07/14/23 0320  AST 23  ALT 21  ALKPHOS 109  BILITOT 0.8  PROT 7.6  ALBUMIN 4.2   No results for input(s): "LIPASE", "AMYLASE" in the last 168 hours. No results for input(s): "AMMONIA" in the last 168 hours. Coagulation Profile: Recent Labs  Lab 07/14/23 0320  INR 1.1   Cardiac Enzymes: No results for input(s): "CKTOTAL", "CKMB", "CKMBINDEX", "TROPONINI" in the last 168 hours. BNP (last 3 results) No results for input(s): "PROBNP" in the last 8760 hours. HbA1C: Recent Labs    07/14/23 0320  HGBA1C 6.8*   CBG: Recent Labs  Lab 07/15/23 1254 07/15/23 1621 07/15/23 2124 07/16/23 0626 07/16/23 1139  GLUCAP  278* 250* 380* 233* 301*   Lipid Profile: No results for input(s): "CHOL", "HDL", "LDLCALC", "TRIG", "CHOLHDL", "LDLDIRECT" in the last 72 hours. Thyroid Function Tests: Recent Labs    07/15/23 2009  TSH 0.294*   Anemia Panel: No results for input(s): "VITAMINB12", "FOLATE", "FERRITIN", "TIBC", "IRON", "RETICCTPCT" in the last 72 hours. Sepsis Labs: No results for input(s): "PROCALCITON", "LATICACIDVEN" in the last 168 hours.  No results found for this or any previous visit (from the past 240 hour(s)).   Radiology Studies: MR BRAIN WO CONTRAST  Result Date: 07/15/2023 CLINICAL DATA:  Seizure with right-sided weakness and aphasia EXAM: MRI HEAD WITHOUT CONTRAST TECHNIQUE: Multiplanar, multiecho pulse sequences of the brain and surrounding structures were obtained without intravenous contrast. COMPARISON:  11/19/2022 FINDINGS:  Motion degraded examination. Brain: No acute infarct, mass effect or extra-axial collection. No acute or chronic hemorrhage. There is multifocal hyperintense T2-weighted signal within the white matter. Parenchymal volume and CSF spaces are normal. The midline structures are normal. Vascular: Normal flow voids. Skull and upper cervical spine: Right frontal scalp hematoma. Sinuses/Orbits:No paranasal sinus fluid levels or advanced mucosal thickening. No mastoid or middle ear effusion. Normal orbits. IMPRESSION: 1. Motion degraded examination. 2. No acute intracranial abnormality. 3. Right frontal scalp hematoma. 4. Findings of chronic small vessel ischemia. Electronically Signed   By: Deatra Robinson M.D.   On: 07/15/2023 01:48   EEG adult  Result Date: 07/14/2023 Jefferson Fuel, MD     07/14/2023  7:44 PM Routine EEG Report Rodrique Constable is a 82 y.o. male with a history of seizures who is undergoing an EEG to evaluate for seizures. Report: This EEG was acquired with electrodes placed according to the International 10-20 electrode system (including Fp1, Fp2, F3, F4, C3, C4, P3, P4, O1, O2, T3, T4, T5, T6, A1, A2, Fz, Cz, Pz). The following electrodes were missing or displaced: none. The occipital dominant rhythm was 7 Hz with overriding beta frequencies. This activity is reactive to stimulation. Drowsiness was manifested by background fragmentation; deeper stages of sleep were identified by K complexes and sleep spindles. There was left focal slowing. There were no interictal epileptiform discharges. There were no electrographic seizures identified. Photic stimulation and hyperventilation were not performed. Impression and clinical correlation: This EEG was obtained while awake and asleep and is abnormal due to: - mild diffuse slowing indicative of global cerebral dysfunction - left focal slowing indicative of superimposed focal cerebral dysfunction in that area Epileptiform abnormalities were not seen during this  recording. Bing Neighbors, MD [facility] [phone] If 7pm- 7am, please page neurology on call as listed in AMION.   DG Chest 1 View  Result Date: 07/14/2023 CLINICAL DATA:  Seizure. EXAM: CHEST  1 VIEW COMPARISON:  07/28/2019. FINDINGS: Bilateral lung fields are clear. Bilateral costophrenic angles are clear. Normal cardio-mediastinal silhouette. There are surgical staples along the heart border and sternotomy wires, status post CABG (coronary artery bypass graft). No acute osseous abnormalities. The soft tissues are within normal limits. IMPRESSION: *No active disease. Electronically Signed   By: Jules Schick M.D.   On: 07/14/2023 14:31    Scheduled Meds:  aspirin EC  81 mg Oral Daily   atorvastatin  80 mg Oral Daily   dapagliflozin propanediol  10 mg Oral Daily   influenza vaccine adjuvanted  0.5 mL Intramuscular Tomorrow-1000   insulin aspart  0-5 Units Subcutaneous QHS   insulin aspart  0-9 Units Subcutaneous TID WC   insulin aspart protamine- aspart  30 Units Subcutaneous  BID WC   lacosamide  200 mg Oral BID   levothyroxine  88 mcg Oral Q0600   losartan  25 mg Oral Daily   pentoxifylline  400 mg Oral BID   sodium chloride flush  3 mL Intravenous Q12H   Continuous Infusions:  sodium chloride 100 mL/hr at 07/16/23 0500   levETIRAcetam 1,000 mg (07/16/23 0837)     LOS: 1 day    Time spent: 50 mins    Willeen Niece, MD [facility]   If 7PM-7AM, please contact night-coverage

## 2023-07-16 NOTE — Progress Notes (Signed)
LTM EEG hooked up and running - no initial skin breakdown - push button tested - [facility] monitoring.  

## 2023-07-16 NOTE — Progress Notes (Signed)
NEUROLOGY CONSULT FOLLOW UP NOTE   Date of service: July 16, 2023 Patient Name: My Sidel MRN:  [id number] DOB:  Aug 16, 1941  Brief HPI  Jiovonni Goggins is a 82 y.o. male who presented with  multiple recurrent seizures with continued waxing/waning transferred for LTM EEG.  Keppra was added on 10/22 secondary to an episode in the morning while still at [facility] concerning for seizure.   Interval Hx/subjective   No further seizures that the patient is aware of, but he has not been aware of his previous seizures either.  Vitals   Vitals:   07/15/23 2037 07/16/23 0000 07/16/23 0347 07/16/23 0753  BP: (!) 151/63 (!) 129/59 (!) 161/60   Pulse: 75 74 77   Resp: 18 16 18    Temp: 98.4 F (36.9 C) 98.6 F (37 C) 97.8 F (36.6 C)   TempSrc: Oral Oral Oral   SpO2: 99% 99% 97%   Weight:    92.4 kg  Height:    6\' 2"  (1.88 m)     Body mass index is 26.15 kg/m.  Physical Exam   General: In bed, bandage in place on the forehead  Neurologic Examination   MS: Awake, alert knows where he is, gives the correct month and year CN: Visual fields are full, EOMI Motor: Full strength throughout Sensory: Intact to light touch  Labs and Diagnostic Imaging   CBC:  Recent Labs  Lab 07/14/23 0320 07/15/23 0607 07/16/23 0436  WBC 12.0* 12.5* 8.7  NEUTROABS 9.4*  --   --   HGB 13.2 11.8* 10.3*  HCT 40.3 34.4* 31.5*  MCV 94.4 91.5 95.7  PLT 180 136* 104*    Basic Metabolic Panel:  Lab Results  Component Value Date   NA 145 07/16/2023   K 4.2 07/16/2023   CO2 25 07/16/2023   GLUCOSE 170 (H) 07/16/2023   BUN 36 (H) 07/16/2023   CREATININE 1.08 07/16/2023   CALCIUM 8.6 (L) 07/16/2023   GFRNONAA >60 07/16/2023   GFRAA 39 (L) 07/28/2019   Lipid Panel: No results found for: "LDLCALC" HgbA1c:  Lab Results  Component Value Date   HGBA1C 6.8 (H) 07/14/2023   Urine Drug Screen:     Component Value Date/Time   LABOPIA NONE DETECTED 07/14/2023 1419   COCAINSCRNUR  NONE DETECTED 07/14/2023 1419   LABBENZ NONE DETECTED 07/14/2023 1419   AMPHETMU NONE DETECTED 07/14/2023 1419   THCU NONE DETECTED 07/14/2023 1419   LABBARB NONE DETECTED 07/14/2023 1419    Alcohol Level     Component Value Date/Time   ETH <10 07/14/2023 0336   INR  Lab Results  Component Value Date   INR 1.1 07/14/2023   APTT  Lab Results  Component Value Date   APTT 29 07/14/2023    Impression   Sahith Whitehall is a 82 y.o. male who presented with recurrent seizures and has had persistent waxing/waning mental status with concern for intermittent focal seizures.  He is currently being managed on Vimpat 200 twice daily.  He had a least one episode concerning for seizure this morning despite Vimpat, and I would favor adding a second agent.  Recommendations  LTM EEG is being connected now Continue Vimpat 200 twice daily Keppra 1 g twice daily Neurology will continue to follow ______________________________________________________________________   Thank you for the opportunity to take part in the care of this patient. If you have any further questions, please contact the neurology consultation team on call. Updated oncall schedule is listed on AMION.  Signed,  Ritta Slot, MD [facility] [phone]  If 7pm- 7am, please page neurology on call as listed in AMION.

## 2023-07-16 NOTE — TOC Initial Note (Signed)
Transition of Care [facility]) - Initial/Assessment Note    Patient Details  Name: Chris [name] MRN: [id number] Date of Birth: 04/22/1941  [facility] CM/SW Contact:    Kermit Balo, RN Phone Number: 07/16/2023, 2:34 PM  Clinical Narrative:                  Patient is from home with his spouse. They are together all the time.  Spouse provides needed transportation. Spouse and pt manage his medications and they deny any issues.  Awaiting therapy evals.  TOC following.  Expected Discharge Plan:  (to be determined) Barriers to Discharge: Continued Medical Work up   Patient Goals and CMS Choice            Expected Discharge Plan and Services   Discharge Planning Services: CM Consult   Living arrangements for the past 2 months: Single Family Home                                      Prior Living Arrangements/Services Living arrangements for the past 2 months: Single Family Home Lives with:: Spouse Patient language and need for interpreter reviewed:: Yes Do you feel safe going back to the place where you live?: Yes        Care giver support system in place?: Yes (comment) Current home services: DME (cane/ walker) Criminal Activity/Legal Involvement Pertinent to Current Situation/Hospitalization: No - Comment as needed  Activities of Daily Living   ADL Screening (condition at time of admission) Independently performs ADLs?: Yes (appropriate for developmental age) Is the patient deaf or have difficulty hearing?: No Does the patient have difficulty seeing, even when wearing glasses/contacts?: No Does the patient have difficulty concentrating, remembering, or making decisions?: No  Permission Sought/Granted                  Emotional Assessment Appearance:: Appears stated age Attitude/Demeanor/Rapport: Engaged Affect (typically observed): Accepting Orientation: : Oriented to Self, Oriented to Place, Oriented to Situation   Psych  Involvement: No (comment)  Admission diagnosis:  Seizure [facility]) [R56.9] Patient Active Problem List   Diagnosis Date Noted   Acute metabolic encephalopathy 07/15/2023   Fall 07/15/2023   AKI (acute kidney injury) (HCC) 07/15/2023   Chronic ulcer of left foot (HCC) 07/15/2023   Seizure (HCC) 07/14/2023   Syncope 11/18/2022   Bradycardia 11/18/2022   Hypoglycemia 11/18/2022   CVA (cerebral vascular accident) (HCC) 07/18/2019   Ulcer of ankle (HCC) 04/26/2019   Pain due to onychomycosis of toenails of both feet 03/15/2019   Dysphagia 10/29/2016   Impaired functional mobility, balance, gait, and endurance 10/29/2016   Memory impairment 10/29/2016   Right hip pain 10/29/2016   Tremors of nervous system 10/29/2016   Carpal tunnel syndrome 08/07/2016   Coronary artery disease involving coronary bypass graft of native heart without angina pectoris 07/18/2016   Essential hypertension 07/18/2016   HLD (hyperlipidemia) 07/18/2016   Hypothyroidism (acquired) 07/18/2016   Paresthesias 07/18/2016   Risk for falls 07/18/2016   Type 2 diabetes mellitus with diabetic neuropathy, without long-term current use of insulin (HCC) 07/18/2016   Controlled type 2 diabetes mellitus without complication, without long-term current use of insulin (HCC) 07/18/2016   PCP:  Hoover Browns, MD Pharmacy:   [id number] - [place], [place] - [address] AT [address] OF [address]  [place] [address] Phone: [phone] Fax: [phone]  [id number] - [place], [place] - [address] AT [facility] [address] [facility] [address] [place] [address] Phone: [phone] Fax: [phone]  [facility] [place] - [address] [place] [address] Phone: [phone] Fax: [phone]     Social Determinants of Health (SDOH) Social History: SDOH Screenings   Food Insecurity: No  Food Insecurity (07/15/2023)  Housing: Patient Declined (07/15/2023)  Transportation Needs: No Transportation Needs (07/15/2023)  Utilities: Not At Risk (07/15/2023)  Financial Resource Strain: Low Risk  (08/23/2022)   Received from [facility], [facility]  Physical Activity: Insufficiently Active (08/23/2022)   Received from [facility], [facility]  Social Connections: Moderately Integrated (08/23/2022)   Received from [facility], [facility]  Stress: No Stress Concern Present (08/23/2022)   Received from [facility], [facility]  Tobacco Use: Low Risk  (07/15/2023)  Health Literacy: Low Risk  (08/23/2022)   Received from [facility], [facility]   SDOH Interventions:     Readmission Risk Interventions     No data to display

## 2023-07-16 NOTE — Progress Notes (Signed)
PT Cancellation Note  Patient Details Name: Chris [name] MRN: [id number] DOB: September 05, 1941   Cancelled Treatment:    Reason Eval/Treat Not Completed: Patient at procedure or test/unavailable  EEG team in room working with pt. Will check back shortly for PT evaluation.  Kathlyn Sacramento, PT, DPT [facility]  Rehabilitation Services Physical Therapist Office: [phone] Website: [email or web address]  Berton Mount 07/16/2023, 10:05 AM

## 2023-07-16 NOTE — Evaluation (Signed)
Physical Therapy Evaluation Patient Details Name: Chris [name] MRN: [id number] DOB: August 10, 1941 Today's Date: 07/16/2023  History of Present Illness  82 yr old male presented 10/22 [facility] after being noted to be acutely altered, multiple recurrent seizures with continued waxing/waning, fell and hit his head in ED with scalp laceration. Transferred to [facility] for LTM EEG. Acute metabolic encephalopathy, suspected status epilepticus.  PMHx: hypertension, hyperlipidemia, CAD s/p four-vessel CABG, TIA, IDDM, osteomyelitis s/p left second toe amputation, and partial complex seizures.  Clinical Impression  Pt admitted with above diagnosis. Has been limited at home due to Lt 2nd toe amputation about 6 weeks ago. Wife assists, pt using standard walker for transfers and very short distance ambulation with precautions for WB through Lt heel only at this time. Pt required min assist for bed mobility, transfer, and lateral steps with frequent cues and assist to maintain WB through Lt heel only. I suspect he is weaker than baseline however pt and wife do not feel this contributes to his fall risk. I believe ST SNF would be beneficial however wife refuses and states she can care for him at home adequately. She was present and observed level of assist needed (min A.) Will follow and progress acutely.  Pt currently with functional limitations due to the deficits listed below (see PT Problem List). Pt will benefit from acute skilled PT to increase their independence and safety with mobility to allow discharge.           If plan is discharge home, recommend the following: A little help with walking and/or transfers;A little help with bathing/dressing/bathroom;Assistance with cooking/housework;Direct supervision/assist for medications management;Direct supervision/assist for financial management;Assist for transportation;Supervision due to cognitive status   Can travel by private vehicle        Equipment  Recommendations None recommended by PT  Recommendations for Other Services       Functional Status Assessment Patient has had a recent decline in their functional status and demonstrates the ability to make significant improvements in function in a reasonable and predictable amount of time.     Precautions / Restrictions Precautions Precautions: Fall Restrictions Weight Bearing Restrictions: Yes LLE Weight Bearing: Weight bearing as tolerated (Through HEEL only) Other Position/Activity Restrictions: Wife bringing in post-op shoes      Mobility  Bed Mobility Overal bed mobility: Needs Assistance Bed Mobility: Supine to Sit, Sit to Supine     Supine to sit: Contact guard Sit to supine: Min assist   General bed mobility comments: CGA to rise from bed to reach EOB and scoot forward. Min assist for LE support back into bed, limited by back pain.    Transfers Overall transfer level: Needs assistance Equipment used: Rolling walker (2 wheels) Transfers: Sit to/from Stand, Bed to chair/wheelchair/BSC Sit to Stand: Min assist           General transfer comment: Min assist for boost to stand with VC to maintain weightbearing through Lt heel. Poorly maintained. Needs assist to weight shift towards Rt foot to unload Lt forefoot. Heavy UE support on RW. Pt attempting to take steps despite instructions to remain in place for WB precautions. Progressed with lateral steps along bed, again with min assist to maintain WB through Lt heel. Poorly following instructions.    Ambulation/Gait               General Gait Details: Deferred due to difficulty maintaining WB through Lt heel only.  Stairs  Wheelchair Mobility     Tilt Bed    Modified Rankin (Stroke Patients Only)       Balance Overall balance assessment: Needs assistance Sitting-balance support: No upper extremity supported, Feet supported Sitting balance-Leahy Scale: Fair     Standing balance  support: Bilateral upper extremity supported Standing balance-Leahy Scale: Poor                               Pertinent Vitals/Pain Pain Assessment Pain Assessment: Faces Faces Pain Scale: Hurts whole lot Pain Location: back (reports chronic) Pain Descriptors / Indicators: Aching Pain Intervention(s): Monitored during session, Repositioned    Home Living Family/patient expects to be discharged to:: Private residence Living Arrangements: Spouse/significant other Available Help at Discharge: Family;Available 24 hours/day Type of Home: House Home Access: Level entry       Home Layout: One level Home Equipment: Standard Walker;Shower seat - built in      Prior Function Prior Level of Function : Needs assist             Mobility Comments: Pt had Lt 2nd toe amputation approx 6 weeks ago at [facility]; has been using standard walker for short distances only ADLs Comments: Wife assists with meals and bathing     Extremity/Trunk Assessment   Upper Extremity Assessment Upper Extremity Assessment: Defer to OT evaluation    Lower Extremity Assessment Lower Extremity Assessment: Generalized weakness       Communication   Communication Communication: No apparent difficulties Cueing Techniques: Verbal cues;Gestural cues  Cognition Arousal: Alert Behavior During Therapy: WFL for tasks assessed/performed Overall Cognitive Status: Impaired/Different from baseline Area of Impairment: Memory, Following commands, Safety/judgement, Problem solving                     Memory: Decreased short-term memory, Decreased recall of precautions Following Commands: Follows one step commands inconsistently, Follows one step commands with increased time Safety/Judgement: Decreased awareness of safety, Decreased awareness of deficits   Problem Solving: Slow processing, Decreased initiation, Difficulty sequencing, Requires verbal cues, Requires tactile cues           General Comments General comments (skin integrity, edema, etc.): Wife present, observed evaluation. States she had a bad experience at SNF and prefers him to go home with HHPT.    Exercises     Assessment/Plan    PT Assessment Patient needs continued PT services  PT Problem List Decreased strength;Decreased range of motion;Decreased activity tolerance;Decreased balance;Decreased mobility;Decreased cognition;Decreased knowledge of use of DME;Decreased safety awareness;Decreased knowledge of precautions;Pain       PT Treatment Interventions DME instruction;Gait training;Functional mobility training;Therapeutic activities;Therapeutic exercise;Balance training;Neuromuscular re-education;Cognitive remediation;Patient/family education;Wheelchair mobility training;Modalities    PT Goals (Current goals can be found in the Care Plan section)  Acute Rehab PT Goals Patient Stated Goal: go home PT Goal Formulation: With patient/family Time For Goal Achievement: 07/30/23 Potential to Achieve Goals: Good    Frequency Min 1X/week     Co-evaluation               AM-PAC PT "6 Clicks" Mobility  Outcome Measure Help needed turning from your back to your side while in a flat bed without using bedrails?: A Little Help needed moving from lying on your back to sitting on the side of a flat bed without using bedrails?: A Little Help needed moving to and from a bed to a chair (including a wheelchair)?: A Little Help needed standing up  from a chair using your arms (e.g., wheelchair or bedside chair)?: A Little Help needed to walk in hospital room?: A Lot Help needed climbing 3-5 steps with a railing? : Total 6 Click Score: 15    End of Session Equipment Utilized During Treatment: Gait belt Activity Tolerance: Patient tolerated treatment well Patient left: in bed;with call bell/phone within reach;with bed alarm set;with family/visitor present Nurse Communication: Mobility status PT Visit  Diagnosis: Unsteadiness on feet (R26.81);Muscle weakness (generalized) (M62.81);History of falling (Z91.81);Difficulty in walking, not elsewhere classified (R26.2);Other symptoms and signs involving the nervous system (R29.898);Pain Pain - part of body:  (back)    Time: 1324-1401 PT Time Calculation (min) (ACUTE ONLY): 37 min   Charges:   PT Evaluation $PT Eval Moderate Complexity: 1 Mod PT Treatments $Therapeutic Activity: 8-22 mins PT General Charges $$ ACUTE PT VISIT: 1 Visit         Kathlyn Sacramento, PT, DPT [facility]  Rehabilitation Services Physical Therapist Office: [phone] Website: [email or web address]   Chris [name] 07/16/2023, 2:31 PM

## 2023-07-16 NOTE — Plan of Care (Signed)

## 2023-07-17 ENCOUNTER — Inpatient Hospital Stay (HOSPITAL_COMMUNITY): Payer: Medicare Other

## 2023-07-17 DIAGNOSIS — R569 Unspecified convulsions: Secondary | ICD-10-CM | POA: Diagnosis not present

## 2023-07-17 LAB — GLUCOSE, CAPILLARY
Glucose-Capillary: 80 mg/dL (ref 70–99)
Glucose-Capillary: 169 mg/dL — ABNORMAL HIGH (ref 70–99)
Glucose-Capillary: 141 mg/dL — ABNORMAL HIGH (ref 70–99)
Glucose-Capillary: 194 mg/dL — ABNORMAL HIGH (ref 70–99)

## 2023-07-17 MED ORDER — LEVETIRACETAM 500 MG PO TABS
500.0000 mg | ORAL_TABLET | Freq: Two times a day (BID) | ORAL | Status: DC
  Administered 2023-07-17 – 2023-07-21 (×8): 500 mg via ORAL
  Filled 2023-07-17 (×8): qty 1

## 2023-07-17 MED ORDER — OXYCODONE-ACETAMINOPHEN 5-325 MG PO TABS
1.0000 | ORAL_TABLET | Freq: Four times a day (QID) | ORAL | Status: DC | PRN
  Administered 2023-07-17 – 2023-07-21 (×11): 1 via ORAL
  Filled 2023-07-17 (×12): qty 1

## 2023-07-17 MED ORDER — OXYCODONE-ACETAMINOPHEN 5-325 MG PO TABS
1.0000 | ORAL_TABLET | Freq: Once | ORAL | Status: AC
  Administered 2023-07-17: 1 via ORAL
  Filled 2023-07-17: qty 1

## 2023-07-17 NOTE — Progress Notes (Signed)
LTM maint complete - no skin breakdown under:  Fp1, f4.

## 2023-07-17 NOTE — Progress Notes (Signed)
NEUROLOGY CONSULT FOLLOW UP NOTE   Date of service: July 17, 2023 Patient Name: Chris [name] MRN:  [id number] DOB:  August 26, 1941  Brief HPI  Chris [name] is a 82 y.o. male who presented with  multiple recurrent seizures with continued waxing/waning transferred for LTM EEG.  Keppra was added on 10/22 secondary to an episode in the morning while still at [facility] concerning for seizure.   Interval Hx/subjective   No further seizures  Vitals   Vitals:   07/16/23 0753 07/16/23 2039 07/17/23 0000 07/17/23 0358  BP:  (!) 153/59 (!) 152/62 (!) 145/58  Pulse:  69 69 66  Resp:  16 16 16   Temp:  98.4 F (36.9 C) 98.5 F (36.9 C) 98.6 F (37 C)  TempSrc:  Oral Axillary Oral  SpO2:  100% 96% 95%  Weight: 92.4 kg     Height: 6\' 2"  (1.88 m)        Body mass index is 26.15 kg/m.  Physical Exam   General: In bed, bandage in place on the forehead  Neurologic Examination   MS: Awake, alert knows where he is, gives the correct month and year CN: Visual fields are full, EOMI Motor: Full strength throughout Sensory: Intact to light touch  Labs and Diagnostic Imaging   CBC:  Recent Labs  Lab 07/14/23 0320 07/15/23 0607 07/16/23 0436  WBC 12.0* 12.5* 8.7  NEUTROABS 9.4*  --   --   HGB 13.2 11.8* 10.3*  HCT 40.3 34.4* 31.5*  MCV 94.4 91.5 95.7  PLT 180 136* 104*    Basic Metabolic Panel:  Lab Results  Component Value Date   NA 145 07/16/2023   K 4.2 07/16/2023   CO2 25 07/16/2023   GLUCOSE 170 (H) 07/16/2023   BUN 36 (H) 07/16/2023   CREATININE 1.08 07/16/2023   CALCIUM 8.6 (L) 07/16/2023   GFRNONAA >60 07/16/2023   GFRAA 39 (L) 07/28/2019   Lipid Panel: No results found for: "LDLCALC" HgbA1c:  Lab Results  Component Value Date   HGBA1C 6.8 (H) 07/14/2023   Urine Drug Screen:     Component Value Date/Time   LABOPIA NONE DETECTED 07/14/2023 1419   COCAINSCRNUR NONE DETECTED 07/14/2023 1419   LABBENZ NONE DETECTED 07/14/2023 1419   AMPHETMU  NONE DETECTED 07/14/2023 1419   THCU NONE DETECTED 07/14/2023 1419   LABBARB NONE DETECTED 07/14/2023 1419    Alcohol Level     Component Value Date/Time   ETH <10 07/14/2023 0336   INR  Lab Results  Component Value Date   INR 1.1 07/14/2023   APTT  Lab Results  Component Value Date   APTT 29 07/14/2023    Impression   Chris [name] is a 82 y.o. male who presented with recurrent seizures and has had persistent waxing/waning mental status with concern for intermittent focal seizures.  He was initially managed on Vimpat 200 twice daily.  He had a least one episode concerning for seizure on the morning of 10/22 after being on lacosamide 200 twice daily and therefore Keppra was added.  He continues to improve and is now seizure-free.  It may be reasonable to wean back to a single agent at some point down the line, but for now I would favor continuing Keppra.  Recommendations  Continue Vimpat 200 twice daily Keppra 500 mg twice daily Can discontinue LTM EEG Neurology will be available as needed. ______________________________________________________________________   Thank you for the opportunity to take part in the care of this patient.  If you have any further questions, please contact the neurology consultation team on call. Updated oncall schedule is listed on AMION.  Signed,  Ritta Slot, MD [facility] [phone]  If 7pm- 7am, please page neurology on call as listed in AMION.

## 2023-07-17 NOTE — Progress Notes (Signed)
Physical Therapy Treatment Patient Details Name: Chris [name] MRN: [id number] DOB: 12/15/1940 Today's Date: 07/17/2023   History of Present Illness 82 yr old male presented 10/22 [facility] after being noted to be acutely altered, multiple recurrent seizures with continued waxing/waning, fell and hit his head in ED with scalp laceration. Transferred to [facility] for LTM EEG. Acute metabolic encephalopathy, suspected status epilepticus.  PMHx: hypertension, hyperlipidemia, CAD s/p four-vessel CABG, TIA, IDDM, osteomyelitis s/p left second toe amputation, and partial complex seizures.    PT Comments  Requires up to min assist and significant time for bed mobility and with rising to standing position from elevated bed surface. Heavy reliance on RW for support. Limited primarily by back pain which he reports is chronic but worse than usual. Pt needs assist to maintain standing with weight bearing through Lt heel only due to recent 2nd toe amputation. Wife present, now wants to consider SNF. SW notified. Participated in pre-gait activity but poorly tolerated and could not progress with forward gait safely. Patient will continue to benefit from skilled physical therapy services to further improve independence with functional mobility. Patient will benefit from continued inpatient follow up therapy, <3 hours/day      If plan is discharge home, recommend the following: A little help with bathing/dressing/bathroom;Assistance with cooking/housework;Direct supervision/assist for medications management;Direct supervision/assist for financial management;Assist for transportation;Supervision due to cognitive status;A lot of help with walking and/or transfers   Can travel by private vehicle     No  Equipment Recommendations  None recommended by PT    Recommendations for Other Services       Precautions / Restrictions Precautions Precautions: Fall Restrictions Weight Bearing Restrictions: Yes (THROUGH HEEL  ONLY ON LT) LLE Weight Bearing: Weight bearing as tolerated (Through HEEL ONLY) Other Position/Activity Restrictions: post-op shoes     Mobility  Bed Mobility Overal bed mobility: Needs Assistance Bed Mobility: Supine to Sit, Sit to Supine     Supine to sit: Contact guard Sit to supine: Min assist   General bed mobility comments: CGA to rise, slowly to EOB using rail as needed. Very guarded. Min assist for LE support back in bed. Difficulty following instructions for log roll    Transfers Overall transfer level: Needs assistance Equipment used: Rolling walker (2 wheels) Transfers: Sit to/from Stand Sit to Stand: Min assist, From elevated surface           General transfer comment: Min assist for boost to stand from elevated surface. VC for hand placement. Weight shift, and foot placement to WB through Lt heel only.    Ambulation/Gait             Pre-gait activities: Tolerated minimal pre-gait activities. Assisted with weight shift and walker placement. able to advance ea LE forward backwards and laterally but could not tolerate progression into forward gait. Min assist to unload Lt foot and keep weight through Lt heel only.     Stairs             Wheelchair Mobility     Tilt Bed    Modified Rankin (Stroke Patients Only)       Balance Overall balance assessment: Needs assistance Sitting-balance support: No upper extremity supported, Feet supported Sitting balance-Leahy Scale: Fair     Standing balance support: Bilateral upper extremity supported Standing balance-Leahy Scale: Poor                              Cognition  Arousal: Alert Behavior During Therapy: WFL for tasks assessed/performed Overall Cognitive Status: Impaired/Different from baseline Area of Impairment: Memory, Following commands, Safety/judgement, Problem solving                     Memory: Decreased short-term memory, Decreased recall of  precautions Following Commands: Follows one step commands inconsistently, Follows one step commands with increased time Safety/Judgement: Decreased awareness of safety, Decreased awareness of deficits   Problem Solving: Slow processing, Decreased initiation, Difficulty sequencing, Requires verbal cues, Requires tactile cues          Exercises      General Comments General comments (skin integrity, edema, etc.): Wife present, now states she would like to consider SNF if she cannot care for him at home...      Pertinent Vitals/Pain Pain Assessment Pain Assessment: Faces Faces Pain Scale: Hurts whole lot Pain Location: back (reports chronic, but somewhat worse) Pain Descriptors / Indicators: Aching Pain Intervention(s): Monitored during session, Repositioned, Limited activity within patient's tolerance    Home Living                          Prior Function            PT Goals (current goals can now be found in the care plan section) Acute Rehab PT Goals Patient Stated Goal: go home PT Goal Formulation: With patient/family Time For Goal Achievement: 07/30/23 Potential to Achieve Goals: Good Progress towards PT goals: Progressing toward goals    Frequency    Min 1X/week      PT Plan      Co-evaluation              AM-PAC PT "6 Clicks" Mobility   Outcome Measure  Help needed turning from your back to your side while in a flat bed without using bedrails?: A Little Help needed moving from lying on your back to sitting on the side of a flat bed without using bedrails?: A Little Help needed moving to and from a bed to a chair (including a wheelchair)?: A Little Help needed standing up from a chair using your arms (e.g., wheelchair or bedside chair)?: A Little Help needed to walk in hospital room?: A Lot Help needed climbing 3-5 steps with a railing? : Total 6 Click Score: 15    End of Session Equipment Utilized During Treatment: Gait belt Activity  Tolerance: Patient limited by pain Patient left: in bed;with call bell/phone within reach;with bed alarm set;with family/visitor present Nurse Communication: Mobility status;Need for lift equipment PT Visit Diagnosis: Unsteadiness on feet (R26.81);Muscle weakness (generalized) (M62.81);History of falling (Z91.81);Difficulty in walking, not elsewhere classified (R26.2);Other symptoms and signs involving the nervous system (R29.898);Pain Pain - part of body:  (back)     Time: 4742-5956 PT Time Calculation (min) (ACUTE ONLY): 23 min  Charges:    $Therapeutic Activity: 23-37 mins PT General Charges $$ ACUTE PT VISIT: 1 Visit                     Chris [name], PT, DPT [facility]  Rehabilitation Services Physical Therapist Office: [phone] Website: [email or web address]    Berton Mount 07/17/2023, 12:05 PM

## 2023-07-17 NOTE — Plan of Care (Signed)
  Problem: Pain Management: Goal: General experience of comfort will improve Outcome: Progressing   Problem: Safety: Goal: Ability to remain free from injury will improve Outcome: Progressing

## 2023-07-17 NOTE — Progress Notes (Signed)
PROGRESS NOTE    Chris [name]  VWU:[id number] DOB: 1941/03/07 DOA: 07/15/2023 PCP: Hoover Browns, MD   Brief Narrative:  This 82 yrs old male with PMH significant for hypertension, hyperlipidemia, CAD s/p four-vessel CABG, TIA, IDDM, osteomyelitis s/p right second toe amputation, and partial complex seizures who presents after being noted to be acutely altered.  Patient woke up yesterday morning around 1 AM with confusion and has twitching like movements of the left side, new right-sided weakness, staring spells and difficulty communicating.  EMS was called and patient was brought as stroke code. Patient follows up with [facility] neurology since summer for the history of seizures.  He had been on Keppra but due to significant drowsiness he was switched to Vimpat recently.  CT head negative for acute stroke.  CTA head and neck negative for LVO.  Patient is not considered to be a thrombolytic candidate.  Patient became agitated while in the ED and fell from the bed hitting his head and left arm.  Trauma scans were negative for any fracture or dislocation.  EEG showed diffuse slowing but no evidence of seizures.  Neurology had recommended continuous EEG due to concern for status epilepticus.  Patient was loaded with Vimpat and was given Ativan.  Long-term EEG is in progress.  Assessment & Plan:   Principal Problem:   Seizure (HCC) Active Problems:   Acute metabolic encephalopathy   Fall   Controlled type 2 diabetes mellitus without complication, without long-term current use of insulin (HCC)   AKI (acute kidney injury) (HCC)   Chronic ulcer of left foot (HCC)   Essential hypertension   HLD (hyperlipidemia)   Hypothyroidism (acquired)  Acute metabolic encephalopathy: Suspected status epilepticus: Patient presented with confusion,  twitching like movements of left side, new right-sided weakness, staring spells and difficulty communicating. Initial concern was for possible stroke. CT  and subsequent CTA H/N did not note any acute abnormality or large vessel occlusion.   Patient was not a thrombolytics candidate.  Subsequent MRI of the brain was negative.   EEG showed diffuse slowing with superimposed L focal slowing and overriding beta frequencies. He had been given Ativan IV and subsequently loaded with Vimpat 200 mg IV. Continue Seizure precautions Frequent Neuro checks Continuous EEG monitoring Continue Vimpat, Continue Keppra 1gm twice daily. Appreciate neurology consultative services. EEG does not reveal evidence of seizures or epileptiform discharges.   Gait disturbance: Fall with Scalp laceration: Patient was noted to be unsteady on his feet and had fallen while in the ED prior to transfer hitting his head.   CT scan head, maxillofacial, and cervical spine did not note any acute fracture.   After transfer patient noted to have significant bleeding from the scalp laceration which had been previously managed with Steri-Strips. PT/OT to eval and treat General Surgery consulted to repair scalp laceration.  No plans for any scalp laceration repair.  Continue pressure dressing.   Type 2 diabetes mellitus with hyperglycemia: HbA1c noted to be 6.8 on 10/21.   Patient was discharged on Farxiga 10 mg,  75/25 insulin 30 units twice daily, and lispro insulin 0 to 15 units 3 times daily with meals. Continue Hypoglycemic protocol. Continue Farxiga CBGs before every meal with sensitive SSI   Acute kidney injury: > Resolved. Likely prerenal.  Serum creatinine improved with IV hydration. Renal functions back to normal.   Chronic left foot ulcer: Peripheral vascular disease: No signs for infection appreciated on exam.   X-rays did not note any signs for  osteomyelitis. Continue aspirin and pentoxifylline   Essential hypertension Continue losartan 25 mg dialy.   Hypothyroidism: Continue levothyroxine.   Hyperlipidemia: Continue atorvastatin.   DVT prophylaxis:  SCDs Code Status:  Full code Family Communication: No family at bed side. Disposition Plan:     Status is: Inpatient Remains inpatient appropriate because: Admitted for  CVA , workup, EEG is in progress.   Consultants:  Neurology  Procedures:CT H, MRI, EEG  Antimicrobials:  Anti-infectives (From admission, onward)    None      Subjective: Patient was seen and examined at bedside.  Overnight events noted.   Patient reports doing better.  He has visible laceration on the frontal scalp.  Pressure dressing is noted , dried blood noted.  Objective: Vitals:   07/16/23 0753 07/16/23 2039 07/17/23 0000 07/17/23 0358  BP:  (!) 153/59 (!) 152/62 (!) 145/58  Pulse:  69 69 66  Resp:  16 16 16   Temp:  98.4 F (36.9 C) 98.5 F (36.9 C) 98.6 F (37 C)  TempSrc:  Oral Axillary Oral  SpO2:  100% 96% 95%  Weight: 92.4 kg     Height: 6\' 2"  (1.88 m)       Intake/Output Summary (Last 24 hours) at 07/17/2023 1303 Last data filed at 07/17/2023 1233 Gross per 24 hour  Intake 3149.45 ml  Output 1270 ml  Net 1879.45 ml   Filed Weights   07/16/23 0753  Weight: 92.4 kg    Examination:  General exam: Appears comfortable, not in any acute distress.Laceration noted on the front of head. Respiratory system: CTA bilaterally. Respiratory effort normal.  RR 14 Cardiovascular system: S1 & S2 heard, RRR. No JVD, murmurs, rubs, gallops or clicks. No pedal edema. Gastrointestinal system: Abdomen is non distended, soft and non tender. Normal bowel sounds heard. Central nervous system: Alert and oriented x3. No focal neurological deficits. Extremities: No edema, no cyanosis, no clubbing Skin: No rashes, lesions or ulcers Psychiatry: Judgement and insight appear normal. Mood & affect appropriate.     Data Reviewed: I have personally reviewed following labs and imaging studies  CBC: Recent Labs  Lab 07/14/23 0320 07/15/23 0607 07/16/23 0436  WBC 12.0* 12.5* 8.7  NEUTROABS 9.4*  --    --   HGB 13.2 11.8* 10.3*  HCT 40.3 34.4* 31.5*  MCV 94.4 91.5 95.7  PLT 180 136* 104*   Basic Metabolic Panel: Recent Labs  Lab 07/14/23 0320 07/15/23 0607 07/16/23 0436  NA 136 142 145  K 4.2 4.1 4.2  CL 97* 105 111  CO2 24 26 25   GLUCOSE 315* 250* 170*  BUN 41* 50* 36*  CREATININE 1.43* 1.28* 1.08  CALCIUM 8.9 8.7* 8.6*   GFR: Estimated Creatinine Clearance: 62.4 mL/min (by C-G formula based on SCr of 1.08 mg/dL). Liver Function Tests: Recent Labs  Lab 07/14/23 0320  AST 23  ALT 21  ALKPHOS 109  BILITOT 0.8  PROT 7.6  ALBUMIN 4.2   No results for input(s): "LIPASE", "AMYLASE" in the last 168 hours. No results for input(s): "AMMONIA" in the last 168 hours. Coagulation Profile: Recent Labs  Lab 07/14/23 0320  INR 1.1   Cardiac Enzymes: No results for input(s): "CKTOTAL", "CKMB", "CKMBINDEX", "TROPONINI" in the last 168 hours. BNP (last 3 results) No results for input(s): "PROBNP" in the last 8760 hours. HbA1C: No results for input(s): "HGBA1C" in the last 72 hours.  CBG: Recent Labs  Lab 07/16/23 1139 07/16/23 1601 07/16/23 2113 07/17/23 0608 07/17/23 1138  GLUCAP 301*  114* 103* 80 169*   Lipid Profile: No results for input(s): "CHOL", "HDL", "LDLCALC", "TRIG", "CHOLHDL", "LDLDIRECT" in the last 72 hours. Thyroid Function Tests: Recent Labs    07/15/23 2009  TSH 0.294*   Anemia Panel: No results for input(s): "VITAMINB12", "FOLATE", "FERRITIN", "TIBC", "IRON", "RETICCTPCT" in the last 72 hours. Sepsis Labs: No results for input(s): "PROCALCITON", "LATICACIDVEN" in the last 168 hours.  No results found for this or any previous visit (from the past 240 hour(s)).   Radiology Studies: Overnight EEG with video  Result Date: 07/17/2023 Charlsie Quest, MD     07/17/2023  9:41 AM Patient Name: Maddison Striegel MRN: [id number] Epilepsy Attending: Charlsie Quest Referring Physician/Provider: Rejeana Brock, MD Duration: 07/16/2023  1045 to 07/17/2023 0940 Patient history:  82 y.o. male who presented with recurrent seizures and has had persistent waxing/waning mental status. EEG to evaluate for intermittent focal seizures.  Level of alertness: Awake, asleep AEDs during EEG study: LEV Technical aspects: This EEG study was done with scalp electrodes positioned according to the 10-20 International system of electrode placement. Electrical activity was reviewed with band pass filter of 1-70Hz , sensitivity of 7 uV/mm, display speed of 2mm/sec with a 60Hz  notched filter applied as appropriate. EEG data were recorded continuously and digitally stored.  Video monitoring was available and reviewed as appropriate. Description: The posterior dominant rhythm consists of 8 Hz activity of moderate voltage (25-35 uV) seen predominantly in posterior head regions, symmetric and reactive to eye opening and eye closing. Sleep was characterized by vertex waves, sleep spindles (12 to 14 Hz), maximal frontocentral region. EEG showed continuous generalized 3 to 6 Hz theta-delta slowing. Hyperventilation and photic stimulation were not performed.   ABNORMALITY - Continuous slow, generalized IMPRESSION: This study is suggestive of mild to moderate diffuse encephalopathy. No seizures or epileptiform discharges were seen throughout the recording. Priyanka Annabelle Harman    Scheduled Meds:  aspirin EC  81 mg Oral Daily   atorvastatin  80 mg Oral Daily   dapagliflozin propanediol  10 mg Oral Daily   influenza vaccine adjuvanted  0.5 mL Intramuscular Tomorrow-1000   insulin aspart  0-5 Units Subcutaneous QHS   insulin aspart  0-9 Units Subcutaneous TID WC   insulin aspart protamine- aspart  30 Units Subcutaneous BID WC   lacosamide  200 mg Oral BID   levETIRAcetam  500 mg Oral BID   levothyroxine  88 mcg Oral Q0600   losartan  25 mg Oral Daily   pentoxifylline  400 mg Oral BID   sodium chloride flush  3 mL Intravenous Q12H   Continuous Infusions:  sodium  chloride 100 mL/hr at 07/17/23 0425     LOS: 2 days    Time spent: 35 mins    Willeen Niece, MD [facility]   If 7PM-7AM, please contact night-coverage

## 2023-07-17 NOTE — Procedures (Addendum)
Patient Name: Chris [name]  MRN: [id number]  Epilepsy Attending: Charlsie Quest  Referring Physician/Provider: Rejeana Brock, MD  Duration: 07/16/2023 1045 to 07/17/2023 1045  Patient history:  82 y.o. male who presented with recurrent seizures and has had persistent waxing/waning mental status. EEG to evaluate for intermittent focal seizures.    Level of alertness: Awake, asleep  AEDs during EEG study: LEV  Technical aspects: This EEG study was done with scalp electrodes positioned according to the 10-20 International system of electrode placement. Electrical activity was reviewed with band pass filter of 1-70Hz , sensitivity of 7 uV/mm, display speed of 101mm/sec with a 60Hz  notched filter applied as appropriate. EEG data were recorded continuously and digitally stored.  Video monitoring was available and reviewed as appropriate.  Description: The posterior dominant rhythm consists of 8 Hz activity of moderate voltage (25-35 uV) seen predominantly in posterior head regions, symmetric and reactive to eye opening and eye closing. Sleep was characterized by vertex waves, sleep spindles (12 to 14 Hz), maximal frontocentral region. EEG showed continuous generalized 3 to 6 Hz theta-delta slowing. Hyperventilation and photic stimulation were not performed.     ABNORMALITY - Continuous slow, generalized  IMPRESSION: This study is suggestive of mild to moderate diffuse encephalopathy. No seizures or epileptiform discharges were seen throughout the recording.  Keimora Swartout Annabelle Harman

## 2023-07-17 NOTE — Progress Notes (Signed)
LTM EEG discontinued - no skin breakdown at unhook.  

## 2023-07-17 NOTE — Procedures (Addendum)
Patient Name: Chris [name]  MRN: [id number]  Epilepsy Attending: Charlsie Quest  Referring Physician/Provider: Rejeana Brock, MD  Duration: 07/17/2023 1045 to 07/17/2023 1249   Patient history:  82 y.o. male who presented with recurrent seizures and has had persistent waxing/waning mental status. EEG to evaluate for intermittent focal seizures.     Level of alertness: Awake   AEDs during EEG study: LEV   Technical aspects: This EEG study was done with scalp electrodes positioned according to the 10-20 International system of electrode placement. Electrical activity was reviewed with band pass filter of 1-70Hz , sensitivity of 7 uV/mm, display speed of 70mm/sec with a 60Hz  notched filter applied as appropriate. EEG data were recorded continuously and digitally stored.  Video monitoring was available and reviewed as appropriate.   Description: The posterior dominant rhythm consists of 8 Hz activity of moderate voltage (25-35 uV) seen predominantly in posterior head regions, symmetric and reactive to eye opening and eye closing. EEG showed continuous generalized 3 to 6 Hz theta-delta slowing. Hyperventilation and photic stimulation were not performed.      ABNORMALITY - Continuous slow, generalized   IMPRESSION: This study is suggestive of mild to moderate diffuse encephalopathy. No seizures or epileptiform discharges were seen throughout the recording.   Charlesia Canaday Annabelle Harman

## 2023-07-18 DIAGNOSIS — R569 Unspecified convulsions: Secondary | ICD-10-CM | POA: Diagnosis not present

## 2023-07-18 LAB — CBC
HCT: 31.1 % — ABNORMAL LOW (ref 39.0–52.0)
Hemoglobin: 10.2 g/dL — ABNORMAL LOW (ref 13.0–17.0)
MCH: 30.6 pg (ref 26.0–34.0)
MCHC: 32.8 g/dL (ref 30.0–36.0)
MCV: 93.4 fL (ref 80.0–100.0)
Platelets: 120 10*3/uL — ABNORMAL LOW (ref 150–400)
RBC: 3.33 MIL/uL — ABNORMAL LOW (ref 4.22–5.81)
RDW: 12.7 % (ref 11.5–15.5)
WBC: 5.3 10*3/uL (ref 4.0–10.5)
nRBC: 0 % (ref 0.0–0.2)

## 2023-07-18 LAB — GLUCOSE, CAPILLARY
Glucose-Capillary: 136 mg/dL — ABNORMAL HIGH (ref 70–99)
Glucose-Capillary: 253 mg/dL — ABNORMAL HIGH (ref 70–99)
Glucose-Capillary: 195 mg/dL — ABNORMAL HIGH (ref 70–99)
Glucose-Capillary: 111 mg/dL — ABNORMAL HIGH (ref 70–99)

## 2023-07-18 MED ORDER — POLYETHYLENE GLYCOL 3350 17 G PO PACK
17.0000 g | PACK | Freq: Every day | ORAL | Status: DC
  Administered 2023-07-18 – 2023-07-20 (×3): 17 g via ORAL
  Filled 2023-07-18 (×3): qty 1

## 2023-07-18 MED ORDER — SENNOSIDES-DOCUSATE SODIUM 8.6-50 MG PO TABS
1.0000 | ORAL_TABLET | Freq: Two times a day (BID) | ORAL | Status: DC
  Administered 2023-07-18 – 2023-07-21 (×7): 1 via ORAL
  Filled 2023-07-18 (×7): qty 1

## 2023-07-18 NOTE — Plan of Care (Signed)

## 2023-07-18 NOTE — NC FL2 (Signed)
[place] MEDICAID FL2 LEVEL OF CARE FORM     IDENTIFICATION  Patient Name: Chris [name] Birthdate: 09-Jun-1941 Sex: male Admission Date (Current Location): 07/15/2023  [facility] and [place] Number:      Facility and Address:  [facility], [address], [place], [place] [address]      Provider Number: [id number]  Attending Physician Name and Address:  Willeen Niece, MD  Relative Name and Phone Number:       Current Level of Care: Hospital Recommended Level of Care: Skilled Nursing Facility Prior Approval Number:    Date Approved/Denied:   PASRR Number: [id number]  Discharge Plan: SNF    Current Diagnoses: Patient Active Problem List   Diagnosis Date Noted   Acute metabolic encephalopathy 07/15/2023   Fall 07/15/2023   AKI (acute kidney injury) (HCC) 07/15/2023   Chronic ulcer of left foot (HCC) 07/15/2023   Seizure (HCC) 07/14/2023   Syncope 11/18/2022   Bradycardia 11/18/2022   Hypoglycemia 11/18/2022   CVA (cerebral vascular accident) (HCC) 07/18/2019   Ulcer of ankle (HCC) 04/26/2019   Pain due to onychomycosis of toenails of both feet 03/15/2019   Dysphagia 10/29/2016   Impaired functional mobility, balance, gait, and endurance 10/29/2016   Memory impairment 10/29/2016   Right hip pain 10/29/2016   Tremors of nervous system 10/29/2016   Carpal tunnel syndrome 08/07/2016   Coronary artery disease involving coronary bypass graft of native heart without angina pectoris 07/18/2016   Essential hypertension 07/18/2016   HLD (hyperlipidemia) 07/18/2016   Hypothyroidism (acquired) 07/18/2016   Paresthesias 07/18/2016   Risk for falls 07/18/2016   Type 2 diabetes mellitus with diabetic neuropathy, without long-term current use of insulin (HCC) 07/18/2016   Controlled type 2 diabetes mellitus without complication, without long-term current use of insulin (HCC) 07/18/2016    Orientation RESPIRATION BLADDER Height & Weight      Self, Time, Situation, Place  Normal Continent Weight: 203 lb 11.3 oz (92.4 kg) Height:  6\' 2"  (188 cm)  BEHAVIORAL SYMPTOMS/MOOD NEUROLOGICAL BOWEL NUTRITION STATUS      Continent Diet (Heart healthy carb modified)  AMBULATORY STATUS COMMUNICATION OF NEEDS Skin   Extensive Assist Verbally Surgical wounds, Skin abrasions (Skin tear Elbow, diabetic ulcer right foot, toe amputation)                       Personal Care Assistance Level of Assistance  Bathing, Feeding, Dressing Bathing Assistance: Limited assistance Feeding assistance: Limited assistance Dressing Assistance: Limited assistance     Functional Limitations Info  Sight Sight Info: Impaired        SPECIAL CARE FACTORS FREQUENCY  PT (By licensed PT), OT (By licensed OT)     PT Frequency: 5x/week OT Frequency: 5x/week            Contractures      Additional Factors Info  Code Status, Allergies Code Status Info: Full Allergies Info: Lisinopril           Current Medications (07/18/2023):  This is the current hospital active medication list Current Facility-Administered Medications  Medication Dose Route Frequency Provider Last Rate Last Admin   0.9 %  sodium chloride infusion   Intravenous Continuous Madelyn Flavors A, MD 100 mL/hr at 07/17/23 1747 New Bag at 07/17/23 1747   acetaminophen (TYLENOL) tablet 650 mg  650 mg Oral Q6H PRN Madelyn Flavors A, MD   650 mg at 07/17/23 1311   Or   acetaminophen (TYLENOL) suppository 650  mg  650 mg Rectal Q6H PRN Madelyn Flavors A, MD       albuterol (PROVENTIL) (2.5 MG/3ML) 0.083% nebulizer solution 2.5 mg  2.5 mg Nebulization Q6H PRN Clydie Braun, MD       aspirin EC tablet 81 mg  81 mg Oral Daily Smith, Rondell A, MD   81 mg at 07/18/23 1002   atorvastatin (LIPITOR) tablet 80 mg  80 mg Oral Daily Smith, Rondell A, MD   80 mg at 07/18/23 1002   cyclobenzaprine (FLEXERIL) tablet 10 mg  10 mg Oral TID PRN Willeen Niece, MD   10 mg at 07/17/23 2025    dapagliflozin propanediol (FARXIGA) tablet 10 mg  10 mg Oral Daily Katrinka Blazing, Rondell A, MD   10 mg at 07/18/23 1002   influenza vaccine adjuvanted (FLUAD) injection 0.5 mL  0.5 mL Intramuscular Tomorrow-1000 Smith, Rondell A, MD       insulin aspart (novoLOG) injection 0-5 Units  0-5 Units Subcutaneous QHS Madelyn Flavors A, MD   5 Units at 07/15/23 2140   insulin aspart (novoLOG) injection 0-9 Units  0-9 Units Subcutaneous TID WC Madelyn Flavors A, MD   1 Units at 07/18/23 0717   insulin aspart protamine- aspart (NOVOLOG MIX 70/30) injection 30 Units  30 Units Subcutaneous BID WC Smith, Rondell A, MD   30 Units at 07/18/23 1000   lacosamide (VIMPAT) tablet 200 mg  200 mg Oral BID Madelyn Flavors A, MD   200 mg at 07/18/23 1002   levETIRAcetam (KEPPRA) tablet 500 mg  500 mg Oral BID Rejeana Brock, MD   500 mg at 07/18/23 1002   levothyroxine (SYNTHROID) tablet 88 mcg  88 mcg Oral Q0600 Madelyn Flavors A, MD   88 mcg at 07/18/23 0865   losartan (COZAAR) tablet 25 mg  25 mg Oral Daily Smith, Rondell A, MD   25 mg at 07/18/23 1002   ondansetron (ZOFRAN) tablet 4 mg  4 mg Oral Q6H PRN Madelyn Flavors A, MD       Or   ondansetron (ZOFRAN) injection 4 mg  4 mg Intravenous Q6H PRN Madelyn Flavors A, MD       oxyCODONE-acetaminophen (PERCOCET/ROXICET) 5-325 MG per tablet 1 tablet  1 tablet Oral Q6H PRN Willeen Niece, MD   1 tablet at 07/17/23 2026   pentoxifylline (TRENTAL) CR tablet 400 mg  400 mg Oral BID Madelyn Flavors A, MD   400 mg at 07/18/23 1001   sodium chloride flush (NS) 0.9 % injection 3 mL  3 mL Intravenous Q12H Madelyn Flavors A, MD   3 mL at 07/16/23 7846     Discharge Medications: Please see discharge summary for a list of discharge medications.  Relevant Imaging Results:  Relevant Lab Results:   Additional Information SSN:[id number]  Brylyn Novakovich Felipa Emory, Student-Social Work

## 2023-07-18 NOTE — TOC Progression Note (Addendum)
Transition of Care [facility]) - Progression Note    Patient Details  Name: Chris [name] MRN: [id number] Date of Birth: 1941/03/04  [facility] CM/SW Contact  Erin Sons, [place] Phone Number: 07/18/2023, 1:33 PM  Clinical Narrative:     CSW met with pt and pt's spouse bedside. They confirmed desire for SNF. CSW explained SNF w/u process and insurance coverage. Pt previously was at a SNF in [place] in September for 3 days where he had a negative experience.They would prefer a facility close to their home in [place]. Fl2 completed and bed request sent in hub. Medicare website emailed to spouse at [email or web address]  Expected Discharge Plan: Skilled Nursing Facility Barriers to Discharge: English as a second language teacher, SNF Pending bed offer  Expected Discharge Plan and Services   Discharge Planning Services: CM Consult   Living arrangements for the past 2 months: Single Family Home                                       Social Determinants of Health (SDOH) Interventions SDOH Screenings   Food Insecurity: No Food Insecurity (07/15/2023)  Housing: Patient Declined (07/15/2023)  Transportation Needs: No Transportation Needs (07/15/2023)  Utilities: Not At Risk (07/15/2023)  Financial Resource Strain: Low Risk  (08/23/2022)   Received from [facility], [facility]  Physical Activity: Insufficiently Active (08/23/2022)   Received from [facility], [facility]  Social Connections: Moderately Integrated (08/23/2022)   Received from [facility], [facility]  Stress: No Stress Concern Present (08/23/2022)   Received from [facility], [facility]  Tobacco Use: Low Risk  (07/15/2023)  Health Literacy: Low Risk  (08/23/2022)   Received from [facility], [facility]    Readmission Risk Interventions     No data to display

## 2023-07-18 NOTE — Care Management Important Message (Signed)
Important Message  Patient Details  Name: Chris [name] MRN: [id number] Date of Birth: 11/14/1940   Important Message Given:  Yes - Medicare IM     Dorena Bodo 07/18/2023, 2:13 PM

## 2023-07-18 NOTE — Plan of Care (Signed)
  Problem: Education: Goal: Knowledge of General Education information will improve Description Including pain rating scale, medication(s)/side effects and non-pharmacologic comfort measures Outcome: Progressing   Problem: Health Behavior/Discharge Planning: Goal: Ability to manage health-related needs will improve Outcome: Progressing   

## 2023-07-18 NOTE — Plan of Care (Signed)

## 2023-07-18 NOTE — Progress Notes (Signed)
PROGRESS NOTE    Chris [name]  RUE:[id number] DOB: 20-May-1941 DOA: 07/15/2023 PCP: Hoover Browns, MD   Brief Narrative:  This 82 yrs old male with PMH significant for hypertension, hyperlipidemia, CAD s/p four-vessel CABG, TIA, IDDM, osteomyelitis s/p right second toe amputation, and partial complex seizures who presents after being noted to be acutely altered.  Patient woke up yesterday morning around 1 AM with confusion and has twitching like movements of the left side, new right-sided weakness, staring spells and difficulty communicating.  EMS was called and patient was brought as stroke code. Patient follows up with [facility] neurology since summer for the history of seizures.  He had been on Keppra but due to significant drowsiness he was switched to Vimpat recently.  CT head negative for acute stroke.  CTA head and neck negative for LVO.  Patient is not considered to be a thrombolytic candidate.  Patient became agitated while in the ED and fell from the bed hitting his head and left arm.  Trauma scans were negative for any fracture or dislocation.  EEG showed diffuse slowing but no evidence of seizures.  Neurology had recommended continuous EEG due to concern for status epilepticus.  Patient was loaded with Vimpat and was given Ativan.  Long-term EEG > No evidence of seizures noted.  Assessment & Plan:   Principal Problem:   Seizure (HCC) Active Problems:   Acute metabolic encephalopathy   Fall   Controlled type 2 diabetes mellitus without complication, without long-term current use of insulin (HCC)   AKI (acute kidney injury) (HCC)   Chronic ulcer of left foot (HCC)   Essential hypertension   HLD (hyperlipidemia)   Hypothyroidism (acquired)  Acute metabolic encephalopathy: Suspected status epilepticus: Patient presented with confusion,  twitching like movements of left side, new right-sided weakness, staring spells and difficulty communicating. Initial concern was for  possible stroke. CT and subsequent CTA H/N did not note any acute abnormality or large vessel occlusion.   Patient was not a thrombolytics candidate.  Subsequent MRI of the brain was negative.   EEG showed diffuse slowing with superimposed L focal slowing and overriding beta frequencies. He had been given Ativan IV and subsequently loaded with Vimpat 200 mg IV. Continue Seizure precautions Continue Vimpat, Continue Keppra 1gm twice daily. Appreciate neurology consultative services. EEG does not reveal evidence of seizures or epileptiform discharges. Neurology signed off, recommended to continue vimpat and Keppra.   Gait disturbance: Fall with Scalp laceration: Patient was noted to be unsteady on his feet and had fallen while in the ED prior to transfer,  hitting his head.   CT scan head, maxillofacial, and cervical spine did not note any acute fracture.   After transfer patient noted to have significant bleeding from the scalp laceration which had been previously managed with Steri-Strips. PT/OT to eval and treat General Surgery consulted to repair scalp laceration.  No plans for any scalp laceration repair.  Continue pressure dressing. Bleeding has stopped.   Type 2 diabetes mellitus with hyperglycemia: HbA1c noted to be 6.8 on 10/21.   Patient was discharged on Farxiga 10 mg, insulin75/25 30 units twice daily, and lispro insulin 0 to 15 units 3 times daily with meals. Continue Hypoglycemic protocol. Continue Farxiga CBGs before every meal with sensitive SSI.   Acute kidney injury: > Resolved. Likely prerenal.  Serum creatinine improved with IV hydration. Renal functions back to normal.   Chronic left foot ulcer: Peripheral vascular disease: No signs for infection appreciated on exam.  X-rays did not note any signs for osteomyelitis. Continue aspirin and pentoxifylline   Essential hypertension Continue losartan 25 mg dialy.   Hypothyroidism: Continue levothyroxine.    Hyperlipidemia: Continue atorvastatin.   DVT prophylaxis: SCDs Code Status:  Full code Family Communication: No family at bed side. Disposition Plan:     Status is: Inpatient Remains inpatient appropriate because: Admitted for  CVA , workup, EEG completed. Patient awaiting SNF placement.    Consultants:  Neurology  Procedures:CT H, MRI, EEG  Antimicrobials:  Anti-infectives (From admission, onward)    None      Subjective: Patient was seen and examined at bedside.  Overnight events noted.   Patient reports doing better.He has visible laceration on the frontal scalp,  pressure dressing is applied.  bleeding has stopped.   Objective: Vitals:   07/17/23 0000 07/17/23 0358 07/17/23 1519 07/18/23 0342  BP: (!) 152/62 (!) 145/58 (!) 142/59 (!) 127/56  Pulse: 69 66 69 68  Resp: 16 16 17    Temp: 98.5 F (36.9 C) 98.6 F (37 C) 98 F (36.7 C) 98 F (36.7 C)  TempSrc: Axillary Oral Oral Oral  SpO2: 96% 95% 100% 98%  Weight:      Height:        Intake/Output Summary (Last 24 hours) at 07/18/2023 1202 Last data filed at 07/18/2023 1103 Gross per 24 hour  Intake 307.75 ml  Output 2850 ml  Net -2542.25 ml   Filed Weights   07/16/23 0753  Weight: 92.4 kg    Examination:  General exam: Appears comfortable, calm, Laceration noted on the front of head. Respiratory system: CTA bilaterally. Respiratory effort normal.  RR 13 Cardiovascular system: S1 & S2 heard, RRR. No JVD, murmurs, rubs, gallops or clicks. No pedal edema. Gastrointestinal system: Abdomen is non distended, soft and non tender. Normal bowel sounds heard. Central nervous system: Alert and oriented x3. No focal neurological deficits. Extremities: No edema, no cyanosis, no clubbing Skin: No rashes, lesions or ulcers Psychiatry: Judgement and insight appear normal. Mood & affect appropriate.     Data Reviewed: I have personally reviewed following labs and imaging studies  CBC: Recent Labs  Lab  07/14/23 0320 07/15/23 0607 07/16/23 0436 07/18/23 1020  WBC 12.0* 12.5* 8.7 5.3  NEUTROABS 9.4*  --   --   --   HGB 13.2 11.8* 10.3* 10.2*  HCT 40.3 34.4* 31.5* 31.1*  MCV 94.4 91.5 95.7 93.4  PLT 180 136* 104* 120*   Basic Metabolic Panel: Recent Labs  Lab 07/14/23 0320 07/15/23 0607 07/16/23 0436  NA 136 142 145  K 4.2 4.1 4.2  CL 97* 105 111  CO2 24 26 25   GLUCOSE 315* 250* 170*  BUN 41* 50* 36*  CREATININE 1.43* 1.28* 1.08  CALCIUM 8.9 8.7* 8.6*   GFR: Estimated Creatinine Clearance: 62.4 mL/min (by C-G formula based on SCr of 1.08 mg/dL). Liver Function Tests: Recent Labs  Lab 07/14/23 0320  AST 23  ALT 21  ALKPHOS 109  BILITOT 0.8  PROT 7.6  ALBUMIN 4.2   No results for input(s): "LIPASE", "AMYLASE" in the last 168 hours. No results for input(s): "AMMONIA" in the last 168 hours. Coagulation Profile: Recent Labs  Lab 07/14/23 0320  INR 1.1   Cardiac Enzymes: No results for input(s): "CKTOTAL", "CKMB", "CKMBINDEX", "TROPONINI" in the last 168 hours. BNP (last 3 results) No results for input(s): "PROBNP" in the last 8760 hours. HbA1C: No results for input(s): "HGBA1C" in the last 72 hours.  CBG: Recent  Labs  Lab 07/17/23 1138 07/17/23 1609 07/17/23 2034 07/18/23 0649 07/18/23 1148  GLUCAP 169* 141* 194* 136* 253*   Lipid Profile: No results for input(s): "CHOL", "HDL", "LDLCALC", "TRIG", "CHOLHDL", "LDLDIRECT" in the last 72 hours. Thyroid Function Tests: Recent Labs    07/15/23 2009  TSH 0.294*   Anemia Panel: No results for input(s): "VITAMINB12", "FOLATE", "FERRITIN", "TIBC", "IRON", "RETICCTPCT" in the last 72 hours. Sepsis Labs: No results for input(s): "PROCALCITON", "LATICACIDVEN" in the last 168 hours.  No results found for this or any previous visit (from the past 240 hour(s)).   Radiology Studies: DG Lumbar Spine 2-3 Views  Result Date: 07/17/2023 CLINICAL DATA:  Low back, history of prior lumbar spine surgery EXAM:  LUMBAR SPINE-5 lumbar-type vertebral bodies are presumed.  3 VIEW COMPARISON:  None Available. FINDINGS: Evaluation is limited by osteopenia and overlying bowel gas. There is no evidence of lumbar spine fracture. No significant listhesis. Facet arthropathy, worst in the lower lumbar spine. Degenerative changes in the lumbar spine, with disc height loss most prominent at T12-L1, L4-L5, and L5-S1. Additional degenerative changes in the bilateral sacroiliac joints. IMPRESSION: Evaluation is limited by osteopenia and overlying bowel gas. Within this limitation, no definite acute fracture. Degenerative changes, with disc height loss and facet arthropathy. Electronically Signed   By: Wiliam Ke M.D.   On: 07/17/2023 16:25   Overnight EEG with video  Result Date: 07/17/2023 Charlsie Quest, MD     07/17/2023  2:12 PM Patient Name: Luis Shearer MRN: [id number] Epilepsy Attending: Charlsie Quest Referring Physician/Provider: Rejeana Brock, MD Duration: 07/16/2023 1045 to 07/17/2023 1045 Patient history:  82 y.o. male who presented with recurrent seizures and has had persistent waxing/waning mental status. EEG to evaluate for intermittent focal seizures.  Level of alertness: Awake, asleep AEDs during EEG study: LEV Technical aspects: This EEG study was done with scalp electrodes positioned according to the 10-20 International system of electrode placement. Electrical activity was reviewed with band pass filter of 1-70Hz , sensitivity of 7 uV/mm, display speed of 83mm/sec with a 60Hz  notched filter applied as appropriate. EEG data were recorded continuously and digitally stored.  Video monitoring was available and reviewed as appropriate. Description: The posterior dominant rhythm consists of 8 Hz activity of moderate voltage (25-35 uV) seen predominantly in posterior head regions, symmetric and reactive to eye opening and eye closing. Sleep was characterized by vertex waves, sleep spindles (12 to 14  Hz), maximal frontocentral region. EEG showed continuous generalized 3 to 6 Hz theta-delta slowing. Hyperventilation and photic stimulation were not performed.   ABNORMALITY - Continuous slow, generalized IMPRESSION: This study is suggestive of mild to moderate diffuse encephalopathy. No seizures or epileptiform discharges were seen throughout the recording. Priyanka Annabelle Harman    Scheduled Meds:  aspirin EC  81 mg Oral Daily   atorvastatin  80 mg Oral Daily   dapagliflozin propanediol  10 mg Oral Daily   influenza vaccine adjuvanted  0.5 mL Intramuscular Tomorrow-1000   insulin aspart  0-5 Units Subcutaneous QHS   insulin aspart  0-9 Units Subcutaneous TID WC   insulin aspart protamine- aspart  30 Units Subcutaneous BID WC   lacosamide  200 mg Oral BID   levETIRAcetam  500 mg Oral BID   levothyroxine  88 mcg Oral Q0600   losartan  25 mg Oral Daily   pentoxifylline  400 mg Oral BID   polyethylene glycol  17 g Oral Daily   senna-docusate  1 tablet Oral BID  sodium chloride flush  3 mL Intravenous Q12H   Continuous Infusions:  sodium chloride 100 mL/hr at 07/17/23 1747     LOS: 3 days    Time spent: 35 mins    Willeen Niece, MD [facility]   If 7PM-7AM, please contact night-coverage

## 2023-07-19 DIAGNOSIS — R569 Unspecified convulsions: Secondary | ICD-10-CM | POA: Diagnosis not present

## 2023-07-19 LAB — GLUCOSE, CAPILLARY
Glucose-Capillary: 64 mg/dL — ABNORMAL LOW (ref 70–99)
Glucose-Capillary: 98 mg/dL (ref 70–99)
Glucose-Capillary: 194 mg/dL — ABNORMAL HIGH (ref 70–99)
Glucose-Capillary: 111 mg/dL — ABNORMAL HIGH (ref 70–99)
Glucose-Capillary: 109 mg/dL — ABNORMAL HIGH (ref 70–99)

## 2023-07-19 NOTE — Plan of Care (Signed)
  Problem: Education: Goal: Knowledge of General Education information will improve Description: Including pain rating scale, medication(s)/side effects and non-pharmacologic comfort measures Outcome: Progressing   Problem: Health Behavior/Discharge Planning: Goal: Ability to manage health-related needs will improve Outcome: Progressing   Problem: Clinical Measurements: Goal: Ability to maintain clinical measurements within normal limits will improve Outcome: Progressing Goal: Will remain free from infection Outcome: Progressing Goal: Diagnostic test results will improve Outcome: Progressing Goal: Respiratory complications will improve Outcome: Progressing Goal: Cardiovascular complication will be avoided Outcome: Progressing   Problem: Nutrition: Goal: Adequate nutrition will be maintained Outcome: Progressing   Problem: Coping: Goal: Level of anxiety will decrease Outcome: Progressing   Problem: Elimination: Goal: Will not experience complications related to urinary retention Outcome: Progressing   Problem: Safety: Goal: Ability to remain free from injury will improve Outcome: Progressing   Problem: Skin Integrity: Goal: Risk for impaired skin integrity will decrease Outcome: Progressing   Problem: Education: Goal: Ability to describe self-care measures that may prevent or decrease complications (Diabetes Survival Skills Education) will improve Outcome: Progressing Goal: Individualized Educational Video(s) Outcome: Progressing   Problem: Coping: Goal: Ability to adjust to condition or change in health will improve Outcome: Progressing   Problem: Fluid Volume: Goal: Ability to maintain a balanced intake and output will improve Outcome: Progressing   Problem: Health Behavior/Discharge Planning: Goal: Ability to identify and utilize available resources and services will improve Outcome: Progressing Goal: Ability to manage health-related needs will  improve Outcome: Progressing   Problem: Metabolic: Goal: Ability to maintain appropriate glucose levels will improve Outcome: Progressing   Problem: Nutritional: Goal: Maintenance of adequate nutrition will improve Outcome: Progressing Goal: Progress toward achieving an optimal weight will improve Outcome: Progressing   Problem: Skin Integrity: Goal: Risk for impaired skin integrity will decrease Outcome: Progressing   Problem: Tissue Perfusion: Goal: Adequacy of tissue perfusion will improve Outcome: Progressing

## 2023-07-19 NOTE — Progress Notes (Signed)
Patient's head re-wrapped with gauze. Has gauze pressure dressing covered with tegaderm in place. Patient still having some scant sanguinous drainage despite pressure dressing.

## 2023-07-19 NOTE — Progress Notes (Signed)
Cleansed forehead laceration with sterile saline and changed dressing with Juliette Alcide RN, petroleum gauze applied with mepilex over top, and reinforced with tape. Minimal sanguinous drainage.

## 2023-07-19 NOTE — Progress Notes (Signed)
PROGRESS NOTE    Chris [name]  ZOX:[id number] DOB: 18-Mar-1941 DOA: 07/15/2023 PCP: Hoover Browns, MD   Brief Narrative:  This 82 yrs old male with PMH significant for hypertension, hyperlipidemia, CAD s/p four-vessel CABG, TIA, IDDM, osteomyelitis s/p right second toe amputation, and partial complex seizures who presents after being noted to be acutely altered.  Patient woke up yesterday morning around 1 AM with confusion and has twitching like movements of the left side, new right-sided weakness, staring spells and difficulty communicating.  EMS was called and patient was brought as stroke code. Patient follows up with [facility] neurology since summer for the history of seizures.  He had been on Keppra but due to significant drowsiness he was switched to Vimpat recently.  CT head negative for acute stroke.  CTA head and neck negative for LVO.  Patient is not considered to be a thrombolytic candidate.  Patient became agitated while in the ED and fell from the bed hitting his head and left arm.  Trauma scans were negative for any fracture or dislocation.  EEG showed diffuse slowing but no evidence of seizures.  Neurology had recommended continuous EEG due to concern for status epilepticus.  Patient was loaded with Vimpat and was given Ativan.  Long-term EEG > No evidence of seizures noted.  Assessment & Plan:   Principal Problem:   Seizure (HCC) Active Problems:   Acute metabolic encephalopathy   Fall   Controlled type 2 diabetes mellitus without complication, without long-term current use of insulin (HCC)   AKI (acute kidney injury) (HCC)   Chronic ulcer of left foot (HCC)   Essential hypertension   HLD (hyperlipidemia)   Hypothyroidism (acquired)  Acute metabolic encephalopathy: Suspected status epilepticus: Patient presented with confusion,  twitching like movements of left side, new right-sided weakness, staring spells and difficulty communicating. Initial concern was for  possible stroke. CT and subsequent CTA H/N did not note any acute abnormality or large vessel occlusion.   Patient was not a thrombolytics candidate.Subsequent MRI of the brain was negative.   EEG showed diffuse slowing with superimposed L focal slowing and overriding beta frequencies. He had been given Ativan IV and subsequently loaded with Vimpat 200 mg IV. Continue Seizure precautions Continue Vimpat, Continue Keppra 1gm twice daily. Appreciate neurology consultative services. EEG does not reveal evidence of seizures or epileptiform discharges. Neurology signed off, recommended to continue vimpat and Keppra.   Gait disturbance: Fall with Scalp laceration: Patient was noted to be unsteady on his feet and had fallen while in the ED prior to transfer,  hitting his head.   CT scan head, maxillofacial, and cervical spine did not note any acute fracture.   After transfer patient noted to have significant bleeding from the scalp laceration which had been previously managed with Steri-Strips. PT/OT to eval and treat General Surgery consulted to repair scalp laceration.  No plans for any scalp laceration repair.  Continue pressure dressing. Bleeding has stopped.   Type 2 diabetes mellitus with hyperglycemia: HbA1c noted to be 6.8 on 10/21.   Patient was discharged on Farxiga 10 mg, insulin75/25 30 units twice daily, and lispro insulin 0 to 15 units 3 times daily with meals. Continue Hypoglycemic protocol. Continue Farxiga CBGs before every meal with sensitive SSI.  Acute kidney injury: > Resolved. Likely prerenal.  Serum creatinine improved with IV hydration. Renal functions back to normal.   Chronic left foot ulcer: Peripheral vascular disease: No signs for infection appreciated on exam.   X-rays did  not note any signs for osteomyelitis. Continue aspirin and pentoxifylline   Essential hypertension Continue losartan 25 mg dialy.   Hypothyroidism: Continue levothyroxine.    Hyperlipidemia: Continue atorvastatin.   DVT prophylaxis: SCDs Code Status:  Full code Family Communication: No family at bed side. Disposition Plan:     Status is: Inpatient Remains inpatient appropriate because: Admitted for CVA workup, EEG completed. Patient awaiting SNF placement.    Consultants:  Neurology  Procedures:CT H, MRI, EEG  Antimicrobials:  Anti-infectives (From admission, onward)    None      Subjective: Patient was seen and examined at bedside.  Overnight events noted.   Patient reports doing better. He has visible laceration on the frontal scalp,  pressure dressing is applied.   Bleeding has stopped.  Patient is medically clear,  awaiting SNF placement.   Objective: Vitals:   07/17/23 1519 07/18/23 0342 07/19/23 0751 07/19/23 1120  BP: (!) 142/59 (!) 127/56 (!) 134/55 101/72  Pulse: 69 68 67 69  Resp: 17  20 17   Temp: 98 F (36.7 C) 98 F (36.7 C) 98.2 F (36.8 C) 98.4 F (36.9 C)  TempSrc: Oral Oral Oral Oral  SpO2: 100% 98% 99% 97%  Weight:      Height:        Intake/Output Summary (Last 24 hours) at 07/19/2023 1157 Last data filed at 07/19/2023 0900 Gross per 24 hour  Intake 800 ml  Output 1450 ml  Net -650 ml   Filed Weights   07/16/23 0753  Weight: 92.4 kg    Examination:  General exam: Appears comfortable, calm, Laceration noted on the front of head. NAD Respiratory system: CTA bilaterally. Respiratory effort normal.  RR 14 Cardiovascular system: S1 & S2 heard, RRR. No JVD, murmurs, rubs, gallops or clicks. No pedal edema. Gastrointestinal system: Abdomen is non distended, soft and non tender. Normal bowel sounds heard. Central nervous system: Alert and oriented x3. No focal neurological deficits. Extremities: No edema, no cyanosis, no clubbing Skin: No rashes, lesions or ulcers Psychiatry: Judgement and insight appear normal. Mood & affect appropriate.     Data Reviewed: I have personally reviewed following labs and  imaging studies  CBC: Recent Labs  Lab 07/14/23 0320 07/15/23 0607 07/16/23 0436 07/18/23 1020  WBC 12.0* 12.5* 8.7 5.3  NEUTROABS 9.4*  --   --   --   HGB 13.2 11.8* 10.3* 10.2*  HCT 40.3 34.4* 31.5* 31.1*  MCV 94.4 91.5 95.7 93.4  PLT 180 136* 104* 120*   Basic Metabolic Panel: Recent Labs  Lab 07/14/23 0320 07/15/23 0607 07/16/23 0436  NA 136 142 145  K 4.2 4.1 4.2  CL 97* 105 111  CO2 24 26 25   GLUCOSE 315* 250* 170*  BUN 41* 50* 36*  CREATININE 1.43* 1.28* 1.08  CALCIUM 8.9 8.7* 8.6*   GFR: Estimated Creatinine Clearance: 62.4 mL/min (by C-G formula based on SCr of 1.08 mg/dL). Liver Function Tests: Recent Labs  Lab 07/14/23 0320  AST 23  ALT 21  ALKPHOS 109  BILITOT 0.8  PROT 7.6  ALBUMIN 4.2   No results for input(s): "LIPASE", "AMYLASE" in the last 168 hours. No results for input(s): "AMMONIA" in the last 168 hours. Coagulation Profile: Recent Labs  Lab 07/14/23 0320  INR 1.1   Cardiac Enzymes: No results for input(s): "CKTOTAL", "CKMB", "CKMBINDEX", "TROPONINI" in the last 168 hours. BNP (last 3 results) No results for input(s): "PROBNP" in the last 8760 hours. HbA1C: No results for input(s): "HGBA1C" in  the last 72 hours.  CBG: Recent Labs  Lab 07/18/23 1608 07/18/23 2113 07/19/23 0630 07/19/23 0647 07/19/23 1120  GLUCAP 195* 111* 64* 98 194*   Lipid Profile: No results for input(s): "CHOL", "HDL", "LDLCALC", "TRIG", "CHOLHDL", "LDLDIRECT" in the last 72 hours. Thyroid Function Tests: No results for input(s): "TSH", "T4TOTAL", "FREET4", "T3FREE", "THYROIDAB" in the last 72 hours.  Anemia Panel: No results for input(s): "VITAMINB12", "FOLATE", "FERRITIN", "TIBC", "IRON", "RETICCTPCT" in the last 72 hours. Sepsis Labs: No results for input(s): "PROCALCITON", "LATICACIDVEN" in the last 168 hours.  No results found for this or any previous visit (from the past 240 hour(s)).   Radiology Studies: DG Lumbar Spine 2-3 Views  Result  Date: 07/17/2023 CLINICAL DATA:  Low back, history of prior lumbar spine surgery EXAM: LUMBAR SPINE-5 lumbar-type vertebral bodies are presumed.  3 VIEW COMPARISON:  None Available. FINDINGS: Evaluation is limited by osteopenia and overlying bowel gas. There is no evidence of lumbar spine fracture. No significant listhesis. Facet arthropathy, worst in the lower lumbar spine. Degenerative changes in the lumbar spine, with disc height loss most prominent at T12-L1, L4-L5, and L5-S1. Additional degenerative changes in the bilateral sacroiliac joints. IMPRESSION: Evaluation is limited by osteopenia and overlying bowel gas. Within this limitation, no definite acute fracture. Degenerative changes, with disc height loss and facet arthropathy. Electronically Signed   By: Wiliam Ke M.D.   On: 07/17/2023 16:25    Scheduled Meds:  aspirin EC  81 mg Oral Daily   atorvastatin  80 mg Oral Daily   dapagliflozin propanediol  10 mg Oral Daily   influenza vaccine adjuvanted  0.5 mL Intramuscular Tomorrow-1000   insulin aspart  0-5 Units Subcutaneous QHS   insulin aspart  0-9 Units Subcutaneous TID WC   insulin aspart protamine- aspart  30 Units Subcutaneous BID WC   lacosamide  200 mg Oral BID   levETIRAcetam  500 mg Oral BID   levothyroxine  88 mcg Oral Q0600   losartan  25 mg Oral Daily   pentoxifylline  400 mg Oral BID   polyethylene glycol  17 g Oral Daily   senna-docusate  1 tablet Oral BID   sodium chloride flush  3 mL Intravenous Q12H   Continuous Infusions:  sodium chloride 100 mL/hr at 07/19/23 0257     LOS: 4 days    Time spent: 35 mins    Willeen Niece, MD [facility]   If 7PM-7AM, please contact night-coverage

## 2023-07-19 NOTE — Plan of Care (Signed)

## 2023-07-20 DIAGNOSIS — R569 Unspecified convulsions: Secondary | ICD-10-CM | POA: Diagnosis not present

## 2023-07-20 LAB — GLUCOSE, CAPILLARY
Glucose-Capillary: 56 mg/dL — ABNORMAL LOW (ref 70–99)
Glucose-Capillary: 78 mg/dL (ref 70–99)
Glucose-Capillary: 137 mg/dL — ABNORMAL HIGH (ref 70–99)
Glucose-Capillary: 137 mg/dL — ABNORMAL HIGH (ref 70–99)
Glucose-Capillary: 125 mg/dL — ABNORMAL HIGH (ref 70–99)

## 2023-07-20 NOTE — Progress Notes (Signed)
PROGRESS NOTE    Chris [name]  WGN:[id number] DOB: [birth year] DOA: 07/15/2023 PCP: Hoover Browns, MD   Brief Narrative:  This 82 yrs old male with PMH significant for hypertension, hyperlipidemia, CAD s/p four-vessel CABG, TIA, IDDM, osteomyelitis s/p right second toe amputation, and partial complex seizures who presents after being noted to be acutely altered.  Patient woke up yesterday morning around 1 AM with confusion and has twitching like movements of the left side, new right-sided weakness, staring spells and difficulty communicating.  EMS was called and patient was brought as stroke code. Patient follows up with [facility] neurology since summer for the history of seizures.  He had been on Keppra but due to significant drowsiness he was switched to Vimpat recently.  CT head negative for acute stroke.  CTA head and neck negative for LVO.  Patient is not considered to be a thrombolytic candidate.  Patient became agitated while in the ED and fell from the bed hitting his head and left arm.  Trauma scans were negative for any fracture or dislocation.  EEG showed diffuse slowing but no evidence of seizures.  Neurology had recommended continuous EEG due to concern for status epilepticus.  Patient was loaded with Vimpat and was given Ativan.  Long-term EEG > No evidence of seizures noted.  Assessment & Plan:   Principal Problem:   Seizure (HCC) Active Problems:   Acute metabolic encephalopathy   Fall   Controlled type 2 diabetes mellitus without complication, without long-term current use of insulin (HCC)   AKI (acute kidney injury) (HCC)   Chronic ulcer of left foot (HCC)   Essential hypertension   HLD (hyperlipidemia)   Hypothyroidism (acquired)  Acute metabolic encephalopathy: Suspected status epilepticus: Patient presented with confusion,  twitching like movements of left side, new right-sided weakness, staring spells and difficulty communicating. Initial concern was for  possible stroke. CT and subsequent CTA H/N did not note any acute abnormality or large vessel occlusion.   Patient was not a thrombolytics candidate.Subsequent MRI of the brain was negative.   EEG showed diffuse slowing with superimposed L focal slowing and overriding beta frequencies. He had been given Ativan IV and subsequently loaded with Vimpat 200 mg IV. Continue Seizure precautions Continue Vimpat, Continue Keppra 1gm twice daily. Appreciate neurology consultative services. LTM EEG does not reveal evidence of seizures or epileptiform discharges. Neurology signed off, recommended to continue vimpat and Keppra.   Gait disturbance: Fall with Scalp laceration: Patient was noted to be unsteady on his feet and had fallen while in the ED prior to transfer,  hitting his head.   CT scan head, maxillofacial, and cervical spine did not note any acute fracture.   After transfer patient noted to have significant bleeding from the scalp laceration which had been previously managed with Steri-Strips. PT/OT to eval and treat General Surgery consulted to repair scalp laceration.  No plans for any scalp laceration repair.   Continue pressure dressing. Bleeding has stopped.   Type 2 diabetes mellitus with hyperglycemia: HbA1c noted to be 6.8 on 10/21.   Patient was discharged on Farxiga 10 mg, insulin75/25 30 units twice daily, and lispro insulin 0 to 15 units 3 times daily with meals. Continue Hypoglycemic protocol. Continue Farxiga CBGs before every meal with sensitive SSI.  Acute kidney injury: > Resolved. Likely prerenal.  Serum creatinine improved with IV hydration. Renal functions back to normal.   Chronic left foot ulcer: Peripheral vascular disease: No signs for infection appreciated on exam.  X-rays did not note any signs for osteomyelitis. Continue aspirin and pentoxifylline   Essential hypertension Continue losartan 25 mg dialy.   Hypothyroidism: Continue levothyroxine.    Hyperlipidemia: Continue atorvastatin.   DVT prophylaxis: SCDs Code Status:  Full code Family Communication: No family at bed side. Disposition Plan:     Status is: Inpatient Remains inpatient appropriate because: Admitted for CVA workup, EEG completed. Patient awaiting SNF placement.    Consultants:  Neurology  Procedures:CT H, MRI, EEG  Antimicrobials:  Anti-infectives (From admission, onward)    None      Subjective: Patient was seen and examined at bedside.  Overnight events noted.   Patient reports doing better. He has visible laceration on the frontal scalp,  pressure dressing is applied.   Bleeding has stopped.  Patient is medically clear,  awaiting SNF placement.   Objective: Vitals:   07/19/23 2325 07/20/23 0345 07/20/23 0739 07/20/23 1109  BP: (!) 140/59 (!) 125/58 137/68 (!) 156/72  Pulse: 71 67 69 75  Resp: 18 18 19 17   Temp: 98 F (36.7 C) (!) 97.5 F (36.4 C) 97.9 F (36.6 C)   TempSrc: Oral Oral Oral   SpO2: 100% 100% 99% 100%  Weight:      Height:        Intake/Output Summary (Last 24 hours) at 07/20/2023 1113 Last data filed at 07/20/2023 1000 Gross per 24 hour  Intake 2349.71 ml  Output 2300 ml  Net 49.71 ml   Filed Weights   07/16/23 0753  Weight: 92.4 kg    Examination:  General exam: Appears comfortable, laceration noted on the front of head. NAD Respiratory system: CTA bilaterally. Respiratory effort normal.  RR 16 Cardiovascular system: S1 & S2 heard, RRR. No JVD, murmurs, rubs, gallops or clicks. No pedal edema. Gastrointestinal system: Abdomen is non distended, soft and non tender. Normal bowel sounds heard. Central nervous system: Alert and oriented x3. No focal neurological deficits. Extremities: No edema, no cyanosis, no clubbing Skin: No rashes, lesions or ulcers Psychiatry: Judgement and insight appear normal. Mood & affect appropriate.     Data Reviewed: I have personally reviewed following labs and imaging  studies  CBC: Recent Labs  Lab 07/14/23 0320 07/15/23 0607 07/16/23 0436 07/18/23 1020  WBC 12.0* 12.5* 8.7 5.3  NEUTROABS 9.4*  --   --   --   HGB 13.2 11.8* 10.3* 10.2*  HCT 40.3 34.4* 31.5* 31.1*  MCV 94.4 91.5 95.7 93.4  PLT 180 136* 104* 120*   Basic Metabolic Panel: Recent Labs  Lab 07/14/23 0320 07/15/23 0607 07/16/23 0436  NA 136 142 145  K 4.2 4.1 4.2  CL 97* 105 111  CO2 24 26 25   GLUCOSE 315* 250* 170*  BUN 41* 50* 36*  CREATININE 1.43* 1.28* 1.08  CALCIUM 8.9 8.7* 8.6*   GFR: Estimated Creatinine Clearance: 62.4 mL/min (by C-G formula based on SCr of 1.08 mg/dL). Liver Function Tests: Recent Labs  Lab 07/14/23 0320  AST 23  ALT 21  ALKPHOS 109  BILITOT 0.8  PROT 7.6  ALBUMIN 4.2   No results for input(s): "LIPASE", "AMYLASE" in the last 168 hours. No results for input(s): "AMMONIA" in the last 168 hours. Coagulation Profile: Recent Labs  Lab 07/14/23 0320  INR 1.1   Cardiac Enzymes: No results for input(s): "CKTOTAL", "CKMB", "CKMBINDEX", "TROPONINI" in the last 168 hours. BNP (last 3 results) No results for input(s): "PROBNP" in the last 8760 hours. HbA1C: No results for input(s): "HGBA1C" in the  last 72 hours.  CBG: Recent Labs  Lab 07/19/23 1120 07/19/23 1615 07/19/23 2105 07/20/23 0606 07/20/23 0625  GLUCAP 194* 111* 109* 56* 78   Lipid Profile: No results for input(s): "CHOL", "HDL", "LDLCALC", "TRIG", "CHOLHDL", "LDLDIRECT" in the last 72 hours. Thyroid Function Tests: No results for input(s): "TSH", "T4TOTAL", "FREET4", "T3FREE", "THYROIDAB" in the last 72 hours.  Anemia Panel: No results for input(s): "VITAMINB12", "FOLATE", "FERRITIN", "TIBC", "IRON", "RETICCTPCT" in the last 72 hours. Sepsis Labs: No results for input(s): "PROCALCITON", "LATICACIDVEN" in the last 168 hours.  No results found for this or any previous visit (from the past 240 hour(s)).   Radiology Studies: No results found.  Scheduled Meds:   aspirin EC  81 mg Oral Daily   atorvastatin  80 mg Oral Daily   dapagliflozin propanediol  10 mg Oral Daily   influenza vaccine adjuvanted  0.5 mL Intramuscular Tomorrow-1000   insulin aspart  0-5 Units Subcutaneous QHS   insulin aspart  0-9 Units Subcutaneous TID WC   insulin aspart protamine- aspart  30 Units Subcutaneous BID WC   lacosamide  200 mg Oral BID   levETIRAcetam  500 mg Oral BID   levothyroxine  88 mcg Oral Q0600   losartan  25 mg Oral Daily   pentoxifylline  400 mg Oral BID   polyethylene glycol  17 g Oral Daily   senna-docusate  1 tablet Oral BID   sodium chloride flush  3 mL Intravenous Q12H   Continuous Infusions:     LOS: 5 days    Time spent: 35 mins    Willeen Niece, MD [facility]   If 7PM-7AM, please contact night-coverage

## 2023-07-20 NOTE — Plan of Care (Signed)

## 2023-07-21 DIAGNOSIS — R569 Unspecified convulsions: Secondary | ICD-10-CM | POA: Diagnosis not present

## 2023-07-21 LAB — CBC
HCT: 33.6 % — ABNORMAL LOW (ref 39.0–52.0)
Hemoglobin: 11 g/dL — ABNORMAL LOW (ref 13.0–17.0)
MCH: 31.1 pg (ref 26.0–34.0)
MCHC: 32.7 g/dL (ref 30.0–36.0)
MCV: 94.9 fL (ref 80.0–100.0)
Platelets: 174 10*3/uL (ref 150–400)
RBC: 3.54 MIL/uL — ABNORMAL LOW (ref 4.22–5.81)
RDW: 12.7 % (ref 11.5–15.5)
WBC: 7.5 10*3/uL (ref 4.0–10.5)
nRBC: 0 % (ref 0.0–0.2)

## 2023-07-21 LAB — BASIC METABOLIC PANEL
Anion gap: 8 (ref 5–15)
BUN: 17 mg/dL (ref 8–23)
CO2: 27 mmol/L (ref 22–32)
Calcium: 8.5 mg/dL — ABNORMAL LOW (ref 8.9–10.3)
Chloride: 101 mmol/L (ref 98–111)
Creatinine, Ser: 1.04 mg/dL (ref 0.61–1.24)
GFR, Estimated: >60 mL/min (ref >60)
Glucose, Bld: 141 mg/dL — ABNORMAL HIGH (ref 70–99)
Potassium: 5.1 mmol/L (ref 3.5–5.1)
Sodium: 136 mmol/L (ref 135–145)

## 2023-07-21 LAB — GLUCOSE, CAPILLARY
Glucose-Capillary: 135 mg/dL — ABNORMAL HIGH (ref 70–99)
Glucose-Capillary: 242 mg/dL — ABNORMAL HIGH (ref 70–99)
Glucose-Capillary: 128 mg/dL — ABNORMAL HIGH (ref 70–99)
Glucose-Capillary: 124 mg/dL — ABNORMAL HIGH (ref 70–99)
Glucose-Capillary: 125 mg/dL — ABNORMAL HIGH (ref 70–99)

## 2023-07-21 LAB — MAGNESIUM: Magnesium: 2.2 mg/dL (ref 1.7–2.4)

## 2023-07-21 LAB — PHOSPHORUS: Phosphorus: 3.7 mg/dL (ref 2.5–4.6)

## 2023-07-21 MED ORDER — OXYCODONE-ACETAMINOPHEN 5-325 MG PO TABS: 1.0000 | ORAL_TABLET | Freq: Four times a day (QID) | ORAL | 0 refills | Status: AC | PRN

## 2023-07-21 MED ORDER — CYCLOBENZAPRINE HCL 10 MG PO TABS: 10.0000 mg | ORAL_TABLET | Freq: Three times a day (TID) | ORAL | 0 refills | Status: DC | PRN

## 2023-07-21 MED ORDER — CYCLOBENZAPRINE HCL 10 MG PO TABS: 10.0000 mg | ORAL_TABLET | Freq: Three times a day (TID) | ORAL | 0 refills | Status: AC | PRN

## 2023-07-21 MED ORDER — LOSARTAN POTASSIUM 25 MG PO TABS: 25.0000 mg | ORAL_TABLET | Freq: Every day | ORAL | 0 refills | Status: AC

## 2023-07-21 MED ORDER — LOSARTAN POTASSIUM 25 MG PO TABS: 25.0000 mg | ORAL_TABLET | Freq: Every day | ORAL | 0 refills | Status: DC

## 2023-07-21 NOTE — Discharge Summary (Signed)
Physician Discharge Summary  Tho Radice NUU:[id number] DOB: [birth year] DOA: 07/15/2023  PCP: Hoover Browns, MD  Admit date: 07/15/2023  Discharge date: 07/21/2023  Admitted From: Home  Disposition:  SNF  Recommendations for Outpatient Follow-up:  Follow up with PCP in 1-2 weeks. Please obtain BMP/CBC in one week. Advised to follow-up with Neurology in 8 weeks. Advised to take Keppra and Vimpat for seizure disorder.  Home Health:None Equipment/Devices:None  Discharge Condition: Stable CODE STATUS:Full code Diet recommendation: Heart Healthy   Brief [facility] Course: This 82 yrs old male with PMH significant for hypertension, hyperlipidemia, CAD s/p four-vessel CABG, TIA, IDDM, hx. of osteomyelitis s/p right second toe amputation, and partial complex seizures who presents after being noted to be acutely altered.  Patient woke up in the morning around 1 AM with confusion and has twitching like movements of the left side, new right-sided weakness, staring spells and difficulty communicating.  EMS was called and patient was brought as stroke code. Patient follows up with [facility] neurology since summer for the history of seizures.  He had been on Keppra but due to significant drowsiness he was switched to Vimpat recently.CT head negative for acute stroke.CTA head and neck negative for LVO.  Patient is not considered to be a thrombolytic candidate.  Patient became agitated while in the ED and fell from the bed,  hitting his head and left arm. Trauma scans were negative for any fracture or dislocation.  EEG showed diffuse slowing but no evidence of seizures.Neurology had recommended continuous EEG due to concern for status epilepticus.  Patient was loaded with Vimpat and was given Ativan.  Long-term EEG > No evidence of seizures noted. Neurology recommended to continue Keppra and Vimpat.  Patient was also evaluated by general surgery for bleeding from laceration on the frontal  scalp. General surgery said no need for staples or stitches however recommended pressure dressing.  Bleeding has stopped.  Patient feels better,  wants to be discharged.  Insurance authorization approved.  Patient being discharged to skilled nursing facility for rehab.   Discharge Diagnoses:  Principal Problem:   Seizure [facility]) Active Problems:   Acute metabolic encephalopathy   Fall   Controlled type 2 diabetes mellitus without complication, without long-term current use of insulin (HCC)   AKI (acute kidney injury) (HCC)   Chronic ulcer of left foot (HCC)   Essential hypertension   HLD (hyperlipidemia)   Hypothyroidism (acquired)  Acute metabolic encephalopathy: > Resolved. Suspected status epilepticus: Patient presented with confusion,  twitching like movements of left side, new right-sided weakness, staring spells and difficulty communicating. Initial concern was for possible stroke. CT and subsequent CTA H/N did not note any acute abnormality or large vessel occlusion.   Patient was not a thrombolytics candidate. Subsequent MRI of the brain was negative.   EEG showed diffuse slowing with superimposed L focal slowing and overriding beta frequencies. He had been given Ativan IV and subsequently loaded with Vimpat 200 mg IV. Continue Seizure precautions Continue Vimpat, Continue Keppra 1gm twice daily. Appreciate neurology consultative services. LTM EEG does not reveal evidence of seizures or epileptiform discharges. Neurology signed off, recommended to continue vimpat and Keppra.   Gait disturbance: Fall with Scalp laceration: Patient was noted to be unsteady on his feet and had fallen while in the ED prior to transfer,  hitting his head.   CT scan head, maxillofacial, and cervical spine did not note any acute fracture.   After transfer patient noted to have significant bleeding from  the scalp laceration which had been previously managed with Steri-Strips. PT/OT to eval and  treat. General Surgery consulted to repair scalp laceration.  No plans for any scalp laceration repair.   Continue pressure dressing. Bleeding has stopped.   Type 2 diabetes mellitus with hyperglycemia: HbA1c noted to be 6.8 on 10/21.   Patient was discharged on Farxiga 10 mg, insulin75/25 30 units twice daily, and lispro insulin 0 to 15 units 3 times daily with meals. Continue Hypoglycemic protocol. Continue Farxiga CBGs before every meal with sensitive SSI.   Acute kidney injury: > Resolved. Likely prerenal.  Serum creatinine improved with IV hydration. Renal functions back to normal.   Chronic left foot ulcer: Peripheral vascular disease: No signs for infection appreciated on exam.   X-rays did not note any signs for osteomyelitis. Continue aspirin and pentoxifylline   Essential hypertension Continue losartan 25 mg dialy.   Hypothyroidism: Continue levothyroxine.   Hyperlipidemia: Continue atorvastatin.    Discharge Instructions  Discharge Instructions     Call MD for:  difficulty breathing, headache or visual disturbances   Complete by: As directed    Call MD for:  persistant dizziness or light-headedness   Complete by: As directed    Call MD for:  persistant nausea and vomiting   Complete by: As directed    Diet - low sodium heart healthy   Complete by: As directed    Diet Carb Modified   Complete by: As directed    Discharge instructions   Complete by: As directed    Advised to follow-up with primary care physician in 1 week. Advised to follow-up with neurology in 8 weeks. Advised to take Keppra and Vimpat for seizure disorder.   Discharge wound care:   Complete by: As directed    Follow up wound care in SNF   Increase activity slowly   Complete by: As directed       Allergies as of 07/21/2023       Reactions   Lisinopril Other (See Comments)   HyperK   No Known Allergies         Medication List     STOP taking these medications     co-enzyme Q-10 30 MG capsule       TAKE these medications    aspirin EC 81 MG tablet Take 81 mg by mouth daily.   atorvastatin 80 MG tablet Commonly known as: LIPITOR Take 80 mg by mouth daily.   cetirizine 10 MG tablet Commonly known as: ZYRTEC Take 10 mg by mouth daily as needed for allergies.   cyclobenzaprine 10 MG tablet Commonly known as: FLEXERIL Take 1 tablet (10 mg total) by mouth 3 (three) times daily as needed for muscle spasms.   Farxiga 10 MG Tabs tablet Generic drug: dapagliflozin propanediol Take 10 mg by mouth daily.   furosemide 40 MG tablet Commonly known as: LASIX TAKE 1 TABLET(40 MG) BY MOUTH EVERY DAY   gabapentin 300 MG capsule Commonly known as: NEURONTIN Take 3 capsules by mouth 2 (two) times daily.   Iron 325 (65 Fe) MG Tabs Take 1 tablet by mouth daily.   lacosamide 200 MG Tabs tablet Commonly known as: VIMPAT Take 1 tablet (200 mg total) by mouth 2 (two) times daily.   levETIRAcetam 250 MG tablet Commonly known as: KEPPRA Take 250 mg by mouth 2 (two) times daily.   levothyroxine 88 MCG tablet Commonly known as: SYNTHROID TAKE 1 TABLET BY MOUTH DAILY AT 6 AM   lidocaine 5 %  ointment Commonly known as: XYLOCAINE Apply 1 application topically as needed.   losartan 25 MG tablet Commonly known as: COZAAR Take 1 tablet (25 mg total) by mouth daily. Start taking on: July 22, 2023   oxyCODONE-acetaminophen 5-325 MG tablet Commonly known as: PERCOCET/ROXICET Take 1 tablet by mouth every 6 (six) hours as needed for up to 3 days for severe pain (pain score 7-10).   Ozempic (0.25 or 0.5 MG/DOSE) 2 MG/1.5ML Sopn Generic drug: Semaglutide(0.25 or 0.5MG /DOS) Inject 0.25 mg into the skin once a week.   pentoxifylline 400 MG CR tablet Commonly known as: TRENTAL TAKE 1 TABLET(400 MG) BY MOUTH TWICE DAILY               Discharge Care Instructions  (From admission, onward)           Start     Ordered   07/21/23 0000   Discharge wound care:       Comments: Follow up wound care in SNF   07/21/23 1259            Follow-up Information     Hoover Browns, MD Follow up in 1 week(s).   Specialty: Endocrinology Contact information: [address] [place] [address] [phone]         [facility] Follow up in 6 week(s).   Contact information: [address] [place] [address] [phone]                Allergies  Allergen Reactions   Lisinopril Other (See Comments)    HyperK   No Known Allergies     Consultations: Neurology   Procedures/Studies: DG Lumbar Spine 2-3 Views  Result Date: 07/17/2023 CLINICAL DATA:  Low back, history of prior lumbar spine surgery EXAM: LUMBAR SPINE-5 lumbar-type vertebral bodies are presumed.  3 VIEW COMPARISON:  None Available. FINDINGS: Evaluation is limited by osteopenia and overlying bowel gas. There is no evidence of lumbar spine fracture. No significant listhesis. Facet arthropathy, worst in the lower lumbar spine. Degenerative changes in the lumbar spine, with disc height loss most prominent at T12-L1, L4-L5, and L5-S1. Additional degenerative changes in the bilateral sacroiliac joints. IMPRESSION: Evaluation is limited by osteopenia and overlying bowel gas. Within this limitation, no definite acute fracture. Degenerative changes, with disc height loss and facet arthropathy. Electronically Signed   By: Wiliam Ke M.D.   On: 07/17/2023 16:25   Overnight EEG with video  Result Date: 07/17/2023 Charlsie Quest, MD     07/17/2023  2:12 PM Patient Name: Freddie Rumley MRN: [id number] Epilepsy Attending: Charlsie Quest Referring Physician/Provider: Rejeana Brock, MD Duration: 07/16/2023 1045 to 07/17/2023 1045 Patient history:  82 y.o. male who presented with recurrent seizures and has had persistent waxing/waning mental status. EEG to evaluate for intermittent focal seizures.   Level of alertness: Awake, asleep AEDs during EEG study: LEV Technical aspects: This EEG study was done with scalp electrodes positioned according to the 10-20 International system of electrode placement. Electrical activity was reviewed with band pass filter of 1-70Hz , sensitivity of 7 uV/mm, display speed of 51mm/sec with a 60Hz  notched filter applied as appropriate. EEG data were recorded continuously and digitally stored.  Video monitoring was available and reviewed as appropriate. Description: The posterior dominant rhythm consists of 8 Hz activity of moderate voltage (25-35 uV) seen predominantly in posterior head regions, symmetric and reactive to eye opening and eye closing. Sleep was characterized by vertex waves, sleep spindles (  12 to 14 Hz), maximal frontocentral region. EEG showed continuous generalized 3 to 6 Hz theta-delta slowing. Hyperventilation and photic stimulation were not performed.   ABNORMALITY - Continuous slow, generalized IMPRESSION: This study is suggestive of mild to moderate diffuse encephalopathy. No seizures or epileptiform discharges were seen throughout the recording. Charlsie Quest   MR BRAIN WO CONTRAST  Result Date: 07/15/2023 CLINICAL DATA:  Seizure with right-sided weakness and aphasia EXAM: MRI HEAD WITHOUT CONTRAST TECHNIQUE: Multiplanar, multiecho pulse sequences of the brain and surrounding structures were obtained without intravenous contrast. COMPARISON:  11/19/2022 FINDINGS: Motion degraded examination. Brain: No acute infarct, mass effect or extra-axial collection. No acute or chronic hemorrhage. There is multifocal hyperintense T2-weighted signal within the white matter. Parenchymal volume and CSF spaces are normal. The midline structures are normal. Vascular: Normal flow voids. Skull and upper cervical spine: Right frontal scalp hematoma. Sinuses/Orbits:No paranasal sinus fluid levels or advanced mucosal thickening. No mastoid or middle ear effusion. Normal  orbits. IMPRESSION: 1. Motion degraded examination. 2. No acute intracranial abnormality. 3. Right frontal scalp hematoma. 4. Findings of chronic small vessel ischemia. Electronically Signed   By: Deatra Robinson M.D.   On: 07/15/2023 01:48   EEG adult  Result Date: 07/14/2023 Jefferson Fuel, MD     07/14/2023  7:44 PM Routine EEG Report Aran Cowens is a 82 y.o. male with a history of seizures who is undergoing an EEG to evaluate for seizures. Report: This EEG was acquired with electrodes placed according to the International 10-20 electrode system (including Fp1, Fp2, F3, F4, C3, C4, P3, P4, O1, O2, T3, T4, T5, T6, A1, A2, Fz, Cz, Pz). The following electrodes were missing or displaced: none. The occipital dominant rhythm was 7 Hz with overriding beta frequencies. This activity is reactive to stimulation. Drowsiness was manifested by background fragmentation; deeper stages of sleep were identified by K complexes and sleep spindles. There was left focal slowing. There were no interictal epileptiform discharges. There were no electrographic seizures identified. Photic stimulation and hyperventilation were not performed. Impression and clinical correlation: This EEG was obtained while awake and asleep and is abnormal due to: - mild diffuse slowing indicative of global cerebral dysfunction - left focal slowing indicative of superimposed focal cerebral dysfunction in that area Epileptiform abnormalities were not seen during this recording. Bing Neighbors, MD [facility] [phone] If 7pm- 7am, please page neurology on call as listed in AMION.   DG Chest 1 View  Result Date: 07/14/2023 CLINICAL DATA:  Seizure. EXAM: CHEST  1 VIEW COMPARISON:  07/28/2019. FINDINGS: Bilateral lung fields are clear. Bilateral costophrenic angles are clear. Normal cardio-mediastinal silhouette. There are surgical staples along the heart border and sternotomy wires, status post CABG (coronary artery bypass graft). No  acute osseous abnormalities. The soft tissues are within normal limits. IMPRESSION: *No active disease. Electronically Signed   By: Jules Schick M.D.   On: 07/14/2023 14:31   CT Head Wo Contrast  Result Date: 07/14/2023 CLINICAL DATA:  Neck trauma.  Fall with head trauma. EXAM: CT HEAD WITHOUT CONTRAST CT MAXILLOFACIAL WITHOUT CONTRAST CT CERVICAL SPINE WITHOUT CONTRAST TECHNIQUE: Multidetector CT imaging of the head, cervical spine, and maxillofacial structures were performed using the standard protocol without intravenous contrast. Multiplanar CT image reconstructions of the cervical spine and maxillofacial structures were also generated. RADIATION DOSE REDUCTION: This exam was performed according to the departmental dose-optimization program which includes automated exposure control, adjustment of the mA and/or kV according to patient size and/or use of  iterative reconstruction technique. COMPARISON:  None Available. FINDINGS: CT HEAD FINDINGS Brain: No evidence of acute infarction, hemorrhage, hydrocephalus, extra-axial collection or mass lesion/mass effect. Vascular: No hyperdense vessel or unexpected calcification. Skull: Normal. Negative for fracture or focal lesion. CT MAXILLOFACIAL FINDINGS Osseous: No fracture or mandibular dislocation. No destructive process. Orbits: Negative. No traumatic or inflammatory finding. Sinuses: Clear Soft tissues: Negative CT CERVICAL SPINE FINDINGS Alignment: Normal Skull base and vertebrae: No acute fracture. No primary bone lesion or focal pathologic process. C4-C7 ACDF. Bridging spondylitic spurs at C2-3 and C7-T1. Soft tissues and spinal canal: No prevertebral fluid or swelling. No visible canal hematoma. Disc levels: C3-4 bilateral foraminal narrowing from disc height loss and ridging. No bony spinal canal impingement detected. Upper chest: No visible injury IMPRESSION: No evidence of intracranial or cervical spine injury. Negative for facial fracture.  Electronically Signed   By: Tiburcio Pea M.D.   On: 07/14/2023 06:43   CT Maxillofacial Wo Contrast  Result Date: 07/14/2023 CLINICAL DATA:  Neck trauma.  Fall with head trauma. EXAM: CT HEAD WITHOUT CONTRAST CT MAXILLOFACIAL WITHOUT CONTRAST CT CERVICAL SPINE WITHOUT CONTRAST TECHNIQUE: Multidetector CT imaging of the head, cervical spine, and maxillofacial structures were performed using the standard protocol without intravenous contrast. Multiplanar CT image reconstructions of the cervical spine and maxillofacial structures were also generated. RADIATION DOSE REDUCTION: This exam was performed according to the departmental dose-optimization program which includes automated exposure control, adjustment of the mA and/or kV according to patient size and/or use of iterative reconstruction technique. COMPARISON:  None Available. FINDINGS: CT HEAD FINDINGS Brain: No evidence of acute infarction, hemorrhage, hydrocephalus, extra-axial collection or mass lesion/mass effect. Vascular: No hyperdense vessel or unexpected calcification. Skull: Normal. Negative for fracture or focal lesion. CT MAXILLOFACIAL FINDINGS Osseous: No fracture or mandibular dislocation. No destructive process. Orbits: Negative. No traumatic or inflammatory finding. Sinuses: Clear Soft tissues: Negative CT CERVICAL SPINE FINDINGS Alignment: Normal Skull base and vertebrae: No acute fracture. No primary bone lesion or focal pathologic process. C4-C7 ACDF. Bridging spondylitic spurs at C2-3 and C7-T1. Soft tissues and spinal canal: No prevertebral fluid or swelling. No visible canal hematoma. Disc levels: C3-4 bilateral foraminal narrowing from disc height loss and ridging. No bony spinal canal impingement detected. Upper chest: No visible injury IMPRESSION: No evidence of intracranial or cervical spine injury. Negative for facial fracture. Electronically Signed   By: Tiburcio Pea M.D.   On: 07/14/2023 06:43   CT Cervical Spine Wo  Contrast  Result Date: 07/14/2023 CLINICAL DATA:  Neck trauma.  Fall with head trauma. EXAM: CT HEAD WITHOUT CONTRAST CT MAXILLOFACIAL WITHOUT CONTRAST CT CERVICAL SPINE WITHOUT CONTRAST TECHNIQUE: Multidetector CT imaging of the head, cervical spine, and maxillofacial structures were performed using the standard protocol without intravenous contrast. Multiplanar CT image reconstructions of the cervical spine and maxillofacial structures were also generated. RADIATION DOSE REDUCTION: This exam was performed according to the departmental dose-optimization program which includes automated exposure control, adjustment of the mA and/or kV according to patient size and/or use of iterative reconstruction technique. COMPARISON:  None Available. FINDINGS: CT HEAD FINDINGS Brain: No evidence of acute infarction, hemorrhage, hydrocephalus, extra-axial collection or mass lesion/mass effect. Vascular: No hyperdense vessel or unexpected calcification. Skull: Normal. Negative for fracture or focal lesion. CT MAXILLOFACIAL FINDINGS Osseous: No fracture or mandibular dislocation. No destructive process. Orbits: Negative. No traumatic or inflammatory finding. Sinuses: Clear Soft tissues: Negative CT CERVICAL SPINE FINDINGS Alignment: Normal Skull base and vertebrae: No acute fracture. No primary bone lesion  or focal pathologic process. C4-C7 ACDF. Bridging spondylitic spurs at C2-3 and C7-T1. Soft tissues and spinal canal: No prevertebral fluid or swelling. No visible canal hematoma. Disc levels: C3-4 bilateral foraminal narrowing from disc height loss and ridging. No bony spinal canal impingement detected. Upper chest: No visible injury IMPRESSION: No evidence of intracranial or cervical spine injury. Negative for facial fracture. Electronically Signed   By: Tiburcio Pea M.D.   On: 07/14/2023 06:43   DG Foot 2 Views Left  Result Date: 07/14/2023 CLINICAL DATA:  Pain. Wound on left foot. Evaluate for osteomyelitis. EXAM:  LEFT FOOT - 2 VIEW COMPARISON:  06/09/2017 FINDINGS: Status post second ray amputation at the head of the metatarsal bone. There is been surgical resection of the distal aspect of the fourth metatarsal bone and fifth metatarsal bone. There are no signs of underlying acute fracture or dislocation. No focal bone erosions identified to indicate osteomyelitis. Vascular calcifications noted. Calcaneal heel spurs. IMPRESSION: 1. No acute findings. 2. 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 3. Surgical resection of the distal aspect of the fourth and fifth metatarsal bones. Electronically Signed   By: Signa Kell M.D.   On: 07/14/2023 05:46   CT ANGIO HEAD NECK W WO CM W PERF (CODE STROKE)  Result Date: 07/14/2023 CLINICAL DATA:  Slurred speech EXAM: CT ANGIOGRAPHY HEAD AND NECK TECHNIQUE: Multidetector CT imaging of the head and neck was performed using the standard protocol during bolus administration of intravenous contrast. Multiplanar CT image reconstructions and MIPs were obtained to evaluate the vascular anatomy. Carotid stenosis measurements (when applicable) are obtained utilizing NASCET criteria, using the distal internal carotid diameter as the denominator. RADIATION DOSE REDUCTION: This exam was performed according to the departmental dose-optimization program which includes automated exposure control, adjustment of the mA and/or kV according to patient size and/or use of iterative reconstruction technique. CONTRAST:  OMNIPAQUE IOHEXOL 350 MG/ML SOLN COMPARISON:  Head CT from earlier today FINDINGS: CTA NECK FINDINGS Aortic arch: Atheromatous plaque.  Three vessel branching. Right carotid system: Mixed density plaque mainly at the bifurcation without flow reducing stenosis or ulceration. Left carotid system: Mixed density plaque mainly at the bifurcation without flow reducing stenosis or ulceration. Vertebral arteries: No proximal subclavian stenosis. The right  vertebral artery is dominant. No vertebral flow reducing stenosis, beading, or dissection. Skeleton: Generalized cervical solid arthrodesis and spondylitic ankylosis. Other neck: No acute finding Upper chest: No acute finding Review of the MIP images confirms the above findings CTA HEAD FINDINGS Anterior circulation: Extensive atheromatous plaque on the carotid siphons. No branch occlusion, beading, or aneurysm. Posterior circulation: Right dominant vertebral artery. Mild atheromatous plaque to the basilar. No branch occlusion, beading, aneurysm, or flow reducing stenosis. Venous sinuses: Unremarkable for contrast timing Anatomic variants: As above Review of the MIP images confirms the above findings IMPRESSION: 1. No emergent finding. 2. Atherosclerosis without flow reducing stenosis or ulceration of major arteries in the head and neck. Electronically Signed   By: Tiburcio Pea M.D.   On: 07/14/2023 04:08   CT HEAD CODE STROKE WO CONTRAST  Result Date: 07/14/2023 CLINICAL DATA:  Code stroke.  Slurred speech, right-sided weakness EXAM: CT HEAD WITHOUT CONTRAST TECHNIQUE: Contiguous axial images were obtained from the base of the skull through the vertex without intravenous contrast. RADIATION DOSE REDUCTION: This exam was performed according to the departmental dose-optimization program which includes automated exposure control, adjustment of the mA and/or kV according to patient size and/or use  of iterative reconstruction technique. COMPARISON:  11/18/2022 FINDINGS: Brain: No evidence of acute infarction, hemorrhage, mass, mass effect, or midline shift. No hydrocephalus or extra-axial collection. Vascular: No hyperdense vessel. Atherosclerotic calcifications in the intracranial carotid and vertebral arteries. Skull: Negative for fracture or focal lesion. Sinuses/Orbits: No acute finding. Other: The mastoid air cells are well aerated. ASPECTS [facility] Stroke Program Early CT Score) - Ganglionic level  infarction (caudate, lentiform nuclei, internal capsule, insula, M1-M3 cortex): 7 - Supraganglionic infarction (M4-M6 cortex): 3 Total score (0-10 with 10 being normal): 10 IMPRESSION: No evidence of acute intracranial process. ASPECTS is 10. Code stroke imaging results were communicated on 07/14/2023 at 3:25 am to provider [facility] via telephone, who verbally acknowledged these results. Electronically Signed   By: Wiliam Ke M.D.   On: 07/14/2023 03:26     Subjective: Patient was seen and examined at bedside.  Overnight events noted.   Patient reports doing much better,  wants to be discharged.   Patient is being discharged to skilled nursing facility for rehab.  Discharge Exam: Vitals:   07/21/23 1206 07/21/23 1208  BP: 134/62 134/62  Pulse: 73 73  Resp: 18 18  Temp: 97.8 F (36.6 C) 98.7 F (37.1 C)  SpO2: 99% 99%   Vitals:   07/21/23 0320 07/21/23 0805 07/21/23 1206 07/21/23 1208  BP: (!) 93/51 133/66 134/62 134/62  Pulse: 70 70 73 73  Resp: 18 18 18 18   Temp: 98 F (36.7 C) 98.4 F (36.9 C) 97.8 F (36.6 C) 98.7 F (37.1 C)  TempSrc: Oral Oral Oral Oral  SpO2: 99% 100% 99% 99%  Weight:      Height:        General: Pt is alert, awake, not in acute distress Cardiovascular: RRR, S1/S2 +, no rubs, no gallops Respiratory: CTA bilaterally, no wheezing, no rhonchi Abdominal: Soft, NT, ND, bowel sounds + Extremities: no edema, no cyanosis    The results of significant diagnostics from this hospitalization (including imaging, microbiology, ancillary and laboratory) are listed below for reference.     Microbiology: No results found for this or any previous visit (from the past 240 hour(s)).   Labs: BNP (last 3 results) No results for input(s): "BNP" in the last 8760 hours. Basic Metabolic Panel: Recent Labs  Lab 07/15/23 0607 07/16/23 0436 07/21/23 0503  NA 142 145 136  K 4.1 4.2 5.1  CL 105 111 101  CO2 26 25 27   GLUCOSE 250* 170* 141*  BUN 50* 36* 17   CREATININE 1.28* 1.08 1.04  CALCIUM 8.7* 8.6* 8.5*  MG  --   --  2.2  PHOS  --   --  3.7   Liver Function Tests: No results for input(s): "AST", "ALT", "ALKPHOS", "BILITOT", "PROT", "ALBUMIN" in the last 168 hours. No results for input(s): "LIPASE", "AMYLASE" in the last 168 hours. No results for input(s): "AMMONIA" in the last 168 hours. CBC: Recent Labs  Lab 07/15/23 0607 07/16/23 0436 07/18/23 1020 07/21/23 0503  WBC 12.5* 8.7 5.3 7.5  HGB 11.8* 10.3* 10.2* 11.0*  HCT 34.4* 31.5* 31.1* 33.6*  MCV 91.5 95.7 93.4 94.9  PLT 136* 104* 120* 174   Cardiac Enzymes: No results for input(s): "CKTOTAL", "CKMB", "CKMBINDEX", "TROPONINI" in the last 168 hours. BNP: Invalid input(s): "POCBNP" CBG: Recent Labs  Lab 07/20/23 1636 07/20/23 2110 07/21/23 0603 07/21/23 0809 07/21/23 1207  GLUCAP 137* 125* 135* 242* 128*   D-Dimer No results for input(s): "DDIMER" in the last 72 hours. Hgb A1c No  results for input(s): "HGBA1C" in the last 72 hours. Lipid Profile No results for input(s): "CHOL", "HDL", "LDLCALC", "TRIG", "CHOLHDL", "LDLDIRECT" in the last 72 hours. Thyroid function studies No results for input(s): "TSH", "T4TOTAL", "T3FREE", "THYROIDAB" in the last 72 hours.  Invalid input(s): "FREET3" Anemia work up No results for input(s): "VITAMINB12", "FOLATE", "FERRITIN", "TIBC", "IRON", "RETICCTPCT" in the last 72 hours. Urinalysis    Component Value Date/Time   COLORURINE STRAW (A) 07/14/2023 1420   APPEARANCEUR CLEAR (A) 07/14/2023 1420   LABSPEC 1.039 (H) 07/14/2023 1420   PHURINE 5.0 07/14/2023 1420   GLUCOSEU >=500 (A) 07/14/2023 1420   HGBUR NEGATIVE 07/14/2023 1420   BILIRUBINUR NEGATIVE 07/14/2023 1420   KETONESUR NEGATIVE 07/14/2023 1420   PROTEINUR NEGATIVE 07/14/2023 1420   NITRITE NEGATIVE 07/14/2023 1420   LEUKOCYTESUR NEGATIVE 07/14/2023 1420   Sepsis Labs Recent Labs  Lab 07/15/23 0607 07/16/23 0436 07/18/23 1020 07/21/23 0503  WBC 12.5* 8.7  5.3 7.5   Microbiology No results found for this or any previous visit (from the past 240 hour(s)).   Time coordinating discharge: Over 30 minutes  SIGNED:   Willeen Niece, MD  [facility] 07/21/2023, 1:30 PM Pager   If 7PM-7AM, please contact night-coverage

## 2023-07-21 NOTE — Discharge Instructions (Signed)
Advised to follow-up with Neurology in 8 weeks. Advised to take Keppra and Vimpat for seizure disorder.

## 2023-07-21 NOTE — Progress Notes (Signed)
Physical Therapy Treatment Patient Details Name: Chris [name] MRN: [id number] DOB: January 20, 1941 Today's Date: 07/21/2023   History of Present Illness 82 yr old male presented 10/22 [facility] after being noted to be acutely altered, multiple recurrent seizures with continued waxing/waning, fell and hit his head in ED with scalp laceration. Transferred to [facility] for LTM EEG. Acute metabolic encephalopathy, suspected status epilepticus.  PMHx: hypertension, hyperlipidemia, CAD s/p four-vessel CABG, TIA, IDDM, osteomyelitis s/p left second toe amputation, and partial complex seizures.    PT Comments  Educated on alternative techniques for bed mobility to ease pain however does not follow instructions during training. Min assist for boost to stand from high surface of bed. Limited by pain. Tolerated standing <30 seconds each attempt with reported radicular pain into Lt thigh. Pt was unable to take steps forward or lateral despite RW and max assist. Patient will continue to benefit from skilled physical therapy services to further improve independence with functional mobility.     If plan is discharge home, recommend the following: A little help with bathing/dressing/bathroom;Assistance with cooking/housework;Direct supervision/assist for medications management;Direct supervision/assist for financial management;Assist for transportation;Supervision due to cognitive status;A lot of help with walking and/or transfers   Can travel by private vehicle     No  Equipment Recommendations  None recommended by PT    Recommendations for Other Services       Precautions / Restrictions Precautions Precautions: Fall Restrictions Weight Bearing Restrictions: No LLE Weight Bearing: Weight bearing as tolerated Other Position/Activity Restrictions: post-op shoes     Mobility  Bed Mobility Overal bed mobility: Needs Assistance Bed Mobility: Supine to Sit, Sit to Supine     Supine to sit: Contact guard Sit  to supine: Contact guard assist   General bed mobility comments: Attempted to teach log roll to ease back pain however pt not following instructions. CGA for safety with transitions in bed.    Transfers Overall transfer level: Needs assistance Equipment used: Rolling walker (2 wheels) Transfers: Sit to/from Stand, Bed to chair/wheelchair/BSC Sit to Stand: Min assist, From elevated surface          Lateral/Scoot Transfers: Contact guard assist General transfer comment: Min assist for boost to stand from elevated surface x2. VC for hand placement. Weight shift, and foot placement to WB through Lt heel only. Poor recall of LE placement prior to standing despite cues. CGA for lateral scoot (mini squat pivot) along bed.    Ambulation/Gait               General Gait Details: Unable to advance LEs forward or lateral with max assist.   Stairs             Wheelchair Mobility     Tilt Bed    Modified Rankin (Stroke Patients Only)       Balance Overall balance assessment: Needs assistance Sitting-balance support: No upper extremity supported, Feet supported Sitting balance-Leahy Scale: Fair     Standing balance support: Bilateral upper extremity supported Standing balance-Leahy Scale: Poor                              Cognition Arousal: Alert Behavior During Therapy: WFL for tasks assessed/performed                                 Problem Solving: Slow processing, Difficulty sequencing, Requires verbal cues General Comments: Unsure  if he prefers doing things his way or is not comprehending techniques provided to ease back pain.        Exercises General Exercises - Lower Extremity Ankle Circles/Pumps: AROM, Both, 10 reps, Supine Quad Sets: Strengthening, Both, 10 reps, Supine Gluteal Sets: Strengthening, Both, 10 reps, Supine Short Arc Quad: Strengthening, Both, 10 reps, Supine Hip ABduction/ADduction: Strengthening, Both, 10  reps, Supine Straight Leg Raises: Strengthening, Both, 5 reps, Supine    General Comments        Pertinent Vitals/Pain Pain Assessment Pain Assessment: Faces Faces Pain Scale: Hurts even more Pain Location: back (reports chronic, but somewhat worse) Pain Descriptors / Indicators: Aching Pain Intervention(s): Monitored during session, Repositioned    Home Living                          Prior Function            PT Goals (current goals can now be found in the care plan section) Acute Rehab PT Goals Patient Stated Goal: go home PT Goal Formulation: With patient/family Time For Goal Achievement: 07/30/23 Potential to Achieve Goals: Good Progress towards PT goals: Progressing toward goals    Frequency    Min 1X/week      PT Plan      Co-evaluation              AM-PAC PT "6 Clicks" Mobility   Outcome Measure  Help needed turning from your back to your side while in a flat bed without using bedrails?: A Little Help needed moving from lying on your back to sitting on the side of a flat bed without using bedrails?: A Little Help needed moving to and from a bed to a chair (including a wheelchair)?: A Little Help needed standing up from a chair using your arms (e.g., wheelchair or bedside chair)?: A Little Help needed to walk in hospital room?: Total Help needed climbing 3-5 steps with a railing? : Total 6 Click Score: 14    End of Session Equipment Utilized During Treatment: Gait belt Activity Tolerance: Patient limited by pain Patient left: in bed;with call bell/phone within reach;with bed alarm set;with family/visitor present   PT Visit Diagnosis: Unsteadiness on feet (R26.81);Muscle weakness (generalized) (M62.81);History of falling (Z91.81);Difficulty in walking, not elsewhere classified (R26.2);Other symptoms and signs involving the nervous system (R29.898);Pain Pain - part of body:  (back)     Time: 1039-1100 PT Time Calculation (min)  (ACUTE ONLY): 21 min  Charges:    $Therapeutic Activity: 8-22 mins PT General Charges $$ ACUTE PT VISIT: 1 Visit                     Kathlyn Sacramento, PT, DPT [facility]  Rehabilitation Services Physical Therapist Office: [phone] Website: [email or web address]    Berton Mount 07/21/2023, 11:35 AM

## 2023-07-21 NOTE — Progress Notes (Signed)
PROGRESS NOTE    Chris [name]  GYI:[id number] DOB: [birth year] DOA: 07/15/2023 PCP: Hoover Browns, MD   Brief Narrative:  This 82 yrs old male with PMH significant for hypertension, hyperlipidemia, CAD s/p four-vessel CABG, TIA, IDDM, osteomyelitis s/p right second toe amputation, and partial complex seizures who presents after being noted to be acutely altered.  Patient woke up yesterday morning around 1 AM with confusion and has twitching like movements of the left side, new right-sided weakness, staring spells and difficulty communicating.  EMS was called and patient was brought as stroke code. Patient follows up with [facility] neurology since summer for the history of seizures.  He had been on Keppra but due to significant drowsiness he was switched to Vimpat recently.  CT head negative for acute stroke.  CTA head and neck negative for LVO.  Patient is not considered to be a thrombolytic candidate.  Patient became agitated while in the ED and fell from the bed hitting his head and left arm.  Trauma scans were negative for any fracture or dislocation.  EEG showed diffuse slowing but no evidence of seizures.  Neurology had recommended continuous EEG due to concern for status epilepticus.  Patient was loaded with Vimpat and was given Ativan.  Long-term EEG > No evidence of seizures noted.  Assessment & Plan:   Principal Problem:   Seizure (HCC) Active Problems:   Acute metabolic encephalopathy   Fall   Controlled type 2 diabetes mellitus without complication, without long-term current use of insulin (HCC)   AKI (acute kidney injury) (HCC)   Chronic ulcer of left foot (HCC)   Essential hypertension   HLD (hyperlipidemia)   Hypothyroidism (acquired)  Acute metabolic encephalopathy: Suspected status epilepticus: Patient presented with confusion,  twitching like movements of left side, new right-sided weakness, staring spells and difficulty communicating. Initial concern was for  possible stroke. CT and subsequent CTA H/N did not note any acute abnormality or large vessel occlusion.   Patient was not a thrombolytics candidate.Subsequent MRI of the brain was negative.   EEG showed diffuse slowing with superimposed L focal slowing and overriding beta frequencies. He had been given Ativan IV and subsequently loaded with Vimpat 200 mg IV. Continue Seizure precautions Continue Vimpat, Continue Keppra 1gm twice daily. Appreciate neurology consultative services. LTM EEG does not reveal evidence of seizures or epileptiform discharges. Neurology signed off, recommended to continue vimpat and Keppra.   Gait disturbance: Fall with Scalp laceration: Patient was noted to be unsteady on his feet and had fallen while in the ED prior to transfer,  hitting his head.   CT scan head, maxillofacial, and cervical spine did not note any acute fracture.   After transfer patient noted to have significant bleeding from the scalp laceration which had been previously managed with Steri-Strips. PT/OT to eval and treat General Surgery consulted to repair scalp laceration.  No plans for any scalp laceration repair.   Continue pressure dressing. Bleeding has stopped.   Type 2 diabetes mellitus with hyperglycemia: HbA1c noted to be 6.8 on 10/21.   Patient was discharged on Farxiga 10 mg, insulin75/25 30 units twice daily, and lispro insulin 0 to 15 units 3 times daily with meals. Continue Hypoglycemic protocol. Continue Farxiga CBGs before every meal with sensitive SSI.  Acute kidney injury: > Resolved. Likely prerenal.  Serum creatinine improved with IV hydration. Renal functions back to normal.   Chronic left foot ulcer: Peripheral vascular disease: No signs for infection appreciated on exam.  X-rays did not note any signs for osteomyelitis. Continue aspirin and pentoxifylline   Essential hypertension Continue losartan 25 mg dialy.   Hypothyroidism: Continue levothyroxine.    Hyperlipidemia: Continue atorvastatin.   DVT prophylaxis: SCDs Code Status:  Full code Family Communication: No family at bed side. Disposition Plan:     Status is: Inpatient Remains inpatient appropriate because: Admitted for CVA workup, EEG completed. Patient awaiting SNF placement.    Consultants:  Neurology  Procedures:CT H, MRI, EEG  Antimicrobials:  Anti-infectives (From admission, onward)    None      Subjective: Patient was seen and examined at bedside.  Overnight events noted.   Patient reports feeling much improved.  He has no further bleeding from scalp laceration. Patient is medically clear,  awaiting SNF placement.   Objective: Vitals:   07/21/23 0320 07/21/23 0805 07/21/23 1206 07/21/23 1208  BP: (!) 93/51 133/66 134/62 134/62  Pulse: 70 70 73 73  Resp: 18 18 18 18   Temp: 98 F (36.7 C) 98.4 F (36.9 C) 97.8 F (36.6 C) 98.7 F (37.1 C)  TempSrc: Oral Oral Oral Oral  SpO2: 99% 100% 99% 99%  Weight:      Height:        Intake/Output Summary (Last 24 hours) at 07/21/2023 1216 Last data filed at 07/21/2023 0800 Gross per 24 hour  Intake 243 ml  Output 1950 ml  Net -1707 ml   Filed Weights   07/16/23 0753  Weight: 92.4 kg    Examination:  General exam: Appears calm, comfortable,  NAD, laceration noted on the front of head. Respiratory system: CTA bilaterally. Respiratory effort normal.  RR 14 Cardiovascular system: S1 & S2 heard, RRR. No JVD, murmurs, rubs, gallops or clicks. No pedal edema. Gastrointestinal system: Abdomen is non distended, soft and non tender. Normal bowel sounds heard. Central nervous system: Alert and oriented x 3. No focal neurological deficits. Extremities: No edema, no cyanosis, no clubbing Skin: No rashes, lesions or ulcers Psychiatry: Judgement and insight appear normal. Mood & affect appropriate.     Data Reviewed: I have personally reviewed following labs and imaging studies  CBC: Recent Labs  Lab  07/15/23 0607 07/16/23 0436 07/18/23 1020 07/21/23 0503  WBC 12.5* 8.7 5.3 7.5  HGB 11.8* 10.3* 10.2* 11.0*  HCT 34.4* 31.5* 31.1* 33.6*  MCV 91.5 95.7 93.4 94.9  PLT 136* 104* 120* 174   Basic Metabolic Panel: Recent Labs  Lab 07/15/23 0607 07/16/23 0436 07/21/23 0503  NA 142 145 136  K 4.1 4.2 5.1  CL 105 111 101  CO2 26 25 27   GLUCOSE 250* 170* 141*  BUN 50* 36* 17  CREATININE 1.28* 1.08 1.04  CALCIUM 8.7* 8.6* 8.5*  MG  --   --  2.2  PHOS  --   --  3.7   GFR: Estimated Creatinine Clearance: 64.8 mL/min (by C-G formula based on SCr of 1.04 mg/dL). Liver Function Tests: No results for input(s): "AST", "ALT", "ALKPHOS", "BILITOT", "PROT", "ALBUMIN" in the last 168 hours.  No results for input(s): "LIPASE", "AMYLASE" in the last 168 hours. No results for input(s): "AMMONIA" in the last 168 hours. Coagulation Profile: No results for input(s): "INR", "PROTIME" in the last 168 hours.  Cardiac Enzymes: No results for input(s): "CKTOTAL", "CKMB", "CKMBINDEX", "TROPONINI" in the last 168 hours. BNP (last 3 results) No results for input(s): "PROBNP" in the last 8760 hours. HbA1C: No results for input(s): "HGBA1C" in the last 72 hours.  CBG: Recent Labs  Lab  07/20/23 1636 07/20/23 2110 07/21/23 0603 07/21/23 0809 07/21/23 1207  GLUCAP 137* 125* 135* 242* 128*   Lipid Profile: No results for input(s): "CHOL", "HDL", "LDLCALC", "TRIG", "CHOLHDL", "LDLDIRECT" in the last 72 hours. Thyroid Function Tests: No results for input(s): "TSH", "T4TOTAL", "FREET4", "T3FREE", "THYROIDAB" in the last 72 hours.  Anemia Panel: No results for input(s): "VITAMINB12", "FOLATE", "FERRITIN", "TIBC", "IRON", "RETICCTPCT" in the last 72 hours. Sepsis Labs: No results for input(s): "PROCALCITON", "LATICACIDVEN" in the last 168 hours.  No results found for this or any previous visit (from the past 240 hour(s)).   Radiology Studies: No results found.  Scheduled Meds:  aspirin EC   81 mg Oral Daily   atorvastatin  80 mg Oral Daily   dapagliflozin propanediol  10 mg Oral Daily   influenza vaccine adjuvanted  0.5 mL Intramuscular Tomorrow-1000   insulin aspart  0-5 Units Subcutaneous QHS   insulin aspart  0-9 Units Subcutaneous TID WC   insulin aspart protamine- aspart  30 Units Subcutaneous BID WC   lacosamide  200 mg Oral BID   levETIRAcetam  500 mg Oral BID   levothyroxine  88 mcg Oral Q0600   losartan  25 mg Oral Daily   pentoxifylline  400 mg Oral BID   polyethylene glycol  17 g Oral Daily   senna-docusate  1 tablet Oral BID   sodium chloride flush  3 mL Intravenous Q12H   Continuous Infusions:     LOS: 6 days    Time spent: 35 mins    Willeen Niece, MD [facility]   If 7PM-7AM, please contact night-coverage

## 2023-07-21 NOTE — Plan of Care (Signed)
  Problem: Safety: Goal: Ability to remain free from injury will improve Outcome: Progressing   

## 2023-07-21 NOTE — TOC Transition Note (Signed)
Transition of Care [facility]) - CM/SW Discharge Note   Patient Details  Name: Chris [name] MRN: [id number] Date of Birth: [birth year]  Transition of Care [facility]) CM/SW Contact:  Erin Sons, LCSW Phone Number: 07/21/2023, 2:18 PM   Clinical Narrative:     CSW emailed list of bed offers to spouse. CSW called spouse as well and discussed offers. They would like [facility]. CSW confirmed with Phineas Semen they can admit today.   Per MD patient ready for DC to [facility]. RN, patient, patient's family, and facility notified of DC. Discharge Summary and FL2 sent to facility. RN to call report prior to discharge [phone] ). DC packet on chart. Ambulance transport requested for patient.   CSW will sign off for now as social work intervention is no longer needed. Please consult [place] again if new needs arise.   Final next level of care: Skilled Nursing Facility Barriers to Discharge: No Barriers Identified   Patient Goals and CMS Choice      Discharge Placement                Patient chooses bed at: [facility] Patient to be transferred to facility by: [facility] Name of family member notified: Spouse [place] Patient and family notified of of transfer: 07/21/23  Discharge Plan and Services Additional resources added to the After Visit Summary for     Discharge Planning Services: CM Consult                                 Social Determinants of Health (SDOH) Interventions SDOH Screenings   Food Insecurity: No Food Insecurity (07/15/2023)  Housing: Patient Declined (07/15/2023)  Transportation Needs: No Transportation Needs (07/15/2023)  Utilities: Not At Risk (07/15/2023)  Financial Resource Strain: Low Risk  (08/23/2022)   Received from [facility], [facility]  Physical Activity: Insufficiently Active (08/23/2022)   Received from [facility], [facility]  Social Connections: Moderately Integrated (08/23/2022)   Received from [facility], [facility]  Stress: No Stress Concern Present (08/23/2022)   Received from [facility], [facility]  Tobacco Use: Low Risk  (07/15/2023)  Health Literacy: Low Risk  (08/23/2022)   Received from [facility], [facility]     Readmission Risk Interventions     No data to display

## 2023-07-30 ENCOUNTER — Other Ambulatory Visit: Payer: Self-pay

## 2023-07-30 ENCOUNTER — Emergency Department (HOSPITAL_COMMUNITY): Payer: Medicare Other

## 2023-07-30 ENCOUNTER — Emergency Department (HOSPITAL_COMMUNITY)
Admission: EM | Admit: 2023-07-30 | Discharge: 2023-07-31 | Disposition: A | Discharge status: Home / Self Care | Payer: Medicare Other | Attending: Emergency Medicine | Admitting: Emergency Medicine

## 2023-07-30 DIAGNOSIS — I951 Orthostatic hypotension: Secondary | ICD-10-CM

## 2023-07-30 DIAGNOSIS — Z7984 Long term (current) use of oral hypoglycemic drugs: Secondary | ICD-10-CM | POA: Insufficient documentation

## 2023-07-30 DIAGNOSIS — Z7982 Long term (current) use of aspirin: Secondary | ICD-10-CM | POA: Insufficient documentation

## 2023-07-30 DIAGNOSIS — I10 Essential (primary) hypertension: Secondary | ICD-10-CM | POA: Insufficient documentation

## 2023-07-30 DIAGNOSIS — M542 Cervicalgia: Secondary | ICD-10-CM | POA: Insufficient documentation

## 2023-07-30 DIAGNOSIS — E119 Type 2 diabetes mellitus without complications: Secondary | ICD-10-CM | POA: Insufficient documentation

## 2023-07-30 DIAGNOSIS — R55 Syncope and collapse: Secondary | ICD-10-CM | POA: Diagnosis present

## 2023-07-30 DIAGNOSIS — Z79899 Other long term (current) drug therapy: Secondary | ICD-10-CM | POA: Diagnosis not present

## 2023-07-30 DIAGNOSIS — W19XXXA Unspecified fall, initial encounter: Secondary | ICD-10-CM

## 2023-07-30 DIAGNOSIS — I2510 Atherosclerotic heart disease of native coronary artery without angina pectoris: Secondary | ICD-10-CM | POA: Diagnosis not present

## 2023-07-30 DIAGNOSIS — W01198A Fall on same level from slipping, tripping and stumbling with subsequent striking against other object, initial encounter: Secondary | ICD-10-CM | POA: Diagnosis not present

## 2023-07-30 DIAGNOSIS — S51011A Laceration without foreign body of right elbow, initial encounter: Secondary | ICD-10-CM | POA: Insufficient documentation

## 2023-07-30 DIAGNOSIS — Z794 Long term (current) use of insulin: Secondary | ICD-10-CM | POA: Diagnosis not present

## 2023-07-30 DIAGNOSIS — S0101XA Laceration without foreign body of scalp, initial encounter: Secondary | ICD-10-CM | POA: Diagnosis not present

## 2023-07-30 DIAGNOSIS — Z8673 Personal history of transient ischemic attack (TIA), and cerebral infarction without residual deficits: Secondary | ICD-10-CM | POA: Insufficient documentation

## 2023-07-30 LAB — COMPREHENSIVE METABOLIC PANEL
ALT: 31 U/L (ref 0–44)
AST: 39 U/L (ref 15–41)
Albumin: 3.2 g/dL — ABNORMAL LOW (ref 3.5–5.0)
Alkaline Phosphatase: 164 U/L — ABNORMAL HIGH (ref 38–126)
Anion gap: 14 (ref 5–15)
BUN: 24 mg/dL — ABNORMAL HIGH (ref 8–23)
CO2: 26 mmol/L (ref 22–32)
Calcium: 8.8 mg/dL — ABNORMAL LOW (ref 8.9–10.3)
Chloride: 99 mmol/L (ref 98–111)
Creatinine, Ser: 1.1 mg/dL (ref 0.61–1.24)
GFR, Estimated: >60 mL/min (ref >60)
Glucose, Bld: 149 mg/dL — ABNORMAL HIGH (ref 70–99)
Potassium: 3.6 mmol/L (ref 3.5–5.1)
Sodium: 139 mmol/L (ref 135–145)
Total Bilirubin: 0.5 mg/dL (ref <1.2)
Total Protein: 6.8 g/dL (ref 6.5–8.1)

## 2023-07-30 LAB — URINALYSIS, ROUTINE W REFLEX MICROSCOPIC
Bacteria, UA: NONE SEEN
Bilirubin Urine: NEGATIVE
Glucose, UA: >=500 mg/dL — AB
Hgb urine dipstick: NEGATIVE
Ketones, ur: NEGATIVE mg/dL
Leukocytes,Ua: NEGATIVE
Nitrite: NEGATIVE
Protein, ur: NEGATIVE mg/dL
Specific Gravity, Urine: 1.021 (ref 1.005–1.030)
pH: 5 (ref 5.0–8.0)

## 2023-07-30 LAB — CBC WITH DIFFERENTIAL/PLATELET
Abs Immature Granulocytes: 0.04 10*3/uL (ref 0.00–0.07)
Basophils Absolute: 0.1 10*3/uL (ref 0.0–0.1)
Basophils Relative: 1 %
Eosinophils Absolute: 0.1 10*3/uL (ref 0.0–0.5)
Eosinophils Relative: 1 %
HCT: 36.6 % — ABNORMAL LOW (ref 39.0–52.0)
Hemoglobin: 11.8 g/dL — ABNORMAL LOW (ref 13.0–17.0)
Immature Granulocytes: 0 %
Lymphocytes Relative: 16 %
Lymphs Abs: 1.7 10*3/uL (ref 0.7–4.0)
MCH: 30.5 pg (ref 26.0–34.0)
MCHC: 32.2 g/dL (ref 30.0–36.0)
MCV: 94.6 fL (ref 80.0–100.0)
Monocytes Absolute: 0.9 10*3/uL (ref 0.1–1.0)
Monocytes Relative: 8 %
Neutro Abs: 8 10*3/uL — ABNORMAL HIGH (ref 1.7–7.7)
Neutrophils Relative %: 74 %
Platelets: 198 10*3/uL (ref 150–400)
RBC: 3.87 MIL/uL — ABNORMAL LOW (ref 4.22–5.81)
RDW: 13.3 % (ref 11.5–15.5)
WBC: 10.7 10*3/uL — ABNORMAL HIGH (ref 4.0–10.5)
nRBC: 0 % (ref 0.0–0.2)

## 2023-07-30 LAB — TROPONIN I (HIGH SENSITIVITY)
Troponin I (High Sensitivity): 21 ng/L — ABNORMAL HIGH (ref <18)
Troponin I (High Sensitivity): 19 ng/L — ABNORMAL HIGH (ref <18)

## 2023-07-30 LAB — RAPID URINE DRUG SCREEN, HOSP PERFORMED
Amphetamines: NOT DETECTED
Barbiturates: NOT DETECTED
Benzodiazepines: NOT DETECTED
Cocaine: NOT DETECTED
Opiates: NOT DETECTED
Tetrahydrocannabinol: NOT DETECTED

## 2023-07-30 MED ORDER — OXYCODONE HCL 5 MG PO TABS
5.0000 mg | ORAL_TABLET | Freq: Once | ORAL | Status: AC
  Administered 2023-07-30: 5 mg via ORAL
  Filled 2023-07-30: qty 1

## 2023-07-30 MED ORDER — SODIUM CHLORIDE 0.9 % IV BOLUS
1000.0000 mL | Freq: Once | INTRAVENOUS | Status: AC
  Administered 2023-07-30: 1000 mL via INTRAVENOUS

## 2023-07-30 MED ORDER — OXYCODONE-ACETAMINOPHEN 5-325 MG PO TABS
1.0000 | ORAL_TABLET | Freq: Once | ORAL | Status: AC
  Administered 2023-07-30: 1 via ORAL
  Filled 2023-07-30: qty 1

## 2023-07-30 MED ORDER — CYCLOBENZAPRINE HCL 10 MG PO TABS
10.0000 mg | ORAL_TABLET | Freq: Once | ORAL | Status: AC
  Administered 2023-07-30: 10 mg via ORAL
  Filled 2023-07-30: qty 1

## 2023-07-30 NOTE — ED Triage Notes (Signed)
Patient BIB EMS from [facility], unwitnessed fall, hit back of head floor. Lost balance fell back backwards.A&Ox4, No LOC , No blood thinners. C-collar, small skin tear to right elbow. 150/60, 76, 97%, CBG 160

## 2023-07-30 NOTE — ED Notes (Signed)
[facility] Called. #5 on the list

## 2023-07-30 NOTE — ED Notes (Signed)
Pt + orthostatic upon standing, unable to complete ambulation trial at this time.

## 2023-07-30 NOTE — Discharge Instructions (Addendum)
Thank you for coming to [facility] Emergency Department. You were seen for fall with neck pain.  You were slightly dehydrated and we hydrated you in the ER.  Please stay hydrated   Please follow up with your spine doctor within 1 week.  Please walk with your walker and with assistance. Please take care while walking to go slowly and with help in order to avoid falling again.  Return to the ED if you develop any of the following: - Fever (100.4 F or 38 C) or chills at home that do not respond to over the counter medications - Weakness, numbness, or tingling in your extremities - Difficulty emptying bladder / urinary incontinence - Fecal incontinence - Uncontrolled nausea/vomiting with inability to keep down liquids - Feeling as though you are going to pass out or passing out - Anything else that concerns you

## 2023-07-30 NOTE — ED Notes (Signed)
On the way to the restroom pt had a syncopal event in the wheelchair. When pt came to, pt was not responding, but appeared to be reaching for something that was not there. MD notified.

## 2023-07-30 NOTE — ED Notes (Signed)
Help get patient on the monitor did EKG shown to er provider patient is resting with call bell in reach

## 2023-07-30 NOTE — ED Provider Notes (Signed)
  Physical Exam  BP (!) 123/101 (BP Location: Left Arm)   Pulse 92   Temp 97.6 F (36.4 C) (Oral)   Resp 18   SpO2 100%   Physical Exam  Procedures  Procedures  ED Course / MDM   Clinical Course as of 07/30/23 2219  Wed Jul 30, 2023  1328 Persistent pain in neck on palpation and with extension. Exchanged EMS C-collar for [employer] and discussed with patient MRI here vs DC w/ spine f/u. Patient would prefer C-collar and DC w/ spine f/u. Has extensive spinal history and sees [facility]. Will call and get appt. DC w/ discharge instructions/return precautions. All questions answered to patient's satisfaction.   [HN]  1611 While awaiting PTAR for discharge back to rehab facility, patient was assisted the bathroom. While in the bathroom, patient became disoriented, lightheaded, and pale, and he briefly lost consciousness witnessed by nursing. While waking up, he was reaching for things in the air that weren't there. He denies CP, SOB, lightheadedness but states he just has his chronic back pain. Will obtain labs including urinalysis. Consider orthostatic,  Patient is signed out to oncoming EDP Dr. Silverio Lay. [HN]    Clinical Course User Index [HN] Loetta Rough, MD   Medical Decision Making Care assumed at 4:10 pm.  Patient had a mechanical fall and CT head and cervical spine unremarkable.  Patient was supposed to be discharged and felt lightheaded and dizzy.  Labs and IV fluids were ordered.  7 pm I reviewed patient's labs and troponin is stable from 21-19.  UA is unremarkable.  Patient received 1 L normal saline bolus.  However his blood pressure dropped to the 80s when he stands up.  Family is at bedside.  Will order another liter bolus  10:21 PM Patient received another liter bolus.  Patient is feeling better now.  He is able to stand up and his blood pressure while standing is 110/80.  At this point he is stable for discharge back to facility.  Of note, patient has chronic neck pain.  I  remove his c-collar and he states that he is still better without the collar.  He can follow-up with [facility] outpatient.  Problems Addressed: Fall, initial encounter: acute illness or injury Neck pain, acute: acute illness or injury Orthostasis: acute illness or injury Syncope, unspecified syncope type: acute illness or injury  Amount and/or Complexity of Data Reviewed Labs: ordered. Decision-making details documented in ED Course. Radiology: ordered and independent interpretation performed. Decision-making details documented in ED Course. ECG/medicine tests: ordered and independent interpretation performed. Decision-making details documented in ED Course.  Risk Prescription drug management.          Charlynne Pander, MD 07/30/23 2222

## 2023-07-30 NOTE — ED Provider Notes (Signed)
[facility] EMERGENCY DEPARTMENT AT [facility] Provider Note   CSN: [id number] Arrival date & time: 07/30/23  2355     History  Chief Complaint  Patient presents with   Chris [name]    Chris [name] is a 82 y.o. male with PMH as listed below who is BIB EMS from [facility] with an unwitnessed fall, hit back of head floor. He states he tried to get up and walk without assistance and he lost balance fell back backwards. Tried to grab onto the door instead of his walker. He falls when he tries to walk without assistance. Fell two weeks ago for the same reason. He is A&Ox4, denies LOC , No blood thinners. Has bruising to face and small frontal scalp abrasion from fall 2 weeks ago. Is in an EMS C-collar, and complains of posterior neck pain. He has small skin tear to right elbow. EMS vitals 150/60, 76, 97%, CBG 160 mg/dL.   Past Medical History:  Diagnosis Date   Coronary artery disease 2006   4-vessel CABG   Hyperlipidemia    Hypertension    TIA (transient ischemic attack)    Type 2 diabetes mellitus (HCC)        Home Medications Prior to Admission medications   Medication Sig Start Date End Date Taking? Authorizing Provider  aspirin EC 81 MG tablet Take 81 mg by mouth daily.   Yes [provider]  atorvastatin (LIPITOR) 80 MG tablet Take 80 mg by mouth at bedtime.   Yes [provider]  cetirizine (ZYRTEC) 10 MG tablet Take 10 mg by mouth daily as needed for allergies.   Yes [provider]  cyclobenzaprine (FLEXERIL) 10 MG tablet Take 1 tablet (10 mg total) by mouth 3 (three) times daily as needed for muscle spasms. 07/21/23  Yes Willeen Niece, MD  dapagliflozin propanediol (FARXIGA) 10 MG TABS tablet Take 10 mg by mouth at bedtime.   Yes [provider]  Ferrous Sulfate (IRON) 325 (65 Fe) MG TABS Take 1 tablet by mouth daily.   Yes [provider]  furosemide (LASIX) 40 MG tablet TAKE 1 TABLET(40 MG) BY MOUTH EVERY DAY  09/22/18  Yes [provider]  gabapentin (NEURONTIN) 300 MG capsule Take 3 capsules by mouth 2 (two) times daily. 09/22/18  Yes [provider]  insulin aspart (NOVOLOG) 100 UNIT/ML injection Inject 4-14 Units into the skin 4 (four) times daily -  before meals and at bedtime. Per sliding scale 150 - 200 = 4 units 201 - 250 = 6 units 251 - 300 = 8 units 301 - 350 = 10 units 451 - 400 = 12 units 401 - 450 = 14 units Greater than 450 call NP/PA   Yes [provider]  insulin NPH-regular Human (70-30) 100 UNIT/ML injection Inject 30 Units into the skin in the morning and at bedtime.   Yes [provider]  lacosamide (VIMPAT) 200 MG TABS tablet Take 1 tablet (200 mg total) by mouth 2 (two) times daily. 07/15/23  Yes Esaw Grandchild A, DO  levETIRAcetam (KEPPRA) 250 MG tablet Take 250 mg by mouth 2 (two) times daily.   Yes [provider]  levothyroxine (SYNTHROID, LEVOTHROID) 88 MCG tablet TAKE 1 TABLET BY MOUTH DAILY AT 6 AM 10/29/18  Yes [provider]  losartan (COZAAR) 25 MG tablet Take 1 tablet (25 mg total) by mouth daily. 07/22/23 08/21/23 Yes Willeen Niece, MD  pentoxifylline (TRENTAL) 400 MG CR tablet TAKE 1 TABLET(400 MG)  BY MOUTH TWICE DAILY 07/29/18  Yes [provider]  Semaglutide,0.25 or 0.5MG /DOS, (OZEMPIC, 0.25 OR 0.5 MG/DOSE,) 2 MG/1.5ML SOPN Inject 0.25 mg into the skin once a week. On Mondays   Yes [provider]      Allergies    Lisinopril and No known allergies    Review of Systems   Review of Systems A 10 point review of systems was performed and is negative unless otherwise reported in HPI.  Physical Exam Updated Vital Signs BP (!) 102/55   Pulse 76   Temp 98.2 F (36.8 C)   Resp 18   SpO2 98%  Physical Exam  PRIMARY SURVEY  Airway Airway intact  Breathing Bilateral breath sounds  Circulation Carotid/femoral pulses 2+ intact bilaterally  GCS E =  4 V =  5 M =  6 Total = 15   Environment All clothes removed      SECONDARY SURVEY  Gen: -NAD  HEENT: -Head: Small skin tear to R frontal scalp from 2 weeks ago patient states. Other abrasions and very superficial small lacerations to posterior scalp and right temporal scalp. No TTP. Skull is clear of deformities or depressions -Forehead: Purple/yellow ecchymoses to forehead/periorbital regions  -Midface: Stable -Eyes: No visible injury to eyelids or eye, PERRL, EOMI -Nose: No gross deformities -Mouth: No injuries to lips, tongue or teeth. No trismus or malposition -Ears: No auricular hematoma -Neck: Trachea is midline, no distended neck veins  Chest: -No tenderness, deformities, bruising or crepitus to clavicles or chest -Normal chest expansion -Normal heart sounds, S1/S2 normal, no m/r/g -No wheezes, rales, rhonchi  Abdomen: -No tenderness, bruising or penetrating injury  Pelvis: -Pelvis is stable and non-tender  Extremities: Right Upper Extremity: -No point tenderness, deformity or other signs of injury -Radial pulse intact RUE, cap refill good -Normal sensation -Normal ROM, good strength Left Upper Extremity: -No point tenderness, deformity or other signs of injury -Radial pulse intact LUE, cap refill good -Normal sensation -Normal ROM, good strength Right Lower Extremity: -No point tenderness, deformity or other signs of injury -DP intact RLE -Normal sensation -Normal ROM, good strength Left Lower Extremity: -No point tenderness, deformity or other signs of injury -DP intact LLE -Normal sensation -Normal ROM, good strength  Back/Spine: -+Midline lower C spine tenderness without deformity or step-off -Collar: EMS collar in place   Other: N/A     ED Results / Procedures / Treatments   Labs (all labs ordered are listed, but only abnormal results are displayed) Labs Reviewed  CBC WITH DIFFERENTIAL/PLATELET  COMPREHENSIVE METABOLIC PANEL  URINALYSIS, ROUTINE W REFLEX MICROSCOPIC  RAPID URINE  DRUG SCREEN, HOSP PERFORMED  TROPONIN I (HIGH SENSITIVITY)    EKG EKG Interpretation Date/Time:  Wednesday July 30 2023 09:30:20 EST Ventricular Rate:  75 PR Interval:  196 QRS Duration:  109 QT Interval:  390 QTC Calculation: 436 R Axis:   78  Text Interpretation: Normal sinus rhythm Incomplete LBBB Confirmed by Vivi Barrack [phone]) on 07/30/2023 1:45:28 PM  Radiology CT Head Wo Contrast  Result Date: 07/30/2023 CLINICAL DATA:  Head trauma, minor (Age >= 65y); Neck trauma (Age >= 65y). Fall. EXAM: CT HEAD WITHOUT CONTRAST CT CERVICAL SPINE WITHOUT CONTRAST TECHNIQUE: Multidetector CT imaging of the head and cervical spine was performed following the standard protocol without intravenous contrast. Multiplanar CT image reconstructions of the cervical spine were also generated. RADIATION DOSE REDUCTION: This exam was performed according to the departmental dose-optimization program which includes automated exposure control, adjustment of the mA and/or  kV according to patient size and/or use of iterative reconstruction technique. COMPARISON:  CT head and cervical spine 07/14/2023. FINDINGS: CT HEAD FINDINGS Brain: No acute hemorrhage. Unchanged mild chronic small-vessel disease. Cortical gray-white differentiation is otherwise preserved. Prominence of the ventricles and sulci within expected range for age. No hydrocephalus or extra-axial collection. No mass effect or midline shift. Vascular: No hyperdense vessel or unexpected calcification. Skull: No calvarial fracture or suspicious bone lesion. Skull base is unremarkable. Sinuses/Orbits: No acute finding. Other: None. CT CERVICAL SPINE FINDINGS Alignment: Normal. Skull base and vertebrae: Unchanged postoperative appearance from prior C4-C7 ACDF with solid bony fusion across the intervening disc spaces. Hardware at C4-5 is intact without surrounding lucency or fracture. No acute fracture elsewhere within the cervical spine. Craniocervical  junction is intact. Soft tissues and spinal canal: No prevertebral fluid or swelling. No visible canal hematoma. Disc levels: No more than mild spinal canal stenosis at C2-3 and C3-4. Upper chest: No acute findings. Other: Atherosclerotic calcifications of the carotid bulbs. IMPRESSION: 1. No acute intracranial abnormality. 2. No acute cervical spine fracture or traumatic listhesis. 3. Unchanged postoperative appearance from prior C4-C7 ACDF with solid bony fusion across the intervening disc spaces. Electronically Signed   By: Orvan Falconer M.D.   On: 07/30/2023 13:00   CT Cervical Spine Wo Contrast  Result Date: 07/30/2023 CLINICAL DATA:  Head trauma, minor (Age >= 65y); Neck trauma (Age >= 65y). Fall. EXAM: CT HEAD WITHOUT CONTRAST CT CERVICAL SPINE WITHOUT CONTRAST TECHNIQUE: Multidetector CT imaging of the head and cervical spine was performed following the standard protocol without intravenous contrast. Multiplanar CT image reconstructions of the cervical spine were also generated. RADIATION DOSE REDUCTION: This exam was performed according to the departmental dose-optimization program which includes automated exposure control, adjustment of the mA and/or kV according to patient size and/or use of iterative reconstruction technique. COMPARISON:  CT head and cervical spine 07/14/2023. FINDINGS: CT HEAD FINDINGS Brain: No acute hemorrhage. Unchanged mild chronic small-vessel disease. Cortical gray-white differentiation is otherwise preserved. Prominence of the ventricles and sulci within expected range for age. No hydrocephalus or extra-axial collection. No mass effect or midline shift. Vascular: No hyperdense vessel or unexpected calcification. Skull: No calvarial fracture or suspicious bone lesion. Skull base is unremarkable. Sinuses/Orbits: No acute finding. Other: None. CT CERVICAL SPINE FINDINGS Alignment: Normal. Skull base and vertebrae: Unchanged postoperative appearance from prior C4-C7 ACDF with  solid bony fusion across the intervening disc spaces. Hardware at C4-5 is intact without surrounding lucency or fracture. No acute fracture elsewhere within the cervical spine. Craniocervical junction is intact. Soft tissues and spinal canal: No prevertebral fluid or swelling. No visible canal hematoma. Disc levels: No more than mild spinal canal stenosis at C2-3 and C3-4. Upper chest: No acute findings. Other: Atherosclerotic calcifications of the carotid bulbs. IMPRESSION: 1. No acute intracranial abnormality. 2. No acute cervical spine fracture or traumatic listhesis. 3. Unchanged postoperative appearance from prior C4-C7 ACDF with solid bony fusion across the intervening disc spaces. Electronically Signed   By: Orvan Falconer M.D.   On: 07/30/2023 13:00    Procedures Procedures    Medications Ordered in ED Medications  cyclobenzaprine (FLEXERIL) tablet 10 mg (10 mg Oral Given 07/30/23 1209)  oxyCODONE (Oxy IR/ROXICODONE) immediate release tablet 5 mg (5 mg Oral Given 07/30/23 1209)  sodium chloride 0.9 % bolus 1,000 mL (1,000 mLs Intravenous New Bag/Given 07/30/23 1604)    ED Course/ Medical Decision Making/ A&P  Medical Decision Making Amount and/or Complexity of Data Reviewed Labs: ordered. Radiology: ordered.  Risk Prescription drug management.    This patient presents to the ED for concern of falls, this involves an extensive number of treatment options, and is a complaint that carries with it a high risk of complications and morbidity.  I considered the following differential and admission for this acute, potentially life threatening condition.   MDM:    DDX for trauma includes but is not limited to:  -Head Injury such as skull fx or ICH  -No chest wall or abdominal wall TTP or signs of trauma to indicate Chest Injury and Abdominal Injury including hemo/pneumothorax, cardiac, abdominal solid and hollow organ injury -Spinal Cord or Vertebral injury - no  FNDs but does have neck pain to palpation, will get CT -No sign of any extremity fractures -Patient does report that he has fallen a couple of times over the last several weeks but states that it is just because he refuses to wait for the nurse and tries to ambulate on his own. He states he has otherwise felt in his NSOH. No urinary sxs, abdominal pain, N/V/D, chest pain, palpitations, SOB, or FNDs.    Clinical Course as of 07/30/23 1618  Wed Jul 30, 2023  1328 Persistent pain in neck on palpation and with extension. Exchanged EMS C-collar for [employer] and discussed with patient MRI here vs DC w/ spine f/u. Patient would prefer C-collar and DC w/ spine f/u. Has extensive spinal history and sees [facility]. Will call and get appt. DC w/ discharge instructions/return precautions. All questions answered to patient's satisfaction.   [HN]  1611 While awaiting PTAR for discharge back to rehab facility, patient was assisted the bathroom. While in the bathroom, patient became disoriented, lightheaded, and pale, and he briefly lost consciousness witnessed by nursing. While waking up, he was reaching for things in the air that weren't there. He denies CP, SOB, lightheadedness but states he just has his chronic back pain. Will obtain labs including urinalysis. Consider orthostatic,  Patient is signed out to oncoming EDP Dr. Silverio Lay. [HN]    Clinical Course User Index [HN] Loetta Rough, MD    Imaging Studies ordered: I ordered imaging studies including CTH, CT C-spine I independently visualized and interpreted imaging. I agree with the radiologist interpretation  Additional history obtained from chart review, spouse at bedside.    Reevaluation: After the interventions noted above, I reevaluated the patient and found that they have :improved  Social Determinants of Health: Lives at SNF  Disposition:  Signed out pending labs, reeval.  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   Hyperlipidemia    Hypertension    TIA (transient ischemic attack)    Type 2 diabetes mellitus (HCC)      Medicines Meds ordered this encounter  Medications   cyclobenzaprine (FLEXERIL) tablet 10 mg   oxyCODONE (Oxy IR/ROXICODONE) immediate release tablet 5 mg   sodium chloride 0.9 % bolus 1,000 mL    I have reviewed the patients home medicines and have made adjustments as needed  Problem List / ED Course: Problem List Items Addressed This Visit       Other   Syncope   Fall - Primary   Other Visit Diagnoses     Neck pain, acute  This note was created using dictation software, which may contain spelling or grammatical errors.    Loetta Rough, MD 07/30/23 [phone]

## 2023-07-31 ENCOUNTER — Telehealth: Payer: Self-pay | Admitting: *Deleted

## 2023-07-31 NOTE — Telephone Encounter (Signed)
Chris [name], From [facility] called regarding pt AVS stating pt should have C-Collar applied but arrived to facility without one.  RNCM read notes by EDP stating pt felt better without it.  Belinda requested notes and results of CT Scan.

## 2023-11-11 ENCOUNTER — Other Ambulatory Visit: Payer: Self-pay | Admitting: Foot & Ankle Surgery

## 2023-11-11 DIAGNOSIS — E11621 Type 2 diabetes mellitus with foot ulcer: Secondary | ICD-10-CM

## 2023-11-13 ENCOUNTER — Other Ambulatory Visit: Payer: Medicare Other

## 2023-11-14 ENCOUNTER — Other Ambulatory Visit: Payer: Medicare Other
